# Patient Record
Sex: Male | Born: 1945 | Race: White | Hispanic: No | Smoking: Former smoker
Health system: Southern US, Community
[De-identification: ages and names within clinical notes are randomized; demographics above are authoritative.]

## PROBLEM LIST (undated history)

## (undated) DIAGNOSIS — E785 Hyperlipidemia, unspecified: Secondary | ICD-10-CM

## (undated) DIAGNOSIS — T8859XA Other complications of anesthesia, initial encounter: Secondary | ICD-10-CM

## (undated) DIAGNOSIS — M1990 Unspecified osteoarthritis, unspecified site: Secondary | ICD-10-CM

## (undated) HISTORY — DX: Hyperlipidemia, unspecified: E78.5

## (undated) HISTORY — PX: COLONOSCOPY: SHX174

## (undated) HISTORY — DX: Unspecified osteoarthritis, unspecified site: M19.90

---

## 1989-01-03 HISTORY — PX: NECK SURGERY: SHX720

## 2003-08-14 ENCOUNTER — Ambulatory Visit (HOSPITAL_COMMUNITY): Admission: RE | Admit: 2003-08-14 | Discharge: 2003-08-14 | Payer: Self-pay | Admitting: Gastroenterology

## 2014-02-24 ENCOUNTER — Telehealth: Payer: Self-pay | Admitting: Family Medicine

## 2014-02-24 NOTE — Telephone Encounter (Signed)
Appointment given for tomorrow with Mary Martin, FNP.  

## 2014-02-25 ENCOUNTER — Encounter: Payer: Self-pay | Admitting: Nurse Practitioner

## 2014-02-25 ENCOUNTER — Ambulatory Visit: Payer: PPO

## 2014-02-25 ENCOUNTER — Ambulatory Visit (INDEPENDENT_AMBULATORY_CARE_PROVIDER_SITE_OTHER): Payer: PPO | Admitting: Nurse Practitioner

## 2014-02-25 VITALS — BP 145/85 | HR 92 | Temp 97.6°F | Ht 73.0 in | Wt 247.4 lb

## 2014-02-25 DIAGNOSIS — G629 Polyneuropathy, unspecified: Secondary | ICD-10-CM

## 2014-02-25 DIAGNOSIS — M7661 Achilles tendinitis, right leg: Secondary | ICD-10-CM

## 2014-02-25 MED ORDER — GABAPENTIN 300 MG PO CAPS: 300.0000 mg | ORAL_CAPSULE | Freq: Every day | ORAL | Status: DC

## 2014-02-25 NOTE — Patient Instructions (Signed)
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
## 2014-09-15 ENCOUNTER — Other Ambulatory Visit: Payer: Self-pay | Admitting: Chiropractic Medicine

## 2014-09-15 DIAGNOSIS — M79602 Pain in left arm: Secondary | ICD-10-CM

## 2014-09-15 DIAGNOSIS — M542 Cervicalgia: Secondary | ICD-10-CM

## 2014-09-26 ENCOUNTER — Ambulatory Visit
Admission: RE | Admit: 2014-09-26 | Discharge: 2014-09-26 | Disposition: A | Discharge status: Home / Self Care | Payer: PPO | Source: Ambulatory Visit | Attending: Chiropractic Medicine | Admitting: Chiropractic Medicine

## 2014-09-26 DIAGNOSIS — M79602 Pain in left arm: Secondary | ICD-10-CM

## 2014-09-26 DIAGNOSIS — M542 Cervicalgia: Secondary | ICD-10-CM

## 2014-10-02 NOTE — Progress Notes (Signed)
Subjective:  Patient ID: Fernando Pratt, male    DOB: [birth year]  Age: 69 y.o. MRN: [id number]  CC: Back Pain   HPI Fernando Pratt presents for recent MRI of the cervical spine. This shows a C5-6 and C6-C7 cervical fusion. Apparently performed in 1991. It also shows  mild cervical spinal stenosis and severe bilateral C4 and moderate to severe bilateral C5 neural foraminal stenosis. Also has a right lumbar radiculopathy Pain primarily at left upper shoulder, then shoulder blades. KNees also hurt. Feeling fragile  History Fernando Pratt has no past medical history on file.   He has past surgical history that includes Neck surgery (1991).   His family history is not on file.He reports that he has never smoked. He does not have any smokeless tobacco history on file. He reports that he does not drink alcohol or use illicit drugs.  Outpatient Prescriptions Prior to Visit  Medication Sig Dispense Refill  . gabapentin (NEURONTIN) 300 MG capsule Take 1 capsule (300 mg total) by mouth at bedtime. (Patient not taking: Reported on 10/03/2014) 30 capsule 1   No facility-administered medications prior to visit.    ROS Review of Systems  Constitutional: Negative for fever, chills and diaphoresis.  HENT: Negative for congestion, rhinorrhea and sore throat.   Respiratory: Negative for cough, shortness of breath and wheezing.   Cardiovascular: Negative for chest pain.  Gastrointestinal: Negative for nausea, vomiting, abdominal pain, diarrhea, constipation and abdominal distention.  Genitourinary: Negative for dysuria and frequency.  Musculoskeletal: Negative for joint swelling and arthralgias.  Skin: Negative for rash.  Neurological: Negative for headaches.  All other systems reviewed and are negative.   Objective:  BP 134/87 mmHg  Pulse 88  Temp(Src) 96 F (35.6 C) (Oral)  Ht  (1.854 m)  Wt 238 lb 3.2 oz (108.047 kg)  BMI 31.43 kg/m2  BP Readings from Last 3 Encounters:  10/03/14  134/87  02/25/14 145/85    Wt Readings from Last 3 Encounters:  10/03/14 238 lb 3.2 oz (108.047 kg)  02/25/14 247 lb 6.4 oz (112.22 kg)     Physical Exam  Constitutional: He is oriented to person, place, and time. He appears well-developed and well-nourished. No distress.  HENT:  Head: Normocephalic and atraumatic.  Right Ear: External ear normal.  Left Ear: External ear normal.  Nose: Nose normal.  Mouth/Throat: Oropharynx is clear and moist.  Eyes: Conjunctivae and EOM are normal. Pupils are equal, round, and reactive to light.  Neck: Normal range of motion. Neck supple. No thyromegaly present.  Cardiovascular: Normal rate, regular rhythm and normal heart sounds.   No murmur heard. Pulmonary/Chest: Effort normal and breath sounds normal. No respiratory distress. He has no wheezes. He has no rales.  Abdominal: Soft. Bowel sounds are normal. He exhibits no distension. There is no tenderness.  Lymphadenopathy:    He has no cervical adenopathy.  Neurological: He is alert and oriented to person, place, and time. He has normal reflexes.  Skin: Skin is warm and dry.  Psychiatric: He has a normal mood and affect. His behavior is normal. Judgment and thought content normal.    No results found for: HGBA1C  No results found for: WBC, HGB, HCT, PLT, GLUCOSE, CHOL, TRIG, HDL, LDLDIRECT, LDLCALC, ALT, AST, NA, K, CL, CREATININE, BUN, CO2, TSH, PSA, INR, GLUF, HGBA1C, MICROALBUR  Mr Cervical Spine Wo Contrast  09/26/2014   CLINICAL DATA:  69 year old male with increasing cervical neck pain radiating to the left arm with bilateral upper  extremity weakness. Symptoms for 3 months with no known injury. Remote spine surgery in the 1990s.  EXAM: MRI CERVICAL SPINE WITHOUT CONTRAST  TECHNIQUE: Multiplanar, multisequence MR imaging of the cervical spine was performed. No intravenous contrast was administered.  COMPARISON:  None.  FINDINGS: Previous fusion of the C5 through C7 cervical vertebrae.  Focal lordosis at C4-C5. No marrow edema or evidence of acute osseous abnormality.  Cervicomedullary junction is within normal limits. No cervical spinal cord signal abnormality identified.  Dominant left vertebral artery. No marrow edema or evidence of acute osseous abnormality.  C2-C3: Moderate facet hypertrophy on the right. Mild broad-based central disc protrusion or disc osteophyte complex. Borderline to mild spinal stenosis. No significant foraminal stenosis.  C3-C4: Right eccentric circumferential disc osteophyte complex with moderate facet hypertrophy greater on the right. Spinal stenosis with no cord mass effect. Severe bilateral C4 foraminal stenosis.  C4-C5: Left eccentric circumferential disc osteophyte complex. Moderate facet hypertrophy greater on the right. Spinal stenosis with no cord mass effect. Severe left and moderate right C5 foraminal stenosis.  C5-C6:  Previous fusion.  No significant stenosis.  C6-C7: Previous fusion. Residual uncovertebral hypertrophy and mild to moderate C7 foraminal stenosis.  C7-T1: Anterior eccentric disc osteophyte complex. Mild facet and uncovertebral hypertrophy. Mild right greater than left C8 foraminal stenosis.  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  IMPRESSION: 1. Previous C5-C6 and C6-C7 cervical fusion. 2. Multifactorial mild spinal stenosis with no cord mass effect from C2-C3 to C4-C5. 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   Electronically Signed   By: Odessa Fleming M.D.   On: 09/26/2014 20:18    Assessment & Plan:   [name] was seen today for back pain.  Diagnoses and all orders for this visit:  Pain in joint, shoulder region, left -     Ambulatory referral to Orthopedic Surgery -     Ambulatory referral to Physical Therapy  Knee pain, bilateral -     Ambulatory referral to Orthopedic Surgery -     Ambulatory referral to Physical Therapy  Other  orders -     celecoxib (CELEBREX) 200 MG capsule; Take 1 capsule (200 mg total) by mouth daily. With food -     traZODone (DESYREL) 150 MG tablet; Take 1 tablet (150 mg total) by mouth at bedtime.  I have discontinued Mr. Hilligoss's gabapentin. I am also having him start on celecoxib and traZODone.  Meds ordered this encounter  Medications  . celecoxib (CELEBREX) 200 MG capsule    Sig: Take 1 capsule (200 mg total) by mouth daily. With food    Dispense:  30 capsule    Refill:  5  . traZODone (DESYREL) 150 MG tablet    Sig: Take 1 tablet (150 mg total) by mouth at bedtime.    Dispense:  30 tablet    Refill:  2     Follow-up: Return in about 1 month (around 11/02/2014) for Arthritis, Pain.  Mechele Claude, M.D.

## 2014-10-03 ENCOUNTER — Ambulatory Visit (INDEPENDENT_AMBULATORY_CARE_PROVIDER_SITE_OTHER): Payer: PPO | Admitting: Family Medicine

## 2014-10-03 ENCOUNTER — Encounter: Payer: Self-pay | Admitting: Family Medicine

## 2014-10-03 VITALS — BP 134/87 | HR 88 | Temp 96.0°F | Ht 73.0 in | Wt 238.2 lb

## 2014-10-03 DIAGNOSIS — M25512 Pain in left shoulder: Secondary | ICD-10-CM

## 2014-10-03 DIAGNOSIS — M25562 Pain in left knee: Secondary | ICD-10-CM

## 2014-10-03 DIAGNOSIS — M25561 Pain in right knee: Secondary | ICD-10-CM

## 2014-10-03 MED ORDER — TRAZODONE HCL 150 MG PO TABS: 150.0000 mg | ORAL_TABLET | Freq: Every day | ORAL | Status: DC

## 2014-10-03 MED ORDER — CELECOXIB 200 MG PO CAPS: 200.0000 mg | ORAL_CAPSULE | Freq: Every day | ORAL | Status: DC

## 2014-11-03 ENCOUNTER — Ambulatory Visit (INDEPENDENT_AMBULATORY_CARE_PROVIDER_SITE_OTHER): Payer: PPO | Admitting: Family Medicine

## 2014-11-03 ENCOUNTER — Encounter: Payer: Self-pay | Admitting: Family Medicine

## 2014-11-03 VITALS — BP 137/78 | HR 84 | Temp 96.7°F | Ht 73.0 in | Wt 236.1 lb

## 2014-11-03 DIAGNOSIS — E785 Hyperlipidemia, unspecified: Secondary | ICD-10-CM

## 2014-11-03 DIAGNOSIS — M4712 Other spondylosis with myelopathy, cervical region: Secondary | ICD-10-CM

## 2014-11-03 DIAGNOSIS — M25561 Pain in right knee: Secondary | ICD-10-CM

## 2014-11-03 DIAGNOSIS — M25562 Pain in left knee: Secondary | ICD-10-CM | POA: Diagnosis not present

## 2014-11-03 DIAGNOSIS — Z23 Encounter for immunization: Secondary | ICD-10-CM | POA: Diagnosis not present

## 2014-11-03 DIAGNOSIS — G629 Polyneuropathy, unspecified: Secondary | ICD-10-CM | POA: Diagnosis not present

## 2014-11-03 DIAGNOSIS — M25512 Pain in left shoulder: Secondary | ICD-10-CM | POA: Diagnosis not present

## 2014-11-03 MED ORDER — CELECOXIB 400 MG PO CAPS: 400.0000 mg | ORAL_CAPSULE | Freq: Every day | ORAL | Status: DC

## 2014-11-03 MED ORDER — GABAPENTIN 300 MG PO CAPS: 600.0000 mg | ORAL_CAPSULE | Freq: Two times a day (BID) | ORAL | Status: DC

## 2014-11-03 NOTE — Patient Instructions (Signed)
Start gabapentin with 1 capsule at bedtime. Increase every third or fourth day by one capsule as able monitoring for drowsiness the following morning.   Try to change the dose to 2 capsules twice a day once you are tolerating for at bedtime

## 2014-11-03 NOTE — Progress Notes (Signed)
Subjective:  Patient ID: Fernando Pratt, male    DOB: Nov 14, 1945  Age: 69 y.o. 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 Arms have no strength. Very sore both arms. Shaky with activity. Denies neck pain. Can't sleep due to knee and shoulder pain bilaterally. Can't tighten screws. Saw neurosurg who said he did not need neck surgery. Ortho, Dr. Onnie Graham wants to do NCV. Did shoulder injections that didn't help. Pain at superior to posterior trapezius region. Rated moderately severe. Stable, but not improving.  History [name] has no past medical history on file.   He has past surgical history that includes Neck surgery (1991).   His family history is not on file.He reports that he has never smoked. He does not have any smokeless tobacco history on file. He reports that he does not drink alcohol or use illicit drugs.  Outpatient Prescriptions Prior to Visit  Medication Sig Dispense Refill  . celecoxib (CELEBREX) 200 MG capsule Take 1 capsule (200 mg total) by mouth daily. With food 30 capsule 5  . traZODone (DESYREL) 150 MG tablet Take 1 tablet (150 mg total) by mouth at bedtime. (Patient not taking: Reported on 11/03/2014) 30 tablet 2   No facility-administered medications prior to visit.    ROS Review of Systems  Objective:  BP 137/78 mmHg  Pulse 84  Temp(Src) 96.7 F (35.9 C) (Oral)  Ht 6' 1" (1.854 m)  Wt 236 lb 1.6 oz (107.094 kg)  BMI 31.16 kg/m2  BP Readings from Last 3 Encounters:  11/03/14 137/78  10/03/14 134/87  02/25/14 145/85    Wt Readings from Last 3 Encounters:  11/03/14 236 lb 1.6 oz (107.094 kg)  10/03/14 238 lb 3.2 oz (108.047 kg)  02/25/14 247 lb 6.4 oz (112.22 kg)     Physical Exam  No results found for: HGBA1C  No results found for: WBC, HGB, HCT, PLT, GLUCOSE, CHOL, TRIG, HDL, LDLDIRECT, LDLCALC, ALT,  AST, NA, K, CL, CREATININE, BUN, CO2, TSH, PSA, INR, GLUF, HGBA1C, MICROALBUR  Mr Cervical Spine Wo Contrast  09/26/2014  CLINICAL DATA:  69 year old male with increasing cervical neck pain radiating to the left arm with bilateral upper extremity weakness. Symptoms for 3 months with no known injury. Remote spine surgery in the 1990s. EXAM: MRI CERVICAL SPINE WITHOUT CONTRAST TECHNIQUE: Multiplanar, multisequence MR imaging of the cervical spine was performed. No intravenous contrast was administered. COMPARISON:  None. FINDINGS: Previous fusion of the C5 through C7 cervical vertebrae. Focal lordosis at C4-C5. No marrow edema or evidence of acute osseous abnormality. Cervicomedullary junction is within normal limits. No cervical spinal cord signal abnormality identified. Dominant left vertebral artery. No marrow edema or evidence of acute osseous abnormality. C2-C3: Moderate facet hypertrophy on the right. Mild broad-based central disc protrusion or disc osteophyte complex. Borderline to mild spinal stenosis. No significant foraminal stenosis. C3-C4: Right eccentric circumferential disc osteophyte complex with moderate facet hypertrophy greater on the right. Spinal stenosis with no cord mass effect. Severe bilateral C4 foraminal stenosis. C4-C5: Left eccentric circumferential disc osteophyte complex. Moderate facet hypertrophy greater on the right. Spinal stenosis with no cord mass effect. Severe left and moderate right C5 foraminal stenosis. C5-C6:  Previous fusion.  No significant stenosis. C6-C7: Previous fusion. Residual uncovertebral hypertrophy and mild to moderate C7 foraminal stenosis. C7-T1: Anterior eccentric disc osteophyte complex. Mild facet and uncovertebral hypertrophy. Mild right  greater than left C8 foraminal stenosis. 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 IMPRESSION: 1. Previous C5-C6 and C6-C7 cervical fusion. 2. Multifactorial mild spinal  stenosis with no cord mass effect from C2-C3 to C4-C5. 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 Electronically Signed   By: Genevie Ann M.D.   On: 09/26/2014 20:18    Assessment & Plan:   [name] was seen today for shoulder pain.  Diagnoses and all orders for this visit:  Pain in joint, shoulder region, left -     CBC with Differential/Platelet -     CMP14+EGFR -     Sedimentation rate -     Ambulatory referral to Physical Therapy  Knee pain, bilateral -     CBC with Differential/Platelet -     CMP14+EGFR -     Sedimentation rate -     Ambulatory referral to Physical Therapy  Neuropathy (HCC) -     CBC with Differential/Platelet -     CMP14+EGFR -     Sedimentation rate  Spondylosis, cervical, with myelopathy -     CBC with Differential/Platelet -     CMP14+EGFR -     Sedimentation rate -     Ambulatory referral to Physical Therapy  Hyperlipidemia -     Lipid panel  Other orders -     gabapentin (NEURONTIN) 300 MG capsule; Take 2 capsules (600 mg total) by mouth 2 (two) times daily. -     celecoxib (CELEBREX) 400 MG capsule; Take 1 capsule (400 mg total) by mouth daily. With food   I have changed Mr. Serda's celecoxib. I am also having him start on gabapentin. Additionally, I am having him maintain his traZODone.  Meds ordered this encounter  Medications  . gabapentin (NEURONTIN) 300 MG capsule    Sig: Take 2 capsules (600 mg total) by mouth 2 (two) times daily.    Dispense:  120 capsule    Refill:  1  . celecoxib (CELEBREX) 400 MG capsule    Sig: Take 1 capsule (400 mg total) by mouth daily. With food    Dispense:  30 capsule    Refill:  5     Follow-up: Return in about 6 weeks (around 12/15/2014) for Arthritis, Pain.  Claretta Fraise, M.D.

## 2014-11-04 LAB — CMP14+EGFR
ALBUMIN: 4 g/dL (ref 3.6–4.8)
ALK PHOS: 68 IU/L (ref 39–117)
ALT: 14 IU/L (ref 0–44)
AST: 13 IU/L (ref 0–40)
Albumin/Globulin Ratio: 1.7 (ref 1.1–2.5)
BILIRUBIN TOTAL: 0.6 mg/dL (ref 0.0–1.2)
BUN / CREAT RATIO: 19 (ref 10–22)
BUN: 17 mg/dL (ref 8–27)
CHLORIDE: 101 mmol/L (ref 97–106)
CO2: 24 mmol/L (ref 18–29)
CREATININE: 0.9 mg/dL (ref 0.76–1.27)
Calcium: 8.9 mg/dL (ref 8.6–10.2)
GFR calc Af Amer: 100 mL/min/{1.73_m2} (ref >59)
GFR calc non Af Amer: 87 mL/min/{1.73_m2} (ref >59)
GLUCOSE: 112 mg/dL — AB (ref 65–99)
Globulin, Total: 2.3 g/dL (ref 1.5–4.5)
Potassium: 4.6 mmol/L (ref 3.5–5.2)
Sodium: 140 mmol/L (ref 136–144)
Total Protein: 6.3 g/dL (ref 6.0–8.5)

## 2014-11-04 LAB — CBC WITH DIFFERENTIAL/PLATELET
BASOS ABS: 0 10*3/uL (ref 0.0–0.2)
Basos: 0 %
EOS (ABSOLUTE): 0.1 10*3/uL (ref 0.0–0.4)
Eos: 2 %
Hematocrit: 39.7 % (ref 37.5–51.0)
Hemoglobin: 13.3 g/dL (ref 12.6–17.7)
Immature Grans (Abs): 0 10*3/uL (ref 0.0–0.1)
Immature Granulocytes: 0 %
LYMPHS ABS: 0.9 10*3/uL (ref 0.7–3.1)
Lymphs: 12 %
MCH: 28.4 pg (ref 26.6–33.0)
MCHC: 33.5 g/dL (ref 31.5–35.7)
MCV: 85 fL (ref 79–97)
MONOCYTES: 8 %
MONOS ABS: 0.7 10*3/uL (ref 0.1–0.9)
NEUTROS ABS: 6.1 10*3/uL (ref 1.4–7.0)
Neutrophils: 78 %
PLATELETS: 161 10*3/uL (ref 150–379)
RBC: 4.68 x10E6/uL (ref 4.14–5.80)
RDW: 13.3 % (ref 12.3–15.4)
WBC: 7.9 10*3/uL (ref 3.4–10.8)

## 2014-11-04 LAB — SEDIMENTATION RATE: SED RATE: 11 mm/h (ref 0–30)

## 2014-11-04 LAB — LIPID PANEL
CHOLESTEROL TOTAL: 192 mg/dL (ref 100–199)
Chol/HDL Ratio: 4.2 ratio units (ref 0.0–5.0)
HDL: 46 mg/dL (ref >39)
LDL Calculated: 133 mg/dL — ABNORMAL HIGH (ref 0–99)
Triglycerides: 64 mg/dL (ref 0–149)
VLDL CHOLESTEROL CAL: 13 mg/dL (ref 5–40)

## 2014-11-04 MED ORDER — ATORVASTATIN CALCIUM 40 MG PO TABS: 40.0000 mg | ORAL_TABLET | Freq: Every day | ORAL | Status: DC

## 2014-11-04 NOTE — Addendum Note (Signed)
Addended by: Mechele ClaudeSTACKS, Aaryanna Hyden on: 11/04/2014 06:31 PM   Modules accepted: Kipp BroodSmartSet

## 2014-11-04 NOTE — Addendum Note (Signed)
Addended by: Tamera PuntWRAY, Shaquanna Lycan S on: 11/04/2014 01:22 PM   Modules accepted: Orders

## 2014-11-10 ENCOUNTER — Encounter: Payer: Self-pay | Admitting: Physical Therapy

## 2014-11-10 ENCOUNTER — Ambulatory Visit: Payer: PPO | Attending: Family Medicine | Admitting: Physical Therapy

## 2014-11-10 DIAGNOSIS — M25512 Pain in left shoulder: Secondary | ICD-10-CM | POA: Insufficient documentation

## 2014-11-10 DIAGNOSIS — M25511 Pain in right shoulder: Secondary | ICD-10-CM | POA: Insufficient documentation

## 2014-11-10 DIAGNOSIS — M25611 Stiffness of right shoulder, not elsewhere classified: Secondary | ICD-10-CM | POA: Insufficient documentation

## 2014-11-10 DIAGNOSIS — R29898 Other symptoms and signs involving the musculoskeletal system: Secondary | ICD-10-CM

## 2014-11-10 DIAGNOSIS — R6889 Other general symptoms and signs: Secondary | ICD-10-CM

## 2014-11-10 DIAGNOSIS — M25612 Stiffness of left shoulder, not elsewhere classified: Secondary | ICD-10-CM | POA: Diagnosis present

## 2014-11-10 NOTE — Therapy (Signed)
[facility] [address] [place], [place], [address] Phone: [phone]   Fax:  [phone]  Physical Therapy Evaluation  Patient Details  Name: Fernando Pratt MRN: [id number] Date of Birth: [birth year] Referring Provider: Mechele ClaudeWarren Stacks, MD  Encounter Date: 11/10/2014      PT End of Session - 11/10/14 1349    Visit Number 1   Number of Visits 16   Date for PT Re-Evaluation 01/05/15   PT Start Time 1349   PT Stop Time 1430   PT Time Calculation (min) 41 min   Activity Tolerance Patient tolerated treatment well   Behavior [facility] Therapy [facility] for tasks assessed/performed      History reviewed. No pertinent past medical history.  Past Surgical History  Procedure Laterality Date  . Neck surgery  1991    There were no vitals filed for this visit.  Visit Diagnosis:  Bilateral shoulder pain - Plan: PT plan of care cert/re-cert  Stiffness of shoulder joint, left - Plan: PT plan of care cert/re-cert  Stiffness of shoulder joint, right - Plan: PT plan of care cert/re-cert  Weakness of both arms - Plan: PT plan of care cert/re-cert  Activity intolerance - Plan: PT plan of care cert/re-cert      Subjective Assessment - 11/10/14 1347    Subjective Patient began experiencing Bshoulder pain (L > R) beginning 4 months ago. Also reports feeling jittery in his entire body as well as shaking in BUE. Patient states Dr. Aundra MilletNeudlelman doesn't think the neck is causing his shoulder pain. Saw Dr. Rennis ChrisSupple about a monthe ago and had a shot in B shoulders which eliminated pain and improved movement for 3 days.   Pertinent History C5/6 and C6/7 fusion   Diagnostic tests MRI 09/26/14: C5/6/7 fusion; severe B C4 and mod to severe B C5 neural foraminal stenosis   Currently in Pain? 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   Multiple Pain Sites Yes   Pain Score 5   Pain Location Knee   Pain Orientation Left  L constant; R intermittent   Pain Descriptors / Indicators Aching   Pain Type Chronic pain   Pain Onset More than a month ago   Pain Frequency Intermittent   Aggravating Factors  squatting   Pain Relieving Factors bengay   Effect of Pain on Daily Activities difficulty working            [facility] - 11/10/14 0001    Assessment   Medical Diagnosis L shoulder pain; B knee pain   Referring Provider Mechele ClaudeWarren Stacks, MD   Onset Date/Surgical Date 07/10/14   Next MD Visit 3 weeks   Prior Therapy none   Precautions   Precaution Comments --   Balance Screen   Has the patient fallen in the past 6 months No   Has the patient had a decrease in activity level because of a fear of falling?  Yes   Is the patient reluctant to leave their home because of a fear of falling?  No   Prior Function   Vocation Part time employment  does  what he can; limited by pain   Observation/Other Assessments   Focus on Therapeutic Outcomes (FOTO)  57% limitation   Posture/Postural Control   Posture Comments rounded shoulders, forward head   ROM / Strength   AROM / PROM / Strength AROM;PROM;Strength   AROM   AROM Assessment Site Shoulder   Right/Left Shoulder Right;Left   Right Shoulder Flexion 155 Degrees   Right Shoulder ABduction 160 Degrees   Right Shoulder Internal Rotation --  to top of buttock   Right Shoulder External Rotation 54 Degrees   Left Shoulder Flexion 127 Degrees   Left Shoulder ABduction 90 Degrees   Left Shoulder Internal Rotation --  to top of buttock   Left Shoulder External Rotation 35 Degrees   PROM   PROM Assessment Site Shoulder   Right/Left Shoulder Right;Left   Right Shoulder Flexion 162 Degrees    Right Shoulder ABduction --  full   Right Shoulder External Rotation 70 Degrees   Left Shoulder Flexion 142 Degrees   Left Shoulder ABduction 120 Degrees   Left Shoulder External Rotation 40 Degrees   Strength   Overall Strength Comments L grip 1.75 kg ; R .05 Kg; B knees 5/5; B hip flex 5/5   Strength Assessment Site Shoulder   Right/Left Shoulder Right;Left   Right Shoulder Flexion 5/5   Right Shoulder Extension 5/5   Right Shoulder ABduction 4-/5  pain   Right Shoulder Internal Rotation 5/5   Right Shoulder External Rotation 4+/5   Left Shoulder Flexion 4+/5   Left Shoulder Extension 5/5   Left Shoulder ABduction 4-/5  pain   Left Shoulder Internal Rotation 5/5   Left Shoulder External Rotation 4+/5   Palpation   Palpation comment anterior shoulders B   Special Tests    Special Tests Rotator Cuff Impingement   Rotator Cuff Impingment tests Leanord Asal test;Neer impingement test;Lift- off test   Neer Impingement test    Findings Positive   Side Left   Hawkins-Kennedy test   Findings Positive   Side Left   Lift-Off test   Findings Positive   Side Left                           PT Education - 11/10/14 1543    Education provided Yes   Education Details HEP   Person(s) Educated Patient   Methods Demonstration;Explanation;Handout   Comprehension Verbalized understanding;Returned demonstration          PT Short Term Goals - 11/10/14 1544    PT SHORT TERM GOAL #1   Title I with initial HEP 11/24/14   Time 2   Period Weeks   Status New           PT Long Term Goals - 11/10/14 1545    PT LONG TERM GOAL #1   Title I with advanced HEP   Time 8   Period Weeks   Status New   PT LONG TERM GOAL #2   Title Improved L shoulder ROM to [facility] to peform ADLs   Time 8   Period Weeks   Status New   PT LONG TERM GOAL #3   Title Improved shoulder strength to 5-/5 B   Time 8   Period Weeks   Status New   PT LONG TERM GOAL #4   Title  decreased pain with ADLS to 3/10 or less in B shoulders   PT LONG TERM GOAL #5   Title improved grip  strength by 20# B   Time 8   Period Weeks               Plan - November 19, 2014 1434    Clinical Impression Statement Patient is a 69 year old male with insidious onset 4 months ago of B shoulder pain L > R. His shoulder pain has been ruled out as being neurological by his neurosurgeon. He has limited ROM and strength especially his grip strength which is minimal B. He also reports B knee pain.   Pt will benefit from skilled therapeutic intervention in order to improve on the following deficits Decreased range of motion;Impaired UE functional use;Decreased activity tolerance;Pain;Postural dysfunction;Decreased strength   Rehab Potential Good   Clinical Impairments Affecting Rehab Potential weak grip strength   PT Frequency 2x / week   PT Duration 8 weeks   PT Treatment/Interventions ADLs/Self Care Home Management;Cryotherapy;Electrical Stimulation;Moist Heat;Therapeutic exercise;Ultrasound;Neuromuscular re-education;Patient/family education;Manual techniques;Vasopneumatic Device;Passive range of motion   PT Next Visit Plan Review cane exercises, add shoulder and scapular strengthening, grip strength.   PT Home Exercise Plan supine cane flex, er, chest press   Consulted and Agree with Plan of Care Patient          G-Codes - November 19, 2014 1549    Functional Assessment Tool Used FOTO 57% Limited   Functional Limitation Other PT primary   Other PT Primary Current Status (A5409) At least 40 percent but less than 60 percent impaired, limited or restricted   Other PT Primary Goal Status (W1191) At least 20 percent but less than 40 percent impaired, limited or restricted       Problem List There are no active problems to display for this patient.   Solon Palm PT  2014/11/19, 3:53 PM  [facility] [address] [place], [place], [address] Phone:  [phone]   Fax:  [phone]  Name: Fernando Pratt MRN: [id number] Date of Birth: 05/08/1945

## 2014-11-14 ENCOUNTER — Encounter: Payer: Self-pay | Admitting: *Deleted

## 2014-11-14 ENCOUNTER — Ambulatory Visit: Payer: PPO | Admitting: *Deleted

## 2014-11-14 DIAGNOSIS — R6889 Other general symptoms and signs: Secondary | ICD-10-CM

## 2014-11-14 DIAGNOSIS — M25511 Pain in right shoulder: Secondary | ICD-10-CM | POA: Diagnosis not present

## 2014-11-14 DIAGNOSIS — M25611 Stiffness of right shoulder, not elsewhere classified: Secondary | ICD-10-CM

## 2014-11-14 DIAGNOSIS — R29898 Other symptoms and signs involving the musculoskeletal system: Secondary | ICD-10-CM

## 2014-11-14 DIAGNOSIS — M25512 Pain in left shoulder: Principal | ICD-10-CM

## 2014-11-14 DIAGNOSIS — M25612 Stiffness of left shoulder, not elsewhere classified: Secondary | ICD-10-CM

## 2014-11-14 NOTE — Therapy (Signed)
[facility] [address] [place], [place], [address] Phone: [phone]   Fax:  [phone]  Physical Therapy Treatment  Patient Details  Name: Fernando Pratt MRN: [id number] Date of Birth: 17-Nov-1945 Referring Provider: Mechele ClaudeWarren Stacks, MD  Encounter Date: 11/14/2014      PT End of Session - 11/14/14 0931    Visit Number 2   Number of Visits 16   Date for PT Re-Evaluation 01/05/15   PT Start Time 0815   PT Stop Time 0905   PT Time Calculation (min) 50 min      History reviewed. No pertinent past medical history.  Past Surgical History  Procedure Laterality Date  . Neck surgery  1991    There were no vitals filed for this visit.  Visit Diagnosis:  Bilateral shoulder pain  Stiffness of shoulder joint, left  Stiffness of shoulder joint, right  Weakness of both arms  Activity intolerance      Subjective Assessment - 11/14/14 0820    Subjective Patient began experiencing Bshoulder pain (L > R) beginning 4 months ago. Also reports feeling jittery in his entire body as well as shaking in BUE. Patient states Dr. Aundra MilletNeudlelman doesn't think the neck is causing his shoulder pain. Saw Dr. Rennis ChrisSupple about a monthe ago and had a shot in B shoulders which eliminated pain and improved movement for 3 days.   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 Yes   Pain Score 8    Pain Orientation Right;Left   Pain Onset More than a month ago   Pain Frequency Constant                         [facility] Adult PT Treatment/Exercise - 11/14/14 0001    Exercises   Exercises Shoulder   Shoulder Exercises: Supine   Other Supine Exercises supine cane exs. : press up, flexion, and er stretch   Shoulder Exercises: Standing   External Rotation Strengthening;Both;20 reps   Theraband Level (Shoulder  External Rotation) Level 1 (Yellow)   Modalities   Modalities Electrical Stimulation   Electrical Stimulation   Electrical Stimulation Location BIL. shldrs premod x 15 mins 80-150 hz supine   Electrical Stimulation Goals Pain   Manual Therapy   Manual Therapy Passive ROM   Passive ROM PROM to BIL. shldrs for ER f/b Rhythmic stab in supine                  PT Short Term Goals - 11/10/14 1544    PT SHORT TERM GOAL #1   Title I with initial HEP 11/24/14   Time 2   Period Weeks   Status New           PT Long Term Goals - 11/10/14 1545    PT LONG TERM GOAL #1   Title I with advanced HEP   Time 8   Period Weeks   Status New   PT LONG TERM GOAL #2   Title Improved L shoulder ROM to [facility] to peform ADLs   Time 8   Period Weeks   Status New   PT LONG TERM GOAL #3   Title Improved shoulder strength to 5-/5 B   Time 8   Period Weeks   Status New   PT LONG TERM GOAL #4  Title decreased pain with ADLS to 3/10 or less in B shoulders   PT LONG TERM GOAL #5   Title improved grip strength by 20# B   Time 8   Period Weeks               Plan - 11/14/14 1006    Clinical Impression Statement Pt did fairly well with Exs and manual Therapy. He was able to perform Tband Er with minimal pain increase and hold on to the handle ok (weak grip). Reviewed Cane exs with Pt and had to modify Er stretch by putting two pillows under elbows for a better shldr position. Goals are on-going    Pt will benefit from skilled therapeutic intervention in order to improve on the following deficits Decreased range of motion;Impaired UE functional use;Decreased activity tolerance;Pain;Postural dysfunction;Decreased strength   Clinical Impairments Affecting Rehab Potential weak grip strength   PT Frequency 2x / week   PT Duration 8 weeks   PT Treatment/Interventions ADLs/Self Care Home Management;Cryotherapy;Electrical Stimulation;Moist Heat;Therapeutic exercise;Ultrasound;Neuromuscular  re-education;Patient/family education;Manual techniques;Vasopneumatic Device;Passive range of motion   PT Next Visit Plan Review cane exercises, add shoulder and scapular strengthening, grip strength.   PT Home Exercise Plan supine cane flex, er, chest press   Consulted and Agree with Plan of Care Patient        Problem List There are no active problems to display for this patient.   Fernando Pratt,Fernando Pratt, PTA 11/14/2014, 10:17 AM  [facility] [address] [place], [place], [address] Phone: [phone]   Fax:  [phone]  Name: Fernando Pratt MRN: [id number] Date of Birth: 17-Nov-1945

## 2014-11-17 ENCOUNTER — Encounter: Payer: PPO | Admitting: Physical Therapy

## 2014-11-19 ENCOUNTER — Ambulatory Visit: Payer: PPO | Admitting: Physical Therapy

## 2014-11-19 DIAGNOSIS — M25612 Stiffness of left shoulder, not elsewhere classified: Secondary | ICD-10-CM

## 2014-11-19 DIAGNOSIS — M25611 Stiffness of right shoulder, not elsewhere classified: Secondary | ICD-10-CM

## 2014-11-19 DIAGNOSIS — M25511 Pain in right shoulder: Secondary | ICD-10-CM

## 2014-11-19 DIAGNOSIS — R6889 Other general symptoms and signs: Secondary | ICD-10-CM

## 2014-11-19 DIAGNOSIS — M25512 Pain in left shoulder: Principal | ICD-10-CM

## 2014-11-19 DIAGNOSIS — R29898 Other symptoms and signs involving the musculoskeletal system: Secondary | ICD-10-CM

## 2014-11-19 NOTE — Therapy (Signed)
[facility] [address] [place], [place], [address] Phone: [phone]   Fax:  [phone]  Physical Therapy Treatment  Patient Details  Name: Fernando Pratt MRN: [id number] Date of Birth: Jan 30, 1945 Referring Provider: Mechele ClaudeWarren Stacks, MD  Encounter Date: 11/19/2014      PT End of Session - 11/19/14 1222    Visit Number 3   Number of Visits 16   Date for PT Re-Evaluation 01/05/15   PT Start Time 1034   PT Stop Time 1119   PT Time Calculation (min) 45 min   Activity Tolerance Patient tolerated treatment well   Behavior During Therapy [facility] for tasks assessed/performed      No past medical history on file.  Past Surgical History  Procedure Laterality Date  . Neck surgery  1991    There were no vitals filed for this visit.  Visit Diagnosis:  Bilateral shoulder pain  Stiffness of shoulder joint, left  Stiffness of shoulder joint, right  Weakness of both arms  Activity intolerance      Subjective Assessment - 11/19/14 1118    Subjective (p) I really hurt all over my body and feel weak.   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 (p) Yes   Pain Score (p) 8    Pain Location (p) Shoulder   Pain Orientation (p) Right;Left   Pain Type (p) Acute pain   Pain Onset (p) More than a month ago                                   PT Short Term Goals - 11/10/14 1544    PT SHORT TERM GOAL #1   Title I with initial HEP 11/24/14   Time 2   Period Weeks   Status New           PT Long Term Goals - 11/10/14 1545    PT LONG TERM GOAL #1   Title I with advanced HEP   Time 8   Period Weeks   Status New   PT LONG TERM GOAL #2   Title Improved L shoulder ROM to [facility] to peform ADLs   Time 8   Period Weeks   Status New   PT LONG TERM GOAL #3   Title Improved shoulder strength to 5-/5 B   Time 8   Period  Weeks   Status New   PT LONG TERM GOAL #4   Title decreased pain with ADLS to 3/10 or less in B shoulders   PT LONG TERM GOAL #5   Title improved grip strength by 20# B   Time 8   Period Weeks     Treatment:  UBE x 8 minutes f/b pulleys x 6 minutes f/b bilateral shoulder PROM x 10 minutes f/b constant pre-mod e'stim x 15 minutes at 80-150 HZ.  atient tolerated treatment without complaint.          Problem List There are no active problems to display for this patient.   Amara Manalang, Italy[place]AD  MPT  11/19/2014, 12:29 PM  [facility] [address] [place], [place], [address] Phone: [phone]   Fax:  [phone]  Name: Fernando Pratt MRN: [id number] Date of Birth: Jan 30, 1945

## 2014-11-21 ENCOUNTER — Encounter: Payer: Self-pay | Admitting: Physical Therapy

## 2014-11-21 ENCOUNTER — Ambulatory Visit: Payer: PPO | Admitting: Physical Therapy

## 2014-11-21 DIAGNOSIS — M25511 Pain in right shoulder: Secondary | ICD-10-CM

## 2014-11-21 DIAGNOSIS — R6889 Other general symptoms and signs: Secondary | ICD-10-CM

## 2014-11-21 DIAGNOSIS — M25512 Pain in left shoulder: Principal | ICD-10-CM

## 2014-11-21 DIAGNOSIS — M25611 Stiffness of right shoulder, not elsewhere classified: Secondary | ICD-10-CM

## 2014-11-21 DIAGNOSIS — M25612 Stiffness of left shoulder, not elsewhere classified: Secondary | ICD-10-CM

## 2014-11-21 DIAGNOSIS — R29898 Other symptoms and signs involving the musculoskeletal system: Secondary | ICD-10-CM

## 2014-11-21 NOTE — Therapy (Signed)
[facility] [address] [place], [place], [address] Phone: [phone]   Fax:  [phone]  Physical Therapy Treatment  Patient Details  Name: Fernando Pratt MRN: [id number] Date of Birth: 1945-11-19 Referring Provider: Mechele Claude, MD  Encounter Date: 11/21/2014      PT End of Session - 11/21/14 0734    Visit Number 4   Number of Visits 16   Date for PT Re-Evaluation 01/05/15   PT Start Time 0732   PT Stop Time 0817   PT Time Calculation (min) 45 min   Activity Tolerance Patient tolerated treatment well   Behavior During Therapy [facility] for tasks assessed/performed      No past medical history on file.  Past Surgical History  Procedure Laterality Date  . Neck surgery  1991    There were no vitals filed for this visit.  Visit Diagnosis:  Bilateral shoulder pain  Stiffness of shoulder joint, left  Stiffness of shoulder joint, right  Weakness of both arms  Activity intolerance      Subjective Assessment - 11/21/14 0733    Subjective Patient reports B shoulders feel about the same today. Reports that pain decreases with activity.   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 Yes   Pain Score 3    Pain Location Shoulder   Pain Orientation Right;Left   Pain Type Acute pain   Pain Onset More than a month ago            [facility] PT Assessment - 11/21/14 0001    Assessment   Medical Diagnosis L shoulder pain; B knee pain   Onset Date/Surgical Date 07/10/14   Next MD Visit 3 weeks   Prior Therapy none                     [facility] Adult PT Treatment/Exercise - 11/21/14 0001    Shoulder Exercises: Supine   Protraction AAROM;Both;20 reps   External Rotation AAROM;Both  x30 reps   Flexion AAROM;Both;20 reps   Shoulder Exercises: Standing   External Rotation  Strengthening;Both;20 reps   Theraband Level (Shoulder External Rotation) Level 1 (Yellow)   Row Strengthening;Both;20 reps;Theraband   Theraband Level (Shoulder Row) Level 1 (Yellow)   Retraction Strengthening;Both;20 reps;Theraband   Theraband Level (Shoulder Retraction) Level 1 (Yellow)   Shoulder Exercises: Pulleys   Flexion Other (comment)  x5 min   Shoulder Exercises: ROM/Strengthening   UBE (Upper Arm Bike) 120 RPM x6 min   Modalities   Modalities Electrical Stimulation   Electrical Stimulation   Electrical Stimulation Location B shoulder   Electrical Stimulation Action Pre-Mod   Electrical Stimulation Parameters 80-150 Hz x15 min   Electrical Stimulation Goals Pain                  PT Short Term Goals - 11/21/14 0808    PT SHORT TERM GOAL #1   Title I with initial HEP 11/24/14   Time 2   Period Weeks   Status Achieved           PT Long Term Goals - 11/10/14 1545    PT LONG TERM GOAL #1   Title I with advanced HEP   Time 8   Period Weeks   Status New   PT LONG TERM GOAL #2   Title Improved L  shoulder ROM to [facility] to peform ADLs   Time 8   Period Weeks   Status New   PT LONG TERM GOAL #3   Title Improved shoulder strength to 5-/5 B   Time 8   Period Weeks   Status New   PT LONG TERM GOAL #4   Title decreased pain with ADLS to 3/10 or less in B shoulders   PT LONG TERM GOAL #5   Title improved grip strength by 20# B   Time 8   Period Weeks               Plan - 11/21/14 0802    Clinical Impression Statement Patient tolerated today's treatment fairly well although he continues to have greater difficulty with LUE exercises. Demonstrated decreased grip around the handle and cane during exercises due to B hand swelling and patient contiinues to experience pain in B 3-4th phalanges. Goals remain on-going secondary to decreased B shoulder ROM, strength and grip strength. Normal modaliites response noted following removal of the modalities.  Denied B shoulder pain following today's treatment.   Pt will benefit from skilled therapeutic intervention in order to improve on the following deficits Decreased range of motion;Impaired UE functional use;Decreased activity tolerance;Pain;Postural dysfunction;Decreased strength   Rehab Potential Good   Clinical Impairments Affecting Rehab Potential weak grip strength   PT Frequency 2x / week   PT Duration 8 weeks   PT Treatment/Interventions ADLs/Self Care Home Management;Cryotherapy;Electrical Stimulation;Moist Heat;Therapeutic exercise;Ultrasound;Neuromuscular re-education;Patient/family education;Manual techniques;Vasopneumatic Device;Passive range of motion   PT Next Visit Plan Review cane exercises, add shoulder and scapular strengthening, grip strength.   PT Home Exercise Plan supine cane flex, er, chest press   Consulted and Agree with Plan of Care Patient        Problem List There are no active problems to display for this patient.   Evelene CroonKelsey M Parsons, PTA 11/21/2014, 8:21 AM  [facility] [address] [place], [place], [address] Phone: [phone]   Fax:  [phone]  Name: Fernando KehrRichard B Helminiak MRN: [id number] Date of Birth: Feb 14, 1945

## 2014-11-24 ENCOUNTER — Ambulatory Visit: Payer: PPO | Admitting: Physical Therapy

## 2014-11-24 ENCOUNTER — Encounter: Payer: Self-pay | Admitting: Physical Therapy

## 2014-11-24 DIAGNOSIS — R6889 Other general symptoms and signs: Secondary | ICD-10-CM

## 2014-11-24 DIAGNOSIS — R29898 Other symptoms and signs involving the musculoskeletal system: Secondary | ICD-10-CM

## 2014-11-24 DIAGNOSIS — M25612 Stiffness of left shoulder, not elsewhere classified: Secondary | ICD-10-CM

## 2014-11-24 DIAGNOSIS — M25511 Pain in right shoulder: Secondary | ICD-10-CM | POA: Diagnosis not present

## 2014-11-24 DIAGNOSIS — M25611 Stiffness of right shoulder, not elsewhere classified: Secondary | ICD-10-CM

## 2014-11-24 DIAGNOSIS — M25512 Pain in left shoulder: Principal | ICD-10-CM

## 2014-11-24 NOTE — Therapy (Signed)
[facility] [address] [place], [place], [address] Phone: [phone]   Fax:  [phone]  Physical Therapy Treatment  Patient Details  Name: Fernando Pratt MRN: [id number] Date of Birth: April 03, 1945 Referring Provider: Mechele ClaudeWarren Stacks, MD  Encounter Date: 11/24/2014      PT End of Session - 11/24/14 1601    Visit Number 5   Number of Visits 16   Date for PT Re-Evaluation 01/05/15   PT Start Time 1600   PT Stop Time 1648   PT Time Calculation (min) 48 min   Activity Tolerance Patient tolerated treatment well   Behavior During Therapy [facility] for tasks assessed/performed      No past medical history on file.  Past Surgical History  Procedure Laterality Date  . Neck surgery  1991    There were no vitals filed for this visit.  Visit Diagnosis:  Bilateral shoulder pain  Stiffness of shoulder joint, left  Stiffness of shoulder joint, right  Weakness of both arms  Activity intolerance      Subjective Assessment - 11/24/14 1601    Subjective Reports he had nerve conduction test today and he was told he B carpel tunnel.   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 No/denies            [facility] Assessment - 11/24/14 0001    Assessment   Medical Diagnosis L shoulder pain; B knee pain   Onset Date/Surgical Date 07/10/14   Prior Therapy none                     [facility] Adult PT Treatment/Exercise - 11/24/14 0001    Exercises   Exercises Shoulder;Hand   Shoulder Exercises: Supine   Protraction AAROM;Both;20 reps   External Rotation AAROM;Both;20 reps   Flexion AAROM;Both;20 reps   Shoulder Exercises: Standing   External Rotation Strengthening;Both;20 reps   Theraband Level (Shoulder External Rotation) Level 1 (Yellow)   Extension AAROM;Both;20 reps   Row  Strengthening;Both;20 reps;Theraband   Theraband Level (Shoulder Row) Level 1 (Yellow)   Retraction Strengthening;Both;20 reps;Theraband   Theraband Level (Shoulder Retraction) Level 1 (Yellow)   Shoulder Exercises: Pulleys   Flexion Other (comment)  x5 min   Shoulder Exercises: ROM/Strengthening   UBE (Upper Arm Bike) 120 RPM x6 min   Hand Exercises   Digiticizer Red B x20 reps each   Other Hand Exercises B red web grip x20 reps   Modalities   Modalities Electrical Stimulation   Electrical Stimulation   Electrical Stimulation Location B shoulder   Electrical Stimulation Action Pre-Mod   Electrical Stimulation Parameters 80-150 Hz x15 min   Electrical Stimulation Goals Pain                  PT Short Term Goals - 11/21/14 0808    PT SHORT TERM GOAL #1   Title I with initial HEP 11/24/14   Time 2   Period Weeks   Status Achieved           PT Long Term Goals - 11/10/14 1545    PT LONG TERM GOAL #1   Title I with advanced HEP   Time 8   Period Weeks   Status New   PT LONG TERM GOAL #2   Title Improved L shoulder ROM to [facility] to  peform ADLs   Time 8   Period Weeks   Status New   PT LONG TERM GOAL #3   Title Improved shoulder strength to 5-/5 B   Time 8   Period Weeks   Status New   PT LONG TERM GOAL #4   Title decreased pain with ADLS to 3/10 or less in B shoulders   PT LONG TERM GOAL #5   Title improved grip strength by 20# B   Time 8   Period Weeks               Plan - 11/24/14 1634    Clinical Impression Statement Patient tolerated today's treatment fairly well today although he continues to have greater difficulty with LUE activities than with RUE activites. Continues to present with increased B hand sweliing across B MTP joints. Continues to demonstrate decreased B grip strength around cane for AAROM activities and also with grip activities. Had increased difficulty with L row and extensions today in standng. Multimodal cues were given for  correct trunk position but due to difficulty correct position could no be obtained. L shoulder does not flex as much as RUE during AAROM flexion in supine. Normal modaliites response noted following removal of the modalities.  Experienced B shoulders "feeling good" following today's treatment but gave no numerical rating.   Pt will benefit from skilled therapeutic intervention in order to improve on the following deficits Decreased range of motion;Impaired UE functional use;Decreased activity tolerance;Pain;Postural dysfunction;Decreased strength   Rehab Potential Good   Clinical Impairments Affecting Rehab Potential weak grip strength   PT Frequency 2x / week   PT Duration 8 weeks   PT Treatment/Interventions ADLs/Self Care Home Management;Cryotherapy;Electrical Stimulation;Moist Heat;Therapeutic exercise;Ultrasound;Neuromuscular re-education;Patient/family education;Manual techniques;Vasopneumatic Device;Passive range of motion   PT Next Visit Plan Review cane exercises, add shoulder and scapular strengthening, grip strength.   PT Home Exercise Plan supine cane flex, er, chest press   Consulted and Agree with Plan of Care Patient        Problem List There are no active problems to display for this patient.   [name], PTA 11/24/2014, 4:51 PM  [facility] [address] [place], [place], [address] Phone: [phone]   Fax:  [phone]  Name: Fernando Pratt MRN: [id number] Date of Birth: September 24, 1945

## 2014-11-26 ENCOUNTER — Ambulatory Visit (INDEPENDENT_AMBULATORY_CARE_PROVIDER_SITE_OTHER): Payer: PPO | Admitting: Pediatrics

## 2014-11-26 DIAGNOSIS — M542 Cervicalgia: Secondary | ICD-10-CM
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
## 2014-12-02 ENCOUNTER — Encounter: Payer: Self-pay | Admitting: Physical Therapy

## 2014-12-02 ENCOUNTER — Ambulatory Visit: Payer: PPO | Admitting: Physical Therapy

## 2014-12-02 DIAGNOSIS — M25511 Pain in right shoulder: Secondary | ICD-10-CM | POA: Diagnosis not present

## 2014-12-02 DIAGNOSIS — R6889 Other general symptoms and signs: Secondary | ICD-10-CM

## 2014-12-02 DIAGNOSIS — R29898 Other symptoms and signs involving the musculoskeletal system: Secondary | ICD-10-CM

## 2014-12-02 DIAGNOSIS — M25512 Pain in left shoulder: Principal | ICD-10-CM

## 2014-12-02 DIAGNOSIS — M25611 Stiffness of right shoulder, not elsewhere classified: Secondary | ICD-10-CM

## 2014-12-02 DIAGNOSIS — M25612 Stiffness of left shoulder, not elsewhere classified: Secondary | ICD-10-CM

## 2014-12-02 NOTE — Therapy (Signed)
[facility] [address] [place], [place], [address] Phone: [phone]   Fax:  [phone]  Physical Therapy Treatment  Patient Details  Name: Fernando Pratt MRN: [id number] Date of Birth: 09-28-1945 Referring Provider: Mechele ClaudeWarren Stacks, MD  Encounter Date: 12/02/2014      PT End of Session - 12/02/14 0737    Visit Number 6   Number of Visits 16   Date for PT Re-Evaluation 01/05/15   PT Start Time 0732   PT Stop Time 0817   PT Time Calculation (min) 45 min   Activity Tolerance Patient tolerated treatment well   Behavior [facility] Therapy [facility] for tasks assessed/performed      No past medical history on file.  Past Surgical History  Procedure Laterality Date  . Neck surgery  1991    There were no vitals filed for this visit.  Visit Diagnosis:  Bilateral shoulder pain  Stiffness of shoulder joint, left  Stiffness of shoulder joint, right  Weakness of both arms  Activity intolerance      Subjective Assessment - 12/02/14 0734    Subjective Reports that his hands are still swollen but are better.   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 Yes   Pain Score 3    Pain Location Shoulder   Pain Orientation Right;Left   Pain Descriptors / Indicators Aching   Pain Type Acute pain   Pain Onset More than a month ago            [facility] - 12/02/14 0001    Assessment   Medical Diagnosis L shoulder pain; B knee pain   Onset Date/Surgical Date 07/10/14   Prior Therapy none                     [facility] Adult PT Treatment/Exercise - 12/02/14 0001    Shoulder Exercises: Supine   Flexion Strengthening;Both  3x10 reps   Shoulder Exercises: Standing   External Rotation Strengthening;Both;Theraband  3x10 reps   Theraband Level (Shoulder External Rotation) Level 2  (Red)   Internal Rotation Strengthening;Both;Theraband  3x10 reps   Theraband Level (Shoulder Internal Rotation) Level 2 (Red)   Extension Strengthening;Both;Theraband  3x10 reps   Theraband Level (Shoulder Extension) Level 2 (Red)   Extension Limitations AAROM extension in standing x30 reps   Row Strengthening;Both;Theraband  3x10 reps   Theraband Level (Shoulder Row) Level 2 (Red)   Other Standing Exercises B wall slides x20 reps each   Shoulder Exercises: ROM/Strengthening   UBE (Upper Arm Bike) 120 RPM x6 min   Hand Exercises   Digiticizer Red B x30 reps each   Other Hand Exercises B red web grip x30 reps   Modalities   Modalities Electrical Stimulation   Electrical Stimulation   Electrical Stimulation Location B shoulder   Electrical Stimulation Action Pre-Mod   Electrical Stimulation Parameters 80-150 Hz x15 min   Electrical Stimulation Goals Pain                  PT Short Term Goals - 11/21/14 0808    PT SHORT TERM GOAL #1   Title I with initial HEP 11/24/14   Time 2   Period Weeks   Status Achieved           PT Long Term Goals - 11/10/14 1545  PT LONG TERM GOAL #1   Title I with advanced HEP   Time 8   Period Weeks   Status New   PT LONG TERM GOAL #2   Title Improved L shoulder ROM to [facility] to peform ADLs   Time 8   Period Weeks   Status New   PT LONG TERM GOAL #3   Title Improved shoulder strength to 5-/5 B   Time 8   Period Weeks   Status New   PT LONG TERM GOAL #4   Title decreased pain with ADLS to 3/10 or less in B shoulders   PT LONG TERM GOAL #5   Title improved grip strength by 20# B   Time 8   Period Weeks               Plan - 12/02/14 0801    Clinical Impression Statement Patient tolerated today's treatment well today with only pain complaint at the beginning of LUE row with red theraband but patient noted that pain had diminished by the end of the exercise. Patient conitnues to do very well with all exercises  directed and with minimal  multimodal cueing for proper technique. Continues to present with B hand swelling but per patient report his hands are improving. Normal modalities response noted following removal of the modaliites.    Pt will benefit from skilled therapeutic intervention in order to improve on the following deficits Decreased range of motion;Impaired UE functional use;Decreased activity tolerance;Pain;Postural dysfunction;Decreased strength   Rehab Potential Good   Clinical Impairments Affecting Rehab Potential weak grip strength   PT Frequency 2x / week   PT Duration 8 weeks   PT Treatment/Interventions ADLs/Self Care Home Management;Cryotherapy;Electrical Stimulation;Moist Heat;Therapeutic exercise;Ultrasound;Neuromuscular re-education;Patient/family education;Manual techniques;Vasopneumatic Device;Passive range of motion   PT Next Visit Plan Continue strengthening and ROM exercises as well as grip exercises per MPT POC.   Consulted and Agree with Plan of Care Patient        Problem List There are no active problems to display for this patient.   [name], PTA 12/02/2014, 8:48 AM  [facility] [address] [place], [place], [address] Phone: [phone]   Fax:  [phone]  Name: Fernando Pratt MRN: [id number] Date of Birth: 1945-11-08

## 2014-12-05 ENCOUNTER — Encounter: Payer: PPO | Admitting: Physical Therapy

## 2014-12-15 ENCOUNTER — Ambulatory Visit (INDEPENDENT_AMBULATORY_CARE_PROVIDER_SITE_OTHER): Payer: PPO | Admitting: Family Medicine

## 2014-12-15 ENCOUNTER — Encounter: Payer: Self-pay | Admitting: Family Medicine

## 2014-12-15 VITALS — BP 134/78 | HR 88 | Temp 97.2°F | Ht 73.0 in | Wt 246.0 lb

## 2014-12-15 DIAGNOSIS — M25512 Pain in left shoulder: Secondary | ICD-10-CM | POA: Diagnosis not present

## 2014-12-15 DIAGNOSIS — M4712 Other spondylosis with myelopathy, cervical region: Secondary | ICD-10-CM | POA: Diagnosis not present

## 2014-12-15 MED ORDER — GABAPENTIN 300 MG PO CAPS: 600.0000 mg | ORAL_CAPSULE | Freq: Two times a day (BID) | ORAL | Status: DC

## 2014-12-15 MED ORDER — ATORVASTATIN CALCIUM 40 MG PO TABS: 40.0000 mg | ORAL_TABLET | Freq: Every day | ORAL | Status: DC

## 2014-12-15 MED ORDER — CELECOXIB 400 MG PO CAPS: 400.0000 mg | ORAL_CAPSULE | Freq: Every day | ORAL | Status: DC

## 2014-12-15 NOTE — Progress Notes (Signed)
Subjective:  Patient ID: Fernando Pratt, male    DOB: 1945-12-17  Age: 69 y.o. MRN: [id number]  CC: Shoulder Pain and Back Pain   HPI Fernando Pratt presents for shoulder pain is gone since having joint injection. Neck still a litle sore. 3/10. Missed PT last week. Too busy. Crawling under houses is posssible now. Before PT he couldn't do that. Planning surgery for carpal tunnel syndrome.This was dx by [facility]. (Report not available.)  History Fernando Pratt has no past medical history on file.   He has past surgical history that includes Neck surgery (1991).   His family history is not on file.He reports that he has never smoked. He does not have any smokeless tobacco history on file. He reports that he does not drink alcohol or use illicit drugs.  Outpatient Prescriptions Prior to Visit  Medication Sig Dispense Refill  . atorvastatin (LIPITOR) 40 MG tablet Take 1 tablet (40 mg total) by mouth daily. 90 tablet 1  . celecoxib (CELEBREX) 400 MG capsule Take 1 capsule (400 mg total) by mouth daily. With food 30 capsule 5  . gabapentin (NEURONTIN) 300 MG capsule Take 2 capsules (600 mg total) by mouth 2 (two) times daily. 120 capsule 1   No facility-administered medications prior to visit.    ROS Review of Systems  Constitutional: Negative for fever, chills and diaphoresis.  HENT: Negative for rhinorrhea and sore throat.   Respiratory: Negative for cough and shortness of breath.   Cardiovascular: Negative for chest pain.  Gastrointestinal: Negative for abdominal pain.  Musculoskeletal: Positive for myalgias, arthralgias and neck pain.  Skin: Negative for rash.  Neurological: Negative for weakness and headaches.    Objective:  BP 134/78 mmHg  Pulse 88  Temp(Src) 97.2 F (36.2 C) (Oral)  Ht 6\' 1"  (1.854 m)  Wt 246 lb (111.585 kg)  BMI 32.46 kg/m2  SpO2 97%  BP Readings from Last 3 Encounters:  12/15/14 134/78  11/26/14 143/87  11/03/14 137/78    Wt Readings from Last 3  Encounters:  12/15/14 246 lb (111.585 kg)  11/26/14 236 lb (107.049 kg)  11/03/14 236 lb 1.6 oz (107.094 kg)     Physical Exam  Constitutional: He appears well-developed and well-nourished.  HENT:  Head: Normocephalic and atraumatic.  Right Ear: Tympanic membrane and external ear normal. No decreased hearing is noted.  Left Ear: Tympanic membrane and external ear normal. No decreased hearing is noted.  Mouth/Throat: No oropharyngeal exudate or posterior oropharyngeal erythema.  Eyes: Pupils are equal, round, and reactive to light.  Neck: Normal range of motion. Neck supple.  Cardiovascular: Normal rate and regular rhythm.   No murmur heard. Pulmonary/Chest: Breath sounds normal. No respiratory distress.  Musculoskeletal: Normal range of motion. He exhibits no edema or tenderness.  Vitals reviewed.    Lab Results  Component Value Date   WBC 7.9 11/03/2014   HCT 39.7 11/03/2014   GLUCOSE 112* 11/03/2014   CHOL 192 11/03/2014   TRIG 64 11/03/2014   HDL 46 11/03/2014   LDLCALC 133* 11/03/2014   ALT 14 11/03/2014   AST 13 11/03/2014   NA 140 11/03/2014   K 4.6 11/03/2014   CL 101 11/03/2014   CREATININE 0.90 11/03/2014   BUN 17 11/03/2014   CO2 24 11/03/2014    Mr Cervical Spine Wo Contrast  09/26/2014  CLINICAL DATA:  69 year old male with increasing cervical neck pain radiating to the left arm with bilateral upper extremity weakness. Symptoms for 3 months with no  known injury. Remote spine surgery in the 1990s. EXAM: MRI CERVICAL SPINE WITHOUT CONTRAST TECHNIQUE: Multiplanar, multisequence MR imaging of the cervical spine was performed. No intravenous contrast was administered. COMPARISON:  None. FINDINGS: Previous fusion of the C5 through C7 cervical vertebrae. Focal lordosis at C4-C5. No marrow edema or evidence of acute osseous abnormality. Cervicomedullary junction is within normal limits. No cervical spinal cord signal abnormality identified. Dominant left vertebral  artery. No marrow edema or evidence of acute osseous abnormality. C2-C3: Moderate facet hypertrophy on the right. Mild broad-based central disc protrusion or disc osteophyte complex. Borderline to mild spinal stenosis. No significant foraminal stenosis. C3-C4: Right eccentric circumferential disc osteophyte complex with moderate facet hypertrophy greater on the right. Spinal stenosis with no cord mass effect. Severe bilateral C4 foraminal stenosis. C4-C5: Left eccentric circumferential disc osteophyte complex. Moderate facet hypertrophy greater on the right. Spinal stenosis with no cord mass effect. Severe left and moderate right C5 foraminal stenosis. C5-C6:  Previous fusion.  No significant stenosis. C6-C7: Previous fusion. Residual uncovertebral hypertrophy and mild to moderate C7 foraminal stenosis. C7-T1: Anterior eccentric disc osteophyte complex. Mild facet and uncovertebral hypertrophy. Mild right greater than left C8 foraminal stenosis. 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 IMPRESSION: 1. Previous C5-C6 and C6-C7 cervical fusion. 2. Multifactorial mild spinal stenosis with no cord mass effect from C2-C3 to C4-C5. 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 Electronically Signed   By: Odessa Fleming M.D.   On: 09/26/2014 20:18    Assessment & Plan:   Fernando Pratt was seen today for shoulder pain and back pain.  Diagnoses and all orders for this visit:  Spondylosis, cervical, with myelopathy  Pain in joint, shoulder region, left  I am having Mr. Fodor maintain his gabapentin, celecoxib, and atorvastatin.  No orders of the defined types were placed in this encounter.     Follow-up: Return in about 6 months (around 06/15/2015) for CPE, cholesterol, Pain.  Mechele Claude, M.D.

## 2014-12-22 ENCOUNTER — Ambulatory Visit: Payer: PPO | Attending: Family Medicine | Admitting: Physical Therapy

## 2014-12-22 ENCOUNTER — Encounter: Payer: Self-pay | Admitting: Physical Therapy

## 2014-12-22 DIAGNOSIS — M25512 Pain in left shoulder: Secondary | ICD-10-CM | POA: Insufficient documentation

## 2014-12-22 DIAGNOSIS — M25611 Stiffness of right shoulder, not elsewhere classified: Secondary | ICD-10-CM | POA: Insufficient documentation

## 2014-12-22 DIAGNOSIS — R29898 Other symptoms and signs involving the musculoskeletal system: Secondary | ICD-10-CM | POA: Diagnosis present

## 2014-12-22 DIAGNOSIS — R6889 Other general symptoms and signs: Secondary | ICD-10-CM | POA: Diagnosis present

## 2014-12-22 DIAGNOSIS — M25511 Pain in right shoulder: Secondary | ICD-10-CM | POA: Insufficient documentation

## 2014-12-22 DIAGNOSIS — M25612 Stiffness of left shoulder, not elsewhere classified: Secondary | ICD-10-CM | POA: Insufficient documentation

## 2014-12-22 NOTE — Therapy (Signed)
[facility] [address] [place], [place], [address] Phone: [phone]   Fax:  [phone]  Physical Therapy Treatment  Patient Details  Name: Fernando Pratt MRN: [id number] Date of Birth: August 23, 1945 Referring Provider: Mechele Claude, MD  Encounter Date: 12/22/2014      PT End of Session - 12/22/14 0737    Visit Number 7   Number of Visits 16   Date for PT Re-Evaluation 01/05/15   PT Start Time 0735   PT Stop Time 0814   PT Time Calculation (min) 39 min   Activity Tolerance Patient tolerated treatment well   Behavior During Therapy [facility] for tasks assessed/performed      No past medical history on file.  Past Surgical History  Procedure Laterality Date  . Neck surgery  1991    There were no vitals filed for this visit.  Visit Diagnosis:  Bilateral shoulder pain  Stiffness of shoulder joint, left  Stiffness of shoulder joint, right  Weakness of both arms  Activity intolerance      Subjective Assessment - 12/22/14 0736    Subjective Reports that he has been busy lately. States that he can reach overhead and can close his hands now.   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            [facility] PT Assessment - 12/22/14 0001    Assessment   Medical Diagnosis L shoulder pain; B knee pain   Onset Date/Surgical Date 07/10/14   Prior Therapy none   ROM / Strength   AROM / PROM / Strength AROM;Strength   AROM   AROM Assessment Site Shoulder   Right/Left Shoulder Right;Left   Right Shoulder Flexion 170 Degrees   Left Shoulder Flexion 165 Degrees   Strength   Overall Strength Deficits   Overall Strength Comments R grip 12#, L grip 25#                     [facility] Adult PT Treatment/Exercise - 12/22/14 0001    Shoulder Exercises: Sidelying   External Rotation Strengthening;Both;20  reps;Weights   External Rotation Weight (lbs) 2   Shoulder Exercises: Standing   Protraction Strengthening;Both;20 reps;Theraband   Theraband Level (Shoulder Protraction) Level 2 (Red)   External Rotation Strengthening;Both;Theraband;20 reps   Theraband Level (Shoulder External Rotation) Level 2 (Red)   Internal Rotation Strengthening;Both;Theraband;20 reps   Theraband Level (Shoulder Internal Rotation) Level 2 (Red)   Extension Strengthening;Both;20 reps;Theraband   Theraband Level (Shoulder Extension) Level 2 (Red)   Extension Limitations AAROM extension in standing x30 reps   Row Strengthening;Both;20 reps;Theraband   Theraband Level (Shoulder Row) Level 2 (Red)   Other Standing Exercises B wall slides x20 reps each   Shoulder Exercises: Pulleys   Flexion Other (comment)  x5 min   Shoulder Exercises: ROM/Strengthening   UBE (Upper Arm Bike) 90 RPM x6 min   Hand Exercises   Digiticizer --   Other Hand Exercises B red web grip x30 reps   Other Hand Exercises B wrist ext stretch 3x30 sec                PT Education - 12/22/14 0809    Education provided Yes   Education Details HEP- wrist extension stretch   Person(s) Educated Patient   Methods Explanation;Demonstration;Handout   Comprehension Verbalized understanding;Returned demonstration  PT Short Term Goals - 11/21/14 0808    PT SHORT TERM GOAL #1   Title I with initial HEP 11/24/14   Time 2   Period Weeks   Status Achieved           PT Long Term Goals - 11/10/14 1545    PT LONG TERM GOAL #1   Title I with advanced HEP   Time 8   Period Weeks   Status New   PT LONG TERM GOAL #2   Title Improved L shoulder [facility] to [facility] to [facility]form [facility]   Time 8   Period Weeks   Status New   PT LONG TERM GOAL #3   Title Improved shoulder strength to 5-/5 B   Time 8   Period Weeks   Status New   PT LONG TERM GOAL #4   Title decreased pain with ADLS to 3/10 or less in B shoulders   PT LONG TERM GOAL #5    Title improved grip strength by 20# B   Time 8   Period Weeks               Plan - 12/22/14 0810    Clinical Impression Statement Patient tolerated today's treatment well with no complaints of pain with any exercises. B shoulder flexion ROM has improved well although B grip strength is still decreased. Accepted new HEP for wrist extension stretch for B carpal tunnel without question. Denied B shoulder pain following today's treatment.   Pt will benefit from skilled therapeutic intervention in order to improve on the following deficits Decreased range of motion;Impaired UE functional use;Decreased activity tolerance;Pain;Postural dysfunction;Decreased strength   Rehab Potential Good   Clinical Impairments Affecting Rehab Potential weak grip strength   PT Frequency 2x / week   PT Duration 8 weeks   PT Treatment/Interventions ADLs/Self Care Home Management;Cryotherapy;Electrical Stimulation;Moist Heat;Therapeutic exercise;Ultrasound;Neuromuscular re-education;Patient/family education;Manual techniques;Vasopneumatic Device;Passive range of motion   PT Next Visit Plan Continue strengthening and ROM exercises as well as grip exercises per MPT POC.   PT Home Exercise Plan wrist ext stretch   Consulted and Agree with Plan of Care Patient        Problem List There are no active problems to display for this patient.   Evelene CroonKelsey M Parsons, PTA 12/22/2014, 8:50 AM  [facility] [address] [place], [place], [address] Phone: [phone]   Fax:  [phone]  Name: Fernando Pratt MRN: [id number] Date of Birth: 10-Oct-1945

## 2014-12-22 NOTE — Patient Instructions (Signed)
Wrist Flexor Stretch    Keeping elbow straight, grasp left hand and slowly bend wrist back until stretch is felt with both wrists. Hold __30__ seconds. Relax. Repeat __3__ times per set. Do __1__ sets per session. Do __3__ sessions per day.  Copyright  VHI. All rights reserved.

## 2014-12-25 ENCOUNTER — Ambulatory Visit: Payer: PPO | Admitting: *Deleted

## 2014-12-25 DIAGNOSIS — M25512 Pain in left shoulder: Principal | ICD-10-CM

## 2014-12-25 DIAGNOSIS — M25612 Stiffness of left shoulder, not elsewhere classified: Secondary | ICD-10-CM

## 2014-12-25 DIAGNOSIS — M25511 Pain in right shoulder: Secondary | ICD-10-CM

## 2014-12-25 DIAGNOSIS — R6889 Other general symptoms and signs: Secondary | ICD-10-CM

## 2014-12-25 DIAGNOSIS — M25611 Stiffness of right shoulder, not elsewhere classified: Secondary | ICD-10-CM

## 2014-12-25 DIAGNOSIS — R29898 Other symptoms and signs involving the musculoskeletal system: Secondary | ICD-10-CM

## 2014-12-25 NOTE — Therapy (Signed)
[facility] [address] [place], [place], [address] Phone: [phone]   Fax:  [phone]  Physical Therapy Treatment  Patient Details  Name: TIANDRE TEALL MRN: [id number] Date of Birth: [birth year] Referring Provider: Mechele Claude, MD  Encounter Date: 12/25/2014      PT End of Session - 12/25/14 1605    Visit Number 8   Number of Visits 16   Date for PT Re-Evaluation 01/05/15   PT Start Time 1600   PT Stop Time 1649   PT Time Calculation (min) 49 min      No past medical history on file.  Past Surgical History  Procedure Laterality Date  . Neck surgery  1991    There were no vitals filed for this visit.  Visit Diagnosis:  Bilateral shoulder pain  Stiffness of shoulder joint, left  Stiffness of shoulder joint, right  Weakness of both arms  Activity intolerance      Subjective Assessment - 12/25/14 1602    Subjective Reports that he has been busy lately. States that he can reach overhead and can close his hands now.   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 Yes   Pain Score 2    Pain Orientation Right   Pain Descriptors / Indicators Aching   Pain Type Acute pain   Pain Onset More than a month ago   Aggravating Factors  reaching out   Pain Relieving Factors meds                         [facility] Adult PT Treatment/Exercise - 12/25/14 0001    Exercises   Exercises Shoulder;Hand   Shoulder Exercises: Standing   External Rotation Strengthening;Both;Theraband;20 reps   Theraband Level (Shoulder External Rotation) Level 2 (Red)   Internal Rotation Strengthening;Both;Theraband;20 reps   Theraband Level (Shoulder Internal Rotation) Level 2 (Red)   Extension Strengthening;Both;20 reps;Theraband   Theraband Level (Shoulder Extension) Level 2 (Red)   Extension Limitations AAROM extension in standing x30 reps   Row Strengthening;Both;20 reps;Theraband   Theraband Level (Shoulder Row) Level 2 (Red)   Other Standing Exercises B wall slides x20 reps each   Shoulder Exercises: Pulleys   Flexion Other (comment)  x5 min   Shoulder Exercises: ROM/Strengthening   UBE (Upper Arm Bike) 90 RPM x6 min   Hand Exercises   Other Hand Exercises B red web grip x30 reps   Other Hand Exercises B wrist ext stretch 3x30 sec                  PT Short Term Goals - 11/21/14 0808    PT SHORT TERM GOAL #1   Title I with initial HEP 11/24/14   Time 2   Period Weeks   Status Achieved           PT Long Term Goals - 11/10/14 1545    PT LONG TERM GOAL #1   Title I with advanced HEP   Time 8   Period Weeks   Status New   PT LONG TERM GOAL #2   Title Improved L shoulder ROM to [facility] to peform ADLs   Time 8   Period Weeks   Status New   PT LONG TERM GOAL #3   Title Improved shoulder strength to 5-/5 B  Time 8   Period Weeks   Status New   PT LONG TERM GOAL #4   Title decreased pain with ADLS to 3/10 or less in B shoulders   PT LONG TERM GOAL #5   Title improved grip strength by 20# B   Time 8   Period Weeks               Plan - 12/25/14 1605    Pt will benefit from skilled therapeutic intervention in order to improve on the following deficits Decreased range of motion;Impaired UE functional use;Decreased activity tolerance;Pain;Postural dysfunction;Decreased strength   Rehab Potential Good   Clinical Impairments Affecting Rehab Potential weak grip strength   PT Frequency 2x / week   PT Duration 8 weeks   PT Treatment/Interventions ADLs/Self Care Home Management;Cryotherapy;Electrical Stimulation;Moist Heat;Therapeutic exercise;Ultrasound;Neuromuscular re-education;Patient/family education;Manual techniques;Vasopneumatic Device;Passive range of motion   PT Next Visit Plan Continue strengthening and ROM exercises as well as  grip exercises per MPT POC.   PT Home Exercise Plan wrist ext stretch        Problem List There are no active problems to display for this patient.   Corayma Cashatt,[name], PTA 12/25/2014, 6:11 PM  [facility] [address] [place], [place], [address] Phone: [phone]   Fax:  [phone]  Name: Zannie KehrRichard B Schuknecht MRN: [id number] Date of Birth: November 10, 1945

## 2014-12-30 ENCOUNTER — Encounter: Payer: Self-pay | Admitting: Physical Therapy

## 2014-12-30 ENCOUNTER — Ambulatory Visit: Payer: PPO | Admitting: Physical Therapy

## 2014-12-30 DIAGNOSIS — M25611 Stiffness of right shoulder, not elsewhere classified: Secondary | ICD-10-CM

## 2014-12-30 DIAGNOSIS — M25612 Stiffness of left shoulder, not elsewhere classified: Secondary | ICD-10-CM

## 2014-12-30 DIAGNOSIS — M25511 Pain in right shoulder: Secondary | ICD-10-CM | POA: Diagnosis not present

## 2014-12-30 DIAGNOSIS — M25512 Pain in left shoulder: Principal | ICD-10-CM

## 2014-12-30 DIAGNOSIS — R29898 Other symptoms and signs involving the musculoskeletal system: Secondary | ICD-10-CM

## 2014-12-30 DIAGNOSIS — R6889 Other general symptoms and signs: Secondary | ICD-10-CM

## 2014-12-30 NOTE — Therapy (Signed)
[facility] [address] [place], [place], [address] Phone: [phone]   Fax:  [phone]  Physical Therapy Treatment  Patient Details  Name: Fernando Pratt MRN: [id number] Date of Birth: 04-16-1945 Referring Provider: Mechele Claude, MD  Encounter Date: 12/30/2014      PT End of Session - 12/30/14 1607    Visit Number 9   Number of Visits 16   Date for PT Re-Evaluation 01/05/15   PT Start Time 1602   PT Stop Time 1642   PT Time Calculation (min) 40 min   Activity Tolerance Patient tolerated treatment well   Behavior During Therapy [facility] for tasks assessed/performed      No past medical history on file.  Past Surgical History  Procedure Laterality Date  . Neck surgery  1991    There were no vitals filed for this visit.  Visit Diagnosis:  Bilateral shoulder pain  Stiffness of shoulder joint, left  Stiffness of shoulder joint, right  Weakness of both arms  Activity intolerance      Subjective Assessment - 12/30/14 1604    Subjective Reports that shoulder are good today and that wrist stretches given in previous treatment help. States that he wears wrist straps when working.   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 No/denies            [facility] PT Assessment - 12/30/14 0001    Assessment   Medical Diagnosis L shoulder pain; B knee pain   Onset Date/Surgical Date 07/10/14                     [facility] Adult PT Treatment/Exercise - 12/30/14 0001    Exercises   Exercises Shoulder;Wrist   Shoulder Exercises: Seated   Flexion Strengthening;Both;Weights  3x10 reps   Flexion Weight (lbs) 1   Shoulder Exercises: Standing   Protraction Strengthening;Both;Theraband  3x10 reps   Theraband Level (Shoulder Protraction) Level 2 (Red)   External Rotation  Strengthening;Both;Theraband;Other (comment)  3x10 reps   Theraband Level (Shoulder External Rotation) Level 2 (Red)   Internal Rotation Strengthening;Both;Theraband;Other (comment)  3x10 reps   Theraband Level (Shoulder Internal Rotation) Level 2 (Red)   Extension Strengthening;Both;Theraband;Other (comment)  3x10 reps   Theraband Level (Shoulder Extension) Level 2 (Red)   Row Strengthening;Both;Theraband;Other (comment)  3x10 reps   Theraband Level (Shoulder Row) Level 2 (Red)   Other Standing Exercises B wall slides x20 reps each   Shoulder Exercises: Pulleys   Flexion 3 minutes   Shoulder Exercises: ROM/Strengthening   UBE (Upper Arm Bike) 90 RPM x8 min   Hand Exercises   Other Hand Exercises B red web grip x30 reps   Wrist Exercises   Wrist Flexion Strengthening;Both;Other reps (comment);Seated;Bar weights/barbell  3x10 reps   Bar Weights/Barbell (Wrist Flexion) 1 lb   Wrist Extension Strengthening;Both;Other reps (comment);Seated;Bar weights/barbell  3x10 reps   Bar Weights/Barbell (Wrist Extension) 1 lb   Wrist Radial Deviation Strengthening;Both;Other reps (comment);Seated;Bar weights/barbell  x30 reps   Bar Weights/Barbell (Radial Deviation) 1 lb                  PT Short Term Goals - 11/21/14 0808    PT SHORT TERM GOAL #1   Title I with initial HEP 11/24/14   Time 2   Period Weeks   Status Achieved  PT Long Term Goals - 12/30/14 1613    PT LONG TERM GOAL #1   Title I with advanced HEP   Time 8   Period Weeks   Status Achieved   PT LONG TERM GOAL #2   Title [facility]   Time 8   Period Weeks   Status On-going   PT LONG TERM GOAL #3   Title Improved shoulder strength to 5-/5 B   Time 8   Period Weeks   Status On-going   PT LONG TERM GOAL #4   Title decreased pain with ADLS to 3/10 or less in B shoulders   Status Achieved   PT LONG TERM GOAL #5   Title improved grip strength by 20# B   Time 8    Period Weeks   Status On-going               Plan - 12/30/14 1647    Clinical Impression Statement Patient tolerated today's treatment well with only reports of pain during first set of B shoulder flexion with 1# that dissipated. Completed all strengthening exercises with good technique and no other reports of pain. Tolerated wrist and grip exercises well with only complaints of R wrist limited in extension and shakiness in L wrist following strengthening exercises. Denied shoulder and wrist pain following today's treatment.   Pt will benefit from skilled therapeutic intervention in order to improve on the following deficits Decreased range of motion;Impaired UE functional use;Decreased activity tolerance;Pain;Postural dysfunction;Decreased strength   Rehab Potential Good   Clinical Impairments Affecting Rehab Potential weak grip strength   PT Frequency 2x / week   PT Duration 8 weeks   PT Treatment/Interventions ADLs/Self Care Home Management;Cryotherapy;Electrical Stimulation;Moist Heat;Therapeutic exercise;Ultrasound;Neuromuscular re-education;Patient/family education;Manual techniques;Vasopneumatic Device;Passive range of motion   PT Next Visit Plan Continue strengthening and ROM exercises as well as grip exercises per MPT POC.   PT Home Exercise Plan wrist ext stretch   Consulted and Agree with Plan of Care Patient        Problem List There are no active problems to display for this patient.   Evelene CroonKelsey M Parsons, PTA 12/30/2014, 4:56 PM  [facility] [address] [place], [place], [address] Phone: [phone]   Fax:  [phone]  Name: Fernando Pratt MRN: [id number] Date of Birth: February 03, 1945

## 2015-01-06 DIAGNOSIS — M7542 Impingement syndrome of left shoulder: Secondary | ICD-10-CM | POA: Diagnosis not present

## 2015-01-06 DIAGNOSIS — M7541 Impingement syndrome of right shoulder: Secondary | ICD-10-CM | POA: Diagnosis not present

## 2015-01-08 ENCOUNTER — Ambulatory Visit: Payer: PPO | Attending: Family Medicine | Admitting: *Deleted

## 2015-01-08 ENCOUNTER — Encounter: Payer: Self-pay | Admitting: *Deleted

## 2015-01-08 DIAGNOSIS — R6889 Other general symptoms and signs: Secondary | ICD-10-CM | POA: Insufficient documentation

## 2015-01-08 DIAGNOSIS — M25612 Stiffness of left shoulder, not elsewhere classified: Secondary | ICD-10-CM | POA: Diagnosis not present

## 2015-01-08 DIAGNOSIS — M25611 Stiffness of right shoulder, not elsewhere classified: Secondary | ICD-10-CM | POA: Insufficient documentation

## 2015-01-08 DIAGNOSIS — M25512 Pain in left shoulder: Secondary | ICD-10-CM | POA: Diagnosis not present

## 2015-01-08 DIAGNOSIS — R29898 Other symptoms and signs involving the musculoskeletal system: Secondary | ICD-10-CM

## 2015-01-08 DIAGNOSIS — M25511 Pain in right shoulder: Secondary | ICD-10-CM | POA: Insufficient documentation

## 2015-01-08 NOTE — Therapy (Signed)
[facility] [address] [place], [place], [address] Phone: [phone]   Fax:  [phone]  Physical Therapy Treatment  Patient Details  Name: Fernando KehrRichard B Rhatigan MRN: [id number] Date of Birth: 07-24-1945 Referring Provider: Mechele ClaudeWarren Stacks, MD  Encounter Date: 01/08/2015      PT End of Session - 01/08/15 1725    Visit Number 10   Number of Visits 16   Date for PT Re-Evaluation 02/05/15   PT Start Time 1651   PT Stop Time 1745   PT Time Calculation (min) 54 min      History reviewed. No pertinent past medical history.  Past Surgical History  Procedure Laterality Date  . Neck surgery  1991    There were no vitals filed for this visit.  Visit Diagnosis:  Bilateral shoulder pain  Stiffness of shoulder joint, left  Stiffness of shoulder joint, right  Weakness of both arms  Activity intolerance      Subjective Assessment - 01/08/15 1652    Subjective Reports that shoulder are good today and that wrist stretches given in previous treatment help. States that he wears wrist straps when working.   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 No/denies   Pain Location Shoulder   Pain Orientation Right   Pain Descriptors / Indicators Aching   Pain Onset More than a month ago            [facility] - 01/08/15 0001    Strength   Overall Strength Deficits   Overall Strength Comments R grip 32#, L grip 45#                     [facility] Adult PT Treatment/Exercise - 01/08/15 0001    Exercises   Exercises Shoulder;Wrist   Shoulder Exercises: Seated   Flexion --   Flexion Weight (lbs) --   Shoulder Exercises: Standing   Protraction Strengthening;Both;Theraband;20 reps   Theraband Level (Shoulder Protraction) Level 2 (Red)   External Rotation Strengthening;Both;Theraband;Other (comment);20 reps   Theraband Level (Shoulder  External Rotation) Level 2 (Red)   Internal Rotation Strengthening;Both;Theraband;Other (comment);20 reps   Theraband Level (Shoulder Internal Rotation) Level 2 (Red)   Flexion Weights;Strengthening;Right;Left;10 reps;20 reps  2# 3x10   Extension Strengthening;Both;Theraband;Other (comment);20 reps   Theraband Level (Shoulder Extension) Level 2 (Red)   Row Strengthening;Both;Theraband;Other (comment);20 reps   Theraband Level (Shoulder Row) Level 2 (Red)   Shoulder Exercises: Pulleys   Flexion 3 minutes   Shoulder Exercises: ROM/Strengthening   UBE (Upper Arm Bike) 90 RPM x6 min   Hand Exercises   Other Hand Exercises B red web grip x30 reps   Wrist Exercises   Wrist Flexion Strengthening;Both;Other reps (comment);Seated;Bar weights/barbell  3x10 reps   Bar Weights/Barbell (Wrist Flexion) 2 lbs   Wrist Extension Strengthening;Both;Other reps (comment);Seated;Bar weights/barbell  3x10 reps   Bar Weights/Barbell (Wrist Extension) 2 lbs   Wrist Radial Deviation --  x30 reps   Bar Weights/Barbell (Radial Deviation) --                  PT Short Term Goals - 01/08/15 1727    PT SHORT TERM GOAL #1   Title I with initial HEP 11/24/14   Time 2   Period Weeks   Status Achieved           PT Long Term Goals - 01/08/15 1727  PT LONG TERM GOAL #1   Title I with advanced HEP   Time 8   Period Weeks   Status Achieved   PT LONG TERM GOAL #2   Title Improved L shoulder ROM to [facility] to peform ADLs   Time 8   Period Weeks   Status On-going   PT LONG TERM GOAL #3   Title Improved shoulder strength to 5-/5 B   Time 8   Period Weeks   Status On-going   PT LONG TERM GOAL #4   Title decreased pain with ADLS to 3/10 or less in B shoulders   Status Achieved   PT LONG TERM GOAL #5   Title improved grip strength by 20# B   Time 8   Period Weeks   Status Achieved               Plan - 01/08/15 1734    Clinical Impression Statement Pt continues to progress and  meet goals. He was able to meet LTG for Grip strength today improving LT from 12#s to 33#s and RT 25#s to 45#s. LT shldr ROM is WFLs with all motions except HHB and this goal is ongoing as well as strength goal. He is 4+/5 in ER  strength BIL and LTG is ongoing   Pt will benefit from skilled therapeutic intervention in order to improve on the following deficits Decreased range of motion;Impaired UE functional use;Decreased activity tolerance;Pain;Postural dysfunction;Decreased strength   Rehab Potential Good   Clinical Impairments Affecting Rehab Potential weak grip strength   PT Frequency 2x / week   PT Duration 4 weeks (6 visits)   PT Treatment/Interventions ADLs/Self Care Home Management;Cryotherapy;Electrical Stimulation;Moist Heat;Therapeutic exercise;Ultrasound;Neuromuscular re-education;Patient/family education;Manual techniques;Vasopneumatic Device;Passive range of motion   PT Next Visit Plan Continue strengthening and ROM exercises as well as grip exercises per MPT POC.  MC Recert to cont. with PT to reach goals   PT Home Exercise Plan wrist ext stretch   Consulted and Agree with Plan of Care Patient      Gcode completed 10th visit: PT primary other: current status 33% limited (CJ); goal CJ  Problem List There are no active problems to display for this patient.   Garek Schuneman,[name] , PTA  01/08/2015, 5:56 PM  [facility] [address] [place], [place], [address] Phone: [phone]   Fax:  [phone]  Name: Fernando Pratt MRN: [id number] Date of Birth: June 09, 1945   Solon Palm, PT 01/09/2015 12:18 PM

## 2015-01-13 ENCOUNTER — Ambulatory Visit: Payer: PPO | Admitting: Physical Therapy

## 2015-01-13 DIAGNOSIS — M25611 Stiffness of right shoulder, not elsewhere classified: Secondary | ICD-10-CM

## 2015-01-13 DIAGNOSIS — M25512 Pain in left shoulder: Principal | ICD-10-CM

## 2015-01-13 DIAGNOSIS — R6889 Other general symptoms and signs: Secondary | ICD-10-CM

## 2015-01-13 DIAGNOSIS — M25511 Pain in right shoulder: Secondary | ICD-10-CM | POA: Diagnosis not present

## 2015-01-13 DIAGNOSIS — M25612 Stiffness of left shoulder, not elsewhere classified: Secondary | ICD-10-CM

## 2015-01-13 DIAGNOSIS — R29898 Other symptoms and signs involving the musculoskeletal system: Secondary | ICD-10-CM

## 2015-01-13 NOTE — Therapy (Signed)
[facility] [address] [place], [place], [address] Phone: [phone]   Fax:  [phone]  Physical Therapy Treatment  Patient Details  Name: Fernando Pratt MRN: [id number] Date of Birth: [birth year] Referring Provider: Mechele ClaudeWarren Stacks, MD  Encounter Date: 01/13/2015      PT End of Session - 01/13/15 1732    Visit Number 11   Number of Visits 16   Date for PT Re-Evaluation 02/05/15   PT Start Time 0440   PT Stop Time 0530   PT Time Calculation (min) 50 min   Activity Tolerance Patient tolerated treatment well   Behavior [facility] Therapy [facility] for tasks assessed/performed      No past medical history on file.  Past Surgical History  Procedure Laterality Date  . Neck surgery  1991    There were no vitals filed for this visit.  Visit Diagnosis:  Bilateral shoulder pain  Stiffness of shoulder joint, left  Stiffness of shoulder joint, right  Weakness of both arms  Activity intolerance      Subjective Assessment - 01/13/15 1657    Subjective Been doing good with no shoulder pain today.   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 No/denies                         [facility] Treatment/Exercise - 01/13/15 0001    Exercises   Exercises Shoulder;Wrist   Shoulder Exercises: Seated   Other Seated Exercises UBE x 10 minutes at 90 RPM's.   Other Seated Exercises Pulleys x 5 minutes f/b UE ranger x 5 minutes.   Shoulder Exercises: Standing   Protraction Limitations Orange XTS to faigue x 3   Extension Limitations Orange XTS to faigue x 3.   Other Standing Exercises Bilateral RW 4 with red theraband to fatigue f/b 3# full can to fatigue.(multiple sets).   Other Standing Exercises Orange XTS bilateral forward flexion with punches to fatigue x 3.   Wrist Exercises   Wrist Flexion Limitations 3# to fatigue (multiple sets).   Wrist Extension Limitations 3# to faigue (multiple sets).                  PT Short Term Goals - 01/08/15 1727    PT SHORT TERM GOAL #1   Title I with initial HEP 11/24/14   Time 2   Period Weeks   Status Achieved           PT Long Term Goals - 01/08/15 1727    PT LONG TERM GOAL #1   Title I with advanced HEP   Time 8   Period Weeks   Status Achieved   PT LONG TERM GOAL #2   Title Improved [facility] to [facility]   Time 8   Period Weeks   Status On-going   PT LONG TERM GOAL #3   Title Improved shoulder strength to 5-/5 B   Time 8   Period Weeks   Status On-going   PT LONG TERM GOAL #4   Title decreased pain with ADLS to 3/10 or less in B shoulders   Status Achieved   PT LONG TERM GOAL #5   Title improved grip strength by 20# B   Time 8   Period Weeks   Status Achieved  Problem List There are no active problems to display for this patient.   Trev Boley, [place] MPT 01/13/2015, 5:36 PM  [facility] [address] [place], [place], [address] Phone: [phone]   Fax:  [phone]  Name: Fernando Pratt MRN: [id number] Date of Birth: 05/29/1945

## 2015-01-27 DIAGNOSIS — M9901 Segmental and somatic dysfunction of cervical region: Secondary | ICD-10-CM | POA: Diagnosis not present

## 2015-01-27 DIAGNOSIS — M9905 Segmental and somatic dysfunction of pelvic region: Secondary | ICD-10-CM | POA: Diagnosis not present

## 2015-01-27 DIAGNOSIS — M62838 Other muscle spasm: Secondary | ICD-10-CM | POA: Diagnosis not present

## 2015-01-27 DIAGNOSIS — M9902 Segmental and somatic dysfunction of thoracic region: Secondary | ICD-10-CM | POA: Diagnosis not present

## 2015-01-27 DIAGNOSIS — M9904 Segmental and somatic dysfunction of sacral region: Secondary | ICD-10-CM | POA: Diagnosis not present

## 2015-01-27 DIAGNOSIS — M9903 Segmental and somatic dysfunction of lumbar region: Secondary | ICD-10-CM | POA: Diagnosis not present

## 2015-02-03 DIAGNOSIS — G5603 Carpal tunnel syndrome, bilateral upper limbs: Secondary | ICD-10-CM | POA: Diagnosis not present

## 2015-03-03 DIAGNOSIS — G5601 Carpal tunnel syndrome, right upper limb: Secondary | ICD-10-CM | POA: Diagnosis not present

## 2015-03-03 DIAGNOSIS — G5602 Carpal tunnel syndrome, left upper limb: Secondary | ICD-10-CM | POA: Diagnosis not present

## 2015-04-22 ENCOUNTER — Telehealth: Payer: Self-pay | Admitting: Family Medicine

## 2015-04-22 MED ORDER — CELECOXIB 400 MG PO CAPS: 400.0000 mg | ORAL_CAPSULE | Freq: Every day | ORAL | Status: DC

## 2015-04-22 NOTE — Telephone Encounter (Signed)
Needs refill of celebrex sent to [facility] in [place].  If approved please send

## 2015-04-22 NOTE — Telephone Encounter (Signed)
The requested med has been sent to the pharmacy.  Please let the patient know. Thanks, WS 

## 2015-04-22 NOTE — Addendum Note (Signed)
Addended by: Mechele ClaudeSTACKS, Halsey Persaud on: 04/22/2015 01:12 PM   Modules accepted: Orders

## 2015-06-06 DIAGNOSIS — H40033 Anatomical narrow angle, bilateral: Secondary | ICD-10-CM | POA: Diagnosis not present

## 2015-06-06 DIAGNOSIS — H2513 Age-related nuclear cataract, bilateral: Secondary | ICD-10-CM | POA: Diagnosis not present

## 2015-09-22 ENCOUNTER — Ambulatory Visit (INDEPENDENT_AMBULATORY_CARE_PROVIDER_SITE_OTHER): Payer: PPO

## 2015-09-22 ENCOUNTER — Ambulatory Visit (INDEPENDENT_AMBULATORY_CARE_PROVIDER_SITE_OTHER): Payer: PPO | Admitting: Family Medicine

## 2015-09-22 ENCOUNTER — Encounter: Payer: Self-pay | Admitting: Family Medicine

## 2015-09-22 VITALS — BP 123/70 | HR 94 | Temp 97.4°F | Ht 73.0 in | Wt 254.4 lb

## 2015-09-22 DIAGNOSIS — G629 Polyneuropathy, unspecified: Secondary | ICD-10-CM | POA: Diagnosis not present

## 2015-09-22 DIAGNOSIS — M4712 Other spondylosis with myelopathy, cervical region: Secondary | ICD-10-CM

## 2015-09-22 DIAGNOSIS — E785 Hyperlipidemia, unspecified: Secondary | ICD-10-CM

## 2015-09-22 DIAGNOSIS — Z23 Encounter for immunization: Secondary | ICD-10-CM

## 2015-09-22 DIAGNOSIS — E559 Vitamin D deficiency, unspecified: Secondary | ICD-10-CM

## 2015-09-22 DIAGNOSIS — M25512 Pain in left shoulder: Secondary | ICD-10-CM | POA: Diagnosis not present

## 2015-09-22 DIAGNOSIS — M7731 Calcaneal spur, right foot: Secondary | ICD-10-CM

## 2015-09-22 DIAGNOSIS — N5203 Combined arterial insufficiency and corporo-venous occlusive erectile dysfunction: Secondary | ICD-10-CM

## 2015-09-22 DIAGNOSIS — M7661 Achilles tendinitis, right leg: Secondary | ICD-10-CM

## 2015-09-22 DIAGNOSIS — M79671 Pain in right foot: Secondary | ICD-10-CM

## 2015-09-22 MED ORDER — SILDENAFIL CITRATE 20 MG PO TABS: 20.0000 mg | ORAL_TABLET | Freq: Every day | ORAL | 5 refills | Status: DC | PRN

## 2015-09-22 NOTE — Progress Notes (Signed)
Subjective:  Patient ID: Fernando Pratt, male    DOB: Jul 09, 1945  Age: 70 y.o. MRN: [id number]  CC: Medication Refill (pt here today for routine follow up cholesterol and also c/o right heel pain)   HPI Fernando Pratt presents forPatient in for follow-up of elevated cholesterol. Doing well without complaints on current medication. Denies side effects of statin including myalgia and arthralgia and nausea. Also in today for liver function testing. Currently no chest pain, shortness of breath or other cardiovascular related symptoms noted.  Right heel pain is noted at the plantar surface and at the Achilles insertion. Interferes with his mobility. He has to hobble. This has been present in the past and improved with injections. He would like referral to orthopedics.  Patient is sexually active. He is having more erectile issues recently. His erections are not firm or don't come at all when he is stimulated.   History Uthman has no past medical history on file.   He has a past surgical history that includes Neck surgery (1991).   His family history is not on file.He reports that he has never smoked. He has never used smokeless tobacco. He reports that he does not drink alcohol or use drugs.    ROS Review of Systems  Constitutional: Negative for chills, diaphoresis and fever.  HENT: Negative for rhinorrhea and sore throat.   Respiratory: Negative for cough and shortness of breath.   Cardiovascular: Negative for chest pain.  Gastrointestinal: Negative for abdominal pain.  Musculoskeletal: Positive for arthralgias. Negative for myalgias.  Skin: Negative for rash.  Neurological: Negative for weakness and headaches.    Objective:  BP 123/70   Pulse 94   Temp 97.4 F (36.3 C) (Oral)   Ht _0  (1.854 m)   Wt 254 lb 6 oz (115.4 kg)   BMI 33.56 kg/m   BP Readings from Last 3 Encounters:  09/22/15 123/70  12/15/14 134/78  11/26/14 (!) 143/87    Wt Readings from Last 3  Encounters:  09/22/15 254 lb 6 oz (115.4 kg)  12/15/14 246 lb (111.6 kg)  11/26/14 236 lb (107 kg)     Physical Exam  Constitutional: He is oriented to person, place, and time. He appears well-developed and well-nourished.  HENT:  Head: Normocephalic and atraumatic.  Right Ear: Tympanic membrane and external ear normal. No decreased hearing is noted.  Left Ear: Tympanic membrane and external ear normal. No decreased hearing is noted.  Mouth/Throat: No oropharyngeal exudate or posterior oropharyngeal erythema.  Eyes: Pupils are equal, round, and reactive to light.  Neck: Normal range of motion. Neck supple.  Cardiovascular: Normal rate and regular rhythm.   No murmur heard. Pulmonary/Chest: Breath sounds normal. No respiratory distress.  Abdominal: Soft. Bowel sounds are normal. He exhibits no mass. There is no tenderness.  Musculoskeletal: He exhibits tenderness ( base of the Achilles on the right with some hypertrophic change).  Neurological: He is alert and oriented to person, place, and time.  Skin: Skin is warm and dry.  Psychiatric: He has a normal mood and affect.  Vitals reviewed.    Lab Results  Component Value Date   WBC 7.5 09/22/2015   HCT 40.8 09/22/2015   PLT 137 (L) 09/22/2015   GLUCOSE 89 09/22/2015   CHOL 185 09/22/2015   TRIG 195 (H) 09/22/2015   HDL 35 (L) 09/22/2015   LDLCALC 111 (H) 09/22/2015   ALT 24 09/22/2015   AST 23 09/22/2015   NA 142 09/22/2015  K 4.4 09/22/2015   CL 103 09/22/2015   CREATININE 1.08 09/22/2015   BUN 22 09/22/2015   CO2 28 09/22/2015    Assessment & Plan:   Laymond was seen today for medication refill.  Diagnoses and all orders for this visit:  Spondylosis, cervical, with myelopathy -     CBC with Differential/Platelet -     CMP14+EGFR  Pain in joint, shoulder region, left -     CBC with Differential/Platelet -     CMP14+EGFR  Hyperlipidemia -     CBC with Differential/Platelet -     CMP14+EGFR -     Lipid  panel  Neuropathy (HCC) -     CBC with Differential/Platelet -     CMP14+EGFR  Tendonitis, Achilles, right -     DG Foot Complete Right; Future -     CBC with Differential/Platelet -     CMP14+EGFR  Heel spur, right -     Ambulatory referral to Orthopedics -     CBC with Differential/Platelet -     CMP14+EGFR  Combined arterial insufficiency and corporo-venous occlusive erectile dysfunction -     CBC with Differential/Platelet -     CMP14+EGFR  Vitamin D deficiency -     CBC with Differential/Platelet -     CMP14+EGFR -     VITAMIN D 25 Hydroxy (Vit-D Deficiency, Fractures)  Encounter for immunization -     Flu Vaccine QUAD 36+ mos IM  Other orders -     Discontinue: sildenafil (REVATIO) 20 MG tablet; Take 1 tablet (20 mg total) by mouth daily as needed (2-5 as needed).     I have discontinued Mr. Fiorito's gabapentin and Fish Oil. I am also having him maintain his atorvastatin, celecoxib, pyridOXINE, Vitamin D3, and Nutritional Supplements (ARTHRO-COMPLEX PO).  Meds ordered this encounter  Medications  . DISCONTD: Omega-3 Fatty Acids (FISH OIL) 1000 MG CAPS    Sig: Take by mouth.  . pyridOXINE (VITAMIN B-6) 100 MG tablet    Sig: Take 100 mg by mouth daily.  . Cholecalciferol (VITAMIN D3) 2000 units TABS    Sig: Take by mouth.  . Nutritional Supplements (ARTHRO-COMPLEX PO)    Sig: Take by mouth.  . DISCONTD: sildenafil (REVATIO) 20 MG tablet    Sig: Take 1 tablet (20 mg total) by mouth daily as needed (2-5 as needed).    Dispense:  10 tablet    Refill:  5     Follow-up: Return in about 3 months (around 12/22/2015).  Claretta Fraise, M.D.

## 2015-09-22 NOTE — Patient Instructions (Signed)
Cut gabapentin to one pill daily at bedtime for one week. Then discontinue.

## 2015-09-23 LAB — CMP14+EGFR
A/G RATIO: 1.9 (ref 1.2–2.2)
ALBUMIN: 4.1 g/dL (ref 3.5–4.8)
ALT: 24 IU/L (ref 0–44)
AST: 23 IU/L (ref 0–40)
Alkaline Phosphatase: 69 IU/L (ref 39–117)
BILIRUBIN TOTAL: 0.9 mg/dL (ref 0.0–1.2)
BUN / CREAT RATIO: 20 (ref 10–24)
BUN: 22 mg/dL (ref 8–27)
CALCIUM: 8.8 mg/dL (ref 8.6–10.2)
CO2: 28 mmol/L (ref 18–29)
Chloride: 103 mmol/L (ref 96–106)
Creatinine, Ser: 1.08 mg/dL (ref 0.76–1.27)
GFR, EST AFRICAN AMERICAN: 80 mL/min/{1.73_m2} (ref >59)
GFR, EST NON AFRICAN AMERICAN: 69 mL/min/{1.73_m2} (ref >59)
GLOBULIN, TOTAL: 2.2 g/dL (ref 1.5–4.5)
Glucose: 89 mg/dL (ref 65–99)
POTASSIUM: 4.4 mmol/L (ref 3.5–5.2)
SODIUM: 142 mmol/L (ref 134–144)
TOTAL PROTEIN: 6.3 g/dL (ref 6.0–8.5)

## 2015-09-23 LAB — LIPID PANEL
CHOL/HDL RATIO: 5.3 ratio — AB (ref 0.0–5.0)
Cholesterol, Total: 185 mg/dL (ref 100–199)
HDL: 35 mg/dL — AB (ref >39)
LDL Calculated: 111 mg/dL — ABNORMAL HIGH (ref 0–99)
Triglycerides: 195 mg/dL — ABNORMAL HIGH (ref 0–149)
VLDL Cholesterol Cal: 39 mg/dL (ref 5–40)

## 2015-09-23 LAB — CBC WITH DIFFERENTIAL/PLATELET
BASOS: 0 %
Basophils Absolute: 0 10*3/uL (ref 0.0–0.2)
EOS (ABSOLUTE): 0.1 10*3/uL (ref 0.0–0.4)
EOS: 2 %
HEMATOCRIT: 40.8 % (ref 37.5–51.0)
HEMOGLOBIN: 13.8 g/dL (ref 12.6–17.7)
IMMATURE GRANS (ABS): 0 10*3/uL (ref 0.0–0.1)
IMMATURE GRANULOCYTES: 0 %
LYMPHS: 20 %
Lymphocytes Absolute: 1.5 10*3/uL (ref 0.7–3.1)
MCH: 29.6 pg (ref 26.6–33.0)
MCHC: 33.8 g/dL (ref 31.5–35.7)
MCV: 87 fL (ref 79–97)
MONOCYTES: 6 %
Monocytes Absolute: 0.5 10*3/uL (ref 0.1–0.9)
NEUTROS PCT: 72 %
Neutrophils Absolute: 5.4 10*3/uL (ref 1.4–7.0)
Platelets: 137 10*3/uL — ABNORMAL LOW (ref 150–379)
RBC: 4.67 x10E6/uL (ref 4.14–5.80)
RDW: 13.5 % (ref 12.3–15.4)
WBC: 7.5 10*3/uL (ref 3.4–10.8)

## 2015-09-23 LAB — VITAMIN D 25 HYDROXY (VIT D DEFICIENCY, FRACTURES): VIT D 25 HYDROXY: 34.7 ng/mL (ref 30.0–100.0)

## 2015-09-23 NOTE — Progress Notes (Signed)
Patient aware.

## 2015-09-24 ENCOUNTER — Other Ambulatory Visit: Payer: Self-pay | Admitting: *Deleted

## 2015-09-24 MED ORDER — SILDENAFIL CITRATE 20 MG PO TABS: 20.0000 mg | ORAL_TABLET | Freq: Every day | ORAL | 5 refills | Status: DC | PRN

## 2015-09-26 ENCOUNTER — Encounter: Payer: Self-pay | Admitting: Family Medicine

## 2015-09-28 DIAGNOSIS — M7661 Achilles tendinitis, right leg: Secondary | ICD-10-CM | POA: Diagnosis not present

## 2015-12-07 ENCOUNTER — Ambulatory Visit: Payer: PPO | Attending: Orthopedic Surgery | Admitting: Physical Therapy

## 2015-12-07 DIAGNOSIS — M25671 Stiffness of right ankle, not elsewhere classified: Secondary | ICD-10-CM | POA: Diagnosis not present

## 2015-12-07 DIAGNOSIS — M25571 Pain in right ankle and joints of right foot: Secondary | ICD-10-CM | POA: Insufficient documentation

## 2015-12-07 NOTE — Therapy (Signed)
[facility] [address] [place], [place], [address] Phone: [phone]   Fax:  [phone]  Physical Therapy Evaluation  Patient Details  Name: Fernando Pratt MRN: [id number] Date of Birth: August 18, 1945 No Data Recorded  Encounter Date: 12/07/2015      PT End of Session - 12/07/15 1223    Visit Number 1   Number of Visits 12   Date for PT Re-Evaluation 01/04/16   PT Start Time 1118   PT Stop Time 1159   PT Time Calculation (min) 41 min   Activity Tolerance Patient tolerated treatment well   Behavior During Therapy [facility] for tasks assessed/performed      No past medical history on file.  Past Surgical History:  Procedure Laterality Date  . NECK SURGERY  1991    There were no vitals filed for this visit.       Subjective Assessment - 12/07/15 1156    Pertinent History "Big bone spur" on right heel.            [facility] Assessment - 12/07/15 0001      Assessment   Medical Diagnosis Fernando HeckJason Rogers MD   Onset Date/Surgical Date --  10 years.     Precautions   Precautions None     Restrictions   Weight Bearing Restrictions No     Balance Screen   Has the patient fallen in the past 6 months No   Has the patient had a decrease in activity level because of a fear of falling?  No   Is the patient reluctant to leave their home because of a fear of falling?  No     Home [employer] information [employer] managernvironment   Living Environment Private residence     Prior Function   Level of Independence Independent     ROM / Strength   AROM / PROM / Strength AROM;Strength     AROM   Overall AROM Comments Active right ankle dorsiflexion with knee in full extension= 4 degrees and 10 degrees with knee flexed.     Strength   Overall Strength Comments Normal right ankle strength.     Palpation   Palpation comment Palable areas of right calf pain but especially tender globally over his right Achille's tendon to calcaneal attachment.     Ambulation/Gait    Gait Comments Antalgic gait pattern with decreased right stance time due to pain with an increase in toe out.                   [facility] Adult PT Treatment/Exercise - 12/07/15 0001      Modalities   Modalities Electrical Stimulation;Moist Heat     Moist Heat Therapy   Number Minutes Moist Heat 15 Minutes   Moist Heat Location --  RT HEEL.     Programme researcher, broadcasting/film/videolectrical Stimulation   Electrical Stimulation Location RT HEEL/ACHILLES   Electrical Stimulation Action Constant Pre-mod   Electrical Stimulation Parameters 80-150 Hz x 15 minutes.   Electrical Stimulation Goals Pain                  PT Short Term Goals - 01/08/15 1727      PT SHORT TERM GOAL #1   Title I with initial HEP 11/24/14   Time 2   Period Weeks   Status Achieved           PT Long Term Goals - 12/07/15 1224      PT LONG TERM GOAL #1   Title Independent  with a HEP.   Time 4   Period Weeks   Status New     PT LONG TERM GOAL #2   Title Increase right ankle dorsiflexion to  8 degrees to normalize the patient's gait pattern   Time 4   Period Weeks   Status New     PT LONG TERM GOAL #3   Title Walk a community distance wiht pain not > 3/10.   Time 4   Period Weeks   Status New               Plan - 12/07/15 1155    Clinical Impression Statement The patient is diffusely tender over hos right Achilles tendon to its calcaneal attachment.  He has loss of a loss of dorsiflexion.  pain increases with walking.   Rehab Potential Excellent   PT Frequency 3x / week   PT Duration 4 weeks   PT Treatment/Interventions ADLs/Self Care Home Management;Cryotherapy;Electrical Stimulation;Iontophoresis 4mg /ml Dexamethasone;Moist Heat;Ultrasound;Therapeutic activities;Therapeutic exercise;Patient/family education;Passive range of motion;Manual techniques;Dry needling;Vasopneumatic Device   PT Next Visit Plan In prone:  IASTM to right calf and Achille's tendon; Dry Needling; Sending order for Ionto;  standing gastroc-soleus stretching; Rockerboard.      Patient will benefit from skilled therapeutic intervention in order to improve the following deficits and impairments:  Abnormal gait, Decreased range of motion, Pain, Decreased activity tolerance  Visit Diagnosis: Pain in right ankle and joints of right foot - Plan: PT plan of care cert/re-cert  Stiffness of right ankle, not elsewhere classified - Plan: PT plan of care cert/re-cert     Problem List Patient Active Problem List   Diagnosis Date Noted  . Spondylosis, cervical, with myelopathy 09/22/2015  . Pain in joint, shoulder region, left 09/22/2015  . Hyperlipidemia 09/22/2015  . Neuropathy (HCC) 09/22/2015  . Vitamin D deficiency 09/22/2015    Fernando Pratt, Italy[place]AD MPT 12/07/2015, 12:28 PM  [facility] [address] [place], [place], [address] Phone: [phone]   Fax:  [phone]  Name: Fernando Pratt MRN: [id number] Date of Birth: 17-Jul-1945

## 2015-12-09 ENCOUNTER — Encounter: Payer: Self-pay | Admitting: Physical Therapy

## 2015-12-09 ENCOUNTER — Ambulatory Visit: Payer: PPO | Admitting: Physical Therapy

## 2015-12-09 DIAGNOSIS — M25571 Pain in right ankle and joints of right foot: Secondary | ICD-10-CM | POA: Diagnosis not present

## 2015-12-09 DIAGNOSIS — M25671 Stiffness of right ankle, not elsewhere classified: Secondary | ICD-10-CM

## 2015-12-09 NOTE — Therapy (Signed)
[facility] [address] [place], [place], [address] Phone: [phone]   Fax:  [phone]  Physical Therapy Treatment  Patient Details  Name: Fernando Pratt MRN: [id number] Date of Birth: [birth year] No Data Recorded  Encounter Date: 12/09/2015      PT End of Session - 12/09/15 0848    Visit Number 2   Number of Visits 12   Date for PT Re-Evaluation 01/04/16   PT Start Time 0736   PT Stop Time 0831   PT Time Calculation (min) 55 min   Activity Tolerance Patient tolerated treatment well   Behavior During Therapy [facility] for tasks assessed/performed      History reviewed. No pertinent past medical history.  Past Surgical History:  Procedure Laterality Date  . NECK SURGERY  1991    There were no vitals filed for this visit.      Subjective Assessment - 12/09/15 0738    Subjective Patient reports minimal  ongoing pain today and has a burning sensation at times with numbess   Pertinent History "Big bone spur" on right heel.   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 Yes   Pain Score 2    Pain Location Heel   Pain Orientation Right   Pain Descriptors / Indicators Aching   Pain Type Chronic pain   Pain Onset More than a month ago   Pain Frequency Constant   Aggravating Factors  prolong walking   Pain Relieving Factors at rest                         [facility] Treatment/Exercise - 12/09/15 0001      Exercises   Exercises Ankle     Modalities   Modalities Electrical Stimulation;Iontophoresis;Moist Heat;Ultrasound     Moist Heat Therapy   Number Minutes Moist Heat 10 Minutes   Moist Heat Location Ankle     Electrical Stimulation   Electrical Stimulation Location RT HEEL/ACHILLES   Electrical Stimulation Action premod   Electrical Stimulation Parameters 80-150hz  x5910min   Electrical Stimulation Goals Pain     Ultrasound   Ultrasound Location right achilles   Ultrasound Parameters 1.5w/cm2/50%/3.653mhz x88min   Ultrasound Goals Pain     Iontophoresis   Type of Iontophoresis Dexamethasone   Location right achilles   Dose 1ml 1 of 6   Time 8     Manual Therapy   Manual Therapy Soft tissue mobilization   Soft tissue mobilization IASTM to right achilles      Ankle Exercises: Stretches   Soleus Stretch 3 reps;30 seconds   Gastroc Stretch 30 seconds;3 reps     Ankle Exercises: Standing   Rocker Board 4 minutes   Heel Raises 20 reps                  PT Short Term Goals - 01/08/15 1727      PT SHORT TERM GOAL #1   Title I with initial HEP 11/24/14   Time 2   Period Weeks   Status Achieved           PT Long Term Goals - 12/09/15 [id number]      PT LONG TERM GOAL #1   Title Independent with a HEP.   Time 4   Period Weeks   Status On-going     PT LONG TERM GOAL #2  Title Increase right ankle dorsiflexion to  8 degrees to normalize the patient's gait pattern   Time 4   Period Weeks   Status On-going     PT LONG TERM GOAL #3   Title Walk a community distance with pain not > 3/10.   Time 4   Period Weeks   Status On-going               Plan - 12/09/15 0854    Clinical Impression Statement Patient tolerated treatment well today. Patient has reported ongoing pain with prolong walking which causes him to ambulate with an antalgic gait. Patient reported he felt relief after treatment. Patient current goals ongoing due to pain and ROM deficts.   Rehab Potential Excellent   Clinical Impairments Affecting Rehab Potential weak grip strength   PT Frequency 3x / week   PT Duration 4 weeks   PT Treatment/Interventions ADLs/Self Care Home Management;Cryotherapy;Electrical Stimulation;Iontophoresis 4mg /ml Dexamethasone;Moist Heat;Ultrasound;Therapeutic activities;Therapeutic exercise;Patient/family education;Passive range of motion;Manual techniques;Dry  needling;Vasopneumatic Device   PT Next Visit Plan cont with POC per MPT   Consulted and Agree with Plan of Care Patient      Patient will benefit from skilled therapeutic intervention in order to improve the following deficits and impairments:  Abnormal gait, Decreased range of motion, Pain, Decreased activity tolerance  Visit Diagnosis: Pain in right ankle and joints of right foot  Stiffness of right ankle, not elsewhere classified     Problem List Patient Active Problem List   Diagnosis Date Noted  . Spondylosis, cervical, with myelopathy 09/22/2015  . Pain in joint, shoulder region, left 09/22/2015  . Hyperlipidemia 09/22/2015  . Neuropathy (HCC) 09/22/2015  . Vitamin D deficiency 09/22/2015    Hermelinda DellenDUNFORD, Naelani Lafrance P, PTA 12/09/2015, 9:19 AM  [facility] [address] [place], [place], [address] Phone: [phone]   Fax:  [phone]  Name: Fernando Pratt MRN: [id number] Date of Birth: May 13, 1945

## 2015-12-14 ENCOUNTER — Encounter: Payer: Self-pay | Admitting: Physical Therapy

## 2015-12-14 ENCOUNTER — Ambulatory Visit: Payer: PPO | Admitting: Physical Therapy

## 2015-12-14 DIAGNOSIS — M25571 Pain in right ankle and joints of right foot: Secondary | ICD-10-CM

## 2015-12-14 DIAGNOSIS — M25671 Stiffness of right ankle, not elsewhere classified: Secondary | ICD-10-CM

## 2015-12-14 NOTE — Therapy (Signed)
[facility] [address] [place], [place], [address] Phone: [phone]   Fax:  [phone]  Physical Therapy Treatment  Patient Details  Name: Fernando Pratt MRN: [id number] Date of Birth: May 02, 1945 No Data Recorded  Encounter Date: 12/14/2015      PT End of Session - 12/14/15 0816    Visit Number 3   Number of Visits 12   Date for PT Re-Evaluation 01/04/16   PT Start Time 0815   PT Stop Time 0904   PT Time Calculation (min) 49 min   Activity Tolerance Patient tolerated treatment well   Behavior During Therapy [facility] for tasks assessed/performed      No past medical history on file.  Past Surgical History:  Procedure Laterality Date  . NECK SURGERY  1991    There were no vitals filed for this visit.      Subjective Assessment - 12/14/15 0816    Subjective Reports that R foot is good and denies any pain. Reports that he still has numbness along the bottom of his foot.   Pertinent History "Big bone spur" on right heel.   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 No/denies            [facility] - 12/14/15 0001      Assessment   Medical Diagnosis Duwayne HeckJason Rogers MD   Next MD Visit Anytime, "do therapy first"     Precautions   Precautions None     Restrictions   Weight Bearing Restrictions No                     [facility] Adult PT Treatment/Exercise - 12/14/15 0001      Modalities   Modalities Cryotherapy;Electrical Stimulation;Ultrasound;Iontophoresis     Cryotherapy   Number Minutes Cryotherapy 15 Minutes   Cryotherapy Location Ankle   Type of Cryotherapy Ice pack     Electrical Stimulation   Electrical Stimulation Location R Achilles Region   Statisticianlectrical Stimulation Action Pre-Mod   Electrical Stimulation Parameters 80-150 hz x15 min   Electrical Stimulation Goals Pain;Edema     Ultrasound   Ultrasound Location R Achilles Tendon   Ultrasound Parameters 1.5 w/cm2, 3.3 mhz, 100% x10 min   Ultrasound Goals Pain     Iontophoresis   Type of Iontophoresis Dexamethasone   Location right achilles   Dose 1ml 2 of 6   Time 8     Ankle Exercises: Standing   Rocker Board 5 minutes   Heel Raises 20 reps   Toe Raise 20 reps     Ankle Exercises: Stretches   Soleus Stretch 3 reps;30 seconds   Gastroc Stretch 3 reps;30 seconds                     PT Long Term Goals - 12/09/15 [id number]      PT LONG TERM GOAL #1   Title Independent with a HEP.   Time 4   Period Weeks   Status On-going     PT LONG TERM GOAL #2   Title Increase right ankle dorsiflexion to  8 degrees to normalize the patient's gait pattern   Time 4   Period Weeks   Status On-going     PT LONG TERM GOAL #3   Title Walk a community distance with pain not > 3/10.   Time 4   Period Weeks  Status On-going               Plan - 12/14/15 0847    Clinical Impression Statement Patient tolerated today's treatment well with no reports of increased pain or discomfort with any modality or exercise completed today. Patient arrived denying any R ankle pain and stated that he felt as he didn't really need the iontophoresis patch as he thought it was for pain. Patient educated that dexmathasone utilized in patch was an anti-inflammatory and patient agreed to another patch secondary to continued R ankle inflammation. Patient did report that if he felt any pain it was along R lateral malleolus which was observed with inflammation surrounding it. Cryotherapy used with electrical stimulation to decrease R anke inflammation. Patient educated to use ice at home at least once a day for 10-15 minutes at a time to decrease swelling.   Rehab Potential Excellent   PT Frequency 3x / week   PT Duration 4 weeks   PT Treatment/Interventions ADLs/Self Care Home Management;Cryotherapy;Electrical Stimulation;Iontophoresis 4mg /ml  Dexamethasone;Moist Heat;Ultrasound;Therapeutic activities;Therapeutic exercise;Patient/family education;Passive range of motion;Manual techniques;Dry needling;Vasopneumatic Device   PT Next Visit Plan cont with POC per MPT   Consulted and Agree with Plan of Care Patient      Patient will benefit from skilled therapeutic intervention in order to improve the following deficits and impairments:  Abnormal gait, Decreased range of motion, Pain, Decreased activity tolerance  Visit Diagnosis: Pain in right ankle and joints of right foot  Stiffness of right ankle, not elsewhere classified     Problem List Patient Active Problem List   Diagnosis Date Noted  . Spondylosis, cervical, with myelopathy 09/22/2015  . Pain in joint, shoulder region, left 09/22/2015  . Hyperlipidemia 09/22/2015  . Neuropathy (HCC) 09/22/2015  . Vitamin D deficiency 09/22/2015    Evelene CroonKelsey M Parsons, PTA 12/14/2015, 9:10 AM  [facility] [address] [place], [place], [address] Phone: [phone]   Fax:  [phone]  Name: Fernando Pratt MRN: [id number] Date of Birth: [birth year]

## 2015-12-17 ENCOUNTER — Ambulatory Visit: Payer: PPO | Admitting: *Deleted

## 2015-12-17 DIAGNOSIS — M25571 Pain in right ankle and joints of right foot: Secondary | ICD-10-CM

## 2015-12-17 DIAGNOSIS — M25671 Stiffness of right ankle, not elsewhere classified: Secondary | ICD-10-CM

## 2015-12-17 NOTE — Therapy (Signed)
[facility] [address] [place], [place], [address] Phone: [phone]   Fax:  [phone]  Physical Therapy Treatment  Patient Details  Name: Fernando Pratt MRN: [id number] Date of Birth: [birth year] No Data Recorded  Encounter Date: 12/17/2015      PT End of Session - 12/17/15 [id number]    Visit Number 4   Number of Visits 12   Date for PT Re-Evaluation 01/04/16   PT Start Time 0815   PT Stop Time 0905   PT Time Calculation (min) 50 min      No past medical history on file.  Past Surgical History:  Procedure Laterality Date  . NECK SURGERY  1991    There were no vitals filed for this visit.      Subjective Assessment - 12/17/15 0821    Subjective Reports that R foot is good and denies any pain mainly soreness now. Reports that he still has numbness along the bottom of his foot.   Pertinent History "Big bone spur" on right heel.   Currently in Pain? No/denies   Pain Location Heel   Pain Orientation Right   Pain Descriptors / Indicators Aching                         [facility] Adult PT Treatment/Exercise - 12/17/15 0001      Exercises   Exercises Ankle     Modalities   Modalities Cryotherapy;Electrical Stimulation;Ultrasound;Iontophoresis;Vasopneumatic     Programme researcher, broadcasting/film/videolectrical Stimulation   Electrical Stimulation Location RT achilles 1-10 hz x 15 mins   Electrical Stimulation Goals Pain;Edema     Ultrasound   Ultrasound Location RT achilles   Ultrasound Parameters 1.5 w/cm2 x 8 mins   Ultrasound Goals Pain     Iontophoresis   Type of Iontophoresis Dexamethasone   Location right achilles   Dose 1ml 3 of 6   Time 8     Vasopneumatic   Number Minutes Vasopneumatic  15 minutes   Vasopnuematic Location  Ankle   Vasopneumatic Pressure Low   Vasopneumatic Temperature  36     Manual Therapy   Manual Therapy Soft tissue mobilization   Soft tissue mobilization STW to achilles     Ankle Exercises:  Stretches   Soleus Stretch 3 reps;30 seconds   Gastroc Stretch 3 reps;30 seconds     Ankle Exercises: Standing   Rocker Board 5 minutes   Heel Raises 20 reps   Toe Raise 20 reps                     PT Long Term Goals - 12/09/15 [id number]      PT LONG TERM GOAL #1   Title Independent with a HEP.   Time 4   Period Weeks   Status On-going     PT LONG TERM GOAL #2   Title Increase right ankle dorsiflexion to  8 degrees to normalize the patient's gait pattern   Time 4   Period Weeks   Status On-going     PT LONG TERM GOAL #3   Title Walk a community distance with pain not > 3/10.   Time 4   Period Weeks   Status On-going               Plan - 12/17/15 0824    Clinical Impression Statement Pt arrived to clinic with minimal pain in RT achilles, but complained about how weak it is. He cannot PF his  wt in SLS, but he can with both. He was given yellow Tband for PF strengthening for HEP . He did well with STW  and other modalities.   PT Frequency 3x / week   PT Duration 4 weeks   PT Treatment/Interventions ADLs/Self Care Home Management;Cryotherapy;Electrical Stimulation;Iontophoresis 4mg /ml Dexamethasone;Moist Heat;Ultrasound;Therapeutic activities;Therapeutic exercise;Patient/family education;Passive range of motion;Manual techniques;Dry needling;Vasopneumatic Device   PT Next Visit Plan cont with POC per MPT   Consulted and Agree with Plan of Care Patient      Patient will benefit from skilled therapeutic intervention in order to improve the following deficits and impairments:  Abnormal gait, Decreased range of motion, Pain, Decreased activity tolerance  Visit Diagnosis: Pain in right ankle and joints of right foot  Stiffness of right ankle, not elsewhere classified     Problem List Patient Active Problem List   Diagnosis Date Noted  . Spondylosis, cervical, with myelopathy 09/22/2015  . Pain in joint, shoulder region, left 09/22/2015  . Hyperlipidemia  09/22/2015  . Neuropathy (HCC) 09/22/2015  . Vitamin D deficiency 09/22/2015    Charnika Herbst,[name], PTA 12/17/2015, 9:06 AM  [facility] [address] [place], [place], [address] Phone: [phone]   Fax:  [phone]  Name: Fernando Pratt MRN: [id number] Date of Birth: 12/14/1945

## 2015-12-21 ENCOUNTER — Ambulatory Visit: Payer: PPO | Admitting: Physical Therapy

## 2015-12-21 ENCOUNTER — Encounter: Payer: Self-pay | Admitting: Physical Therapy

## 2015-12-21 DIAGNOSIS — M25571 Pain in right ankle and joints of right foot: Secondary | ICD-10-CM | POA: Diagnosis not present

## 2015-12-21 DIAGNOSIS — M25671 Stiffness of right ankle, not elsewhere classified: Secondary | ICD-10-CM

## 2015-12-21 NOTE — Therapy (Signed)
Fernando Pratt, [place], [address] Phone: [phone]   Fax:  [phone]  Physical Therapy Treatment  Patient Details  Name: Fernando Pratt MRN: [id number] Date of Birth: July 03, 1945 No Data Recorded  Encounter Date: 12/21/2015      PT End of Session - 12/21/15 0813    Visit Number 5   Number of Visits 12   Date for PT Re-Evaluation 01/04/16   PT Start Time 0733   PT Stop Time 0830   PT Time Calculation (min) 57 min   Activity Tolerance Patient tolerated treatment well   Behavior During Therapy [facility] for tasks assessed/performed      History reviewed. No pertinent past medical history.  Past Surgical History:  Procedure Laterality Date  . NECK SURGERY  1991    There were no vitals filed for this visit.      Subjective Assessment - 12/21/15 0740    Subjective Patient reported continued improvement overall   Pertinent History "Big bone spur" on right heel.   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 No/denies   Pain Score --   Pain Location Heel   Pain Orientation Right   Pain Descriptors / Indicators Numbness   Pain Type Chronic pain   Pain Onset More than a month ago   Pain Frequency Constant   Aggravating Factors  prolong walking   Pain Relieving Factors at rest            [facility] PT Assessment - 12/21/15 0001      ROM / Strength   AROM / PROM / Strength AROM     AROM   AROM Assessment Site Ankle   Right/Left Ankle Right   Right Ankle Dorsiflexion 10                     [facility] Adult PT Treatment/Exercise - 12/21/15 0001      Electrical Stimulation   Electrical Stimulation Location RT achilles 1-10 hz x 15 mins   Electrical Stimulation Goals Pain;Edema     Ultrasound   Ultrasound Location right achilles   Ultrasound Parameters 1.5w/cm2/50%/3.68mz x110m     Iontophoresis   Type of  Iontophoresis Dexamethasone   Location right achilles   Dose 52m33m of 6   Time 8     Vasopneumatic   Number Minutes Vasopneumatic  15 minutes   Vasopnuematic Location  Ankle   Vasopneumatic Pressure Low     Ankle Exercises: Standing   Rocker Board 5 minutes   Heel Raises 20 reps   Toe Raise 20 reps   Other Standing Ankle Exercises 6"step eccentric lowering heel lifts     Ankle Exercises: Stretches   Soleus Stretch 3 reps;30 seconds   Gastroc Stretch 3 reps;30 seconds     Ankle Exercises: Supine   T-Band red t-band for DF 2x10                PT Education - 12/21/15 0818    Education provided Yes   Education Details HEP   Person(s) Educated Patient   Methods Explanation;Demonstration;Handout   Comprehension Verbalized understanding;Returned demonstration             PT Long Term Goals - 12/21/15 0819      PT LONG TERM GOAL #1   Title Independent with a HEP.   Time 4   Period Weeks  Status Achieved     PT LONG TERM GOAL #2   Title Increase right ankle dorsiflexion to  8 degrees to normalize the patient's gait pattern   Time 4   Period Weeks   Status Achieved  AROM 10 degrees DF 12/21/15     PT LONG TERM GOAL #3   Title Walk a community distance with pain not > 3/10.   Time 4   Period Weeks   Status On-going               Plan - 12/21/15 0829    Clinical Impression Statement Patient progressing overall and has reported he feels progress up to 50% and has responded well after each treatment. Patient has reported more numbness in achilles and medial heel at times and increases with activity. Patient has improved AROM in DF and less pain and symptoms reported. Patient has complaints of difficulty with heel raises on right LE and difficulty on ladder at work. HEP given today. Patient met LTG #1 and #2 today, remaining goal ongoing due to ongoing symptoms in heel.   Rehab Potential Excellent   PT Frequency 3x / week   PT Duration 4 weeks   PT  Treatment/Interventions ADLs/Self Care Home Management;Cryotherapy;Electrical Stimulation;Iontophoresis 47m/ml Dexamethasone;Moist Heat;Ultrasound;Therapeutic activities;Therapeutic exercise;Patient/family education;Passive range of motion;Manual techniques;Dry needling;Vasopneumatic Device   PT Next Visit Plan cont with POC per MPT   Consulted and Agree with Plan of Care Patient      Patient will benefit from skilled therapeutic intervention in order to improve the following deficits and impairments:  Abnormal gait, Decreased range of motion, Pain, Decreased activity tolerance  Visit Diagnosis: Pain in right ankle and joints of right foot  Stiffness of right ankle, not elsewhere classified     Problem List Patient Active Problem List   Diagnosis Date Noted  . Spondylosis, cervical, with myelopathy 09/22/2015  . Pain in joint, shoulder region, left 09/22/2015  . Hyperlipidemia 09/22/2015  . [place]urop[place] 09/22/[place]  . Vitamin D deficiency 09/22/2015    DPhillips Climes PTA 12/21/2015, 8:35 AM  [facility] [place] [address]Phone: [phone]  Fax:  [phone] Name: Fernando CHICHESTERMRN: [id number] of Birth: [birth year]

## 2015-12-21 NOTE — Patient Instructions (Signed)
   Heel Raise: Bilateral (Standing)   Rise on balls of feet. Repeat __10__ times per set. Do __2__ sets per session. Do __2__ sessions per day.    Heel Raise: Standing   Toes on board, heels on floor, knees slightly bent, rise up on toes as high as possible. Do _2___ sets. Complete _10___ repetitions.    Heel Raise: Unilateral (Standing)   Balance on left foot, then rise on ball of foot. Repeat __5-10__ times per set. Do _2___ sets per session. Do __2__ sessions per day.

## 2015-12-24 ENCOUNTER — Encounter: Payer: Self-pay | Admitting: Physical Therapy

## 2015-12-24 ENCOUNTER — Ambulatory Visit: Payer: PPO | Admitting: Physical Therapy

## 2015-12-24 DIAGNOSIS — M25671 Stiffness of right ankle, not elsewhere classified: Secondary | ICD-10-CM

## 2015-12-24 DIAGNOSIS — M25571 Pain in right ankle and joints of right foot: Secondary | ICD-10-CM | POA: Diagnosis not present

## 2015-12-24 NOTE — Therapy (Signed)
[facility] [address] [place], [place], [address] Phone: [phone]   Fax:  [phone]  Physical Therapy Treatment  Patient Details  Name: Fernando Pratt MRN: [id number] Date of Birth: [birth year] No Data Recorded  Encounter Date: 12/24/2015      PT End of Session - 12/24/15 0745    Visit Number 6   Number of Visits 12   Date for PT Re-Evaluation 01/04/16   PT Start Time 0732   PT Stop Time 0828   PT Time Calculation (min) 56 min   Activity Tolerance Patient tolerated treatment well   Behavior During Therapy [facility] for tasks assessed/performed      History reviewed. No pertinent past medical history.  Past Surgical History:  Procedure Laterality Date  . NECK SURGERY  1991    There were no vitals filed for this visit.      Subjective Assessment - 12/24/15 0733    Subjective Patient reported continued improvement overall   Pertinent History "Big bone spur" on right heel.   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 No/denies                         [facility] - 12/24/15 0001      Electrical Stimulation   Electrical Stimulation Location RT achilles 1-10 hz x 15 mins   Electrical Stimulation Goals Pain;Edema     Ultrasound   Ultrasound Location right achilles   Ultrasound Parameters 1.5w/cm2/50%/3.573mhzx10min   Ultrasound Goals Pain     Iontophoresis   Type of Iontophoresis Dexamethasone   Location right achilles   Dose 1ml 5 of 6   Time 8     Vasopneumatic   Number Minutes Vasopneumatic  15 minutes   Vasopnuematic Location  Ankle   Vasopneumatic Pressure Low     Ankle Exercises: Standing   Rocker Board 5 minutes   Heel Raises 20 reps   Toe Raise 20 reps   Other Standing Ankle Exercises 6"step eccentric lowering heel lifts     Ankle Exercises: Stretches   Soleus Stretch 3 reps;30  seconds   Gastroc Stretch 3 reps;30 seconds     Ankle Exercises: Supine   T-Band red t-band for DF 2x10                     PT Long Term Goals - 12/21/15 [id number]      PT LONG TERM GOAL #1   Title Independent with a HEP.   Time 4   Period Weeks   Status Achieved     PT LONG TERM GOAL #2   Title Increase right ankle dorsiflexion to  8 degrees to normalize the patient's gait pattern   Time 4   Period Weeks   Status Achieved  AROM 10 degrees DF 12/21/15     PT LONG TERM GOAL #3   Title Walk a community distance with pain not > 3/10.   Time 4   Period Weeks   Status On-going               Plan - 12/24/15 0746    Clinical Impression Statement Patient progressing overall and continues to report improvement after each visit. Patient has less pain symptoms unless he is standing or walking for prolong time or with certain movements. Patient has ongoing numbness symptoms and swelling reported in  achilles and medial heel. Current goals ongoing due to remaining symptoms in heel.   Rehab Potential Excellent   PT Frequency 3x / week   PT Duration 4 weeks   PT Treatment/Interventions ADLs/Self Care Home Management;Cryotherapy;Electrical Stimulation;Iontophoresis 4mg /ml Dexamethasone;Moist Heat;Ultrasound;Therapeutic activities;Therapeutic exercise;Patient/family education;Passive range of motion;Manual techniques;Dry needling;Vasopneumatic Device   Consulted and Agree with Plan of Care Patient      Patient will benefit from skilled therapeutic intervention in order to improve the following deficits and impairments:  Abnormal gait, Decreased range of motion, Pain, Decreased activity tolerance  Visit Diagnosis: Pain in right ankle and joints of right foot  Stiffness of right ankle, not elsewhere classified     Problem List Patient Active Problem List   Diagnosis Date Noted  . Spondylosis, cervical, with myelopathy 09/22/2015  . Pain in joint, shoulder region,  left 09/22/2015  . Hyperlipidemia 09/22/2015  . Neuropathy (HCC) 09/22/2015  . Vitamin D deficiency 09/22/2015    Hermelinda DellenDUNFORD, Menashe Kafer P, PTA 12/24/2015, 8:49 AM  [facility] [address] [place], [place], [address] Phone: [phone]   Fax:  [phone]  Name: Fernando Pratt MRN: [id number] Date of Birth: 03/07/1945

## 2015-12-30 ENCOUNTER — Ambulatory Visit: Payer: PPO | Admitting: *Deleted

## 2015-12-30 DIAGNOSIS — M25571 Pain in right ankle and joints of right foot: Secondary | ICD-10-CM | POA: Diagnosis not present

## 2015-12-30 DIAGNOSIS — M25671 Stiffness of right ankle, not elsewhere classified: Secondary | ICD-10-CM

## 2015-12-30 NOTE — Therapy (Signed)
[facility] [address] [place], [place], [address] Phone: [phone]   Fax:  [phone]  Physical Therapy Treatment  Patient Details  Name: Fernando KehrRichard B Pratt MRN: [id number] Date of Birth: 1945/03/19 No Data Recorded  Encounter Date: 12/30/2015      PT End of Session - 12/30/15 0818    Visit Number 7   Number of Visits 12   Date for PT Re-Evaluation 01/04/16   PT Start Time 0817   PT Stop Time 0900   PT Time Calculation (min) 43 min      No past medical history on file.  Past Surgical History:  Procedure Laterality Date  . NECK SURGERY  1991    There were no vitals filed for this visit.      Subjective Assessment - 12/30/15 0820    Subjective Patient reported continued improvement overall. RT calf soreness   Pertinent History "Big bone spur" on right heel.   Pain Score 1    Pain Location Heel   Pain Orientation Right   Pain Descriptors / Indicators Numbness   Pain Type Chronic pain   Pain Onset More than a month ago                         [facility] Treatment/Exercise - 12/30/15 0001      Exercises   Exercises Ankle     Electrical Stimulation   Electrical Stimulation Location RT achilles 1-10 hz x 15 mins   Electrical Stimulation Goals Pain;Edema     Ultrasound   Ultrasound Location Rt achilles   Ultrasound Goals Pain     Iontophoresis   Type of Iontophoresis Dexamethasone   Location right achilles   Dose 6/6   Time 8     Vasopneumatic   Number Minutes Vasopneumatic  --   Vasopnuematic Location  --   Vasopneumatic Pressure --   Vasopneumatic Temperature  --     Manual Therapy   Manual Therapy Soft tissue mobilization   Soft tissue mobilization STW to achilles     Ankle Exercises: Standing   Rocker Board 5 minutes   Other Standing Ankle Exercises 6"step eccentric lowering heel lifts                     PT Long Term Goals - 12/21/15 [id number]      PT LONG  TERM GOAL #1   Title Independent with a HEP.   Time 4   Period Weeks   Status Achieved     PT LONG TERM GOAL #2   Title Increase right ankle dorsiflexion to  8 degrees to normalize the patient's gait pattern   Time 4   Period Weeks   Status Achieved  AROM 10 degrees DF 12/21/15     PT LONG TERM GOAL #3   Title Walk a community distance with pain not > 3/10.   Time 4   Period Weeks   Status On-going               Plan - 12/30/15 0818    Clinical Impression Statement Pt continues to do well with PT Rxs. His active PF motion is improving and he is able to perform Bil. Heel ups now with minimal pain. His LTGs are ongoiung due to remaining Symptoms in heel.   Rehab Potential Excellent   Clinical Impairments Affecting Rehab Potential weak grip strength   PT Frequency 3x / week   PT  Duration 4 weeks   PT Treatment/Interventions ADLs/Self Care Home Management;Cryotherapy;Electrical Stimulation;Iontophoresis 4mg /ml Dexamethasone;Moist Heat;Ultrasound;Therapeutic activities;Therapeutic exercise;Patient/family education;Passive range of motion;Manual techniques;Dry needling;Vasopneumatic Device   Consulted and Agree with Plan of Care Patient      Patient will benefit from skilled therapeutic intervention in order to improve the following deficits and impairments:  Abnormal gait, Decreased range of motion, Pain, Decreased activity tolerance  Visit Diagnosis: Pain in right ankle and joints of right foot  Stiffness of right ankle, not elsewhere classified     Problem List Patient Active Problem List   Diagnosis Date Noted  . Spondylosis, cervical, with myelopathy 09/22/2015  . Pain in joint, shoulder region, left 09/22/2015  . Hyperlipidemia 09/22/2015  . Neuropathy (HCC) 09/22/2015  . Vitamin D deficiency 09/22/2015    [name],[name], PTA 12/30/2015, 5:27 PM  [facility] [address] [place], [place], [address] Phone:  [phone]   Fax:  [phone]  Name: Fernando KehrRichard B Pratt MRN: [id number] Date of Birth: [birth year]

## 2016-01-01 ENCOUNTER — Ambulatory Visit: Payer: PPO | Admitting: *Deleted

## 2016-01-01 DIAGNOSIS — M25571 Pain in right ankle and joints of right foot: Secondary | ICD-10-CM

## 2016-01-01 DIAGNOSIS — M25671 Stiffness of right ankle, not elsewhere classified: Secondary | ICD-10-CM

## 2016-01-01 NOTE — Therapy (Signed)
[facility] [place], [place], [address] Phone: [phone]   Fax:  [phone]  Physical Therapy Treatment  Patient Details  Name: Fernando Pratt MRN: [id number] Date of Birth: Feb 13, 1945 No Data Recorded  Encounter Date: 01/01/2016      PT End of Session - 01/01/16 0919    Visit Number 8   Date for PT Re-Evaluation 01/04/16   PT Start Time 0815   PT Stop Time 0901   PT Time Calculation (min) 46 min      No past medical history on file.  Past Surgical History:  Procedure Laterality Date  . NECK SURGERY  1991    There were no vitals filed for this visit.      Subjective Assessment - 01/01/16 0827    Subjective Patient reported continued improvement overall. RT calf has lesws  soreness.    Pertinent History "Big bone spur" on right heel.   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 Yes   Pain Score 1    Pain Orientation Right   Pain Descriptors / Indicators Numbness;Sore   Pain Onset More than a month ago                         [facility] Adult PT Treatment/Exercise - 01/01/16 0001      Exercises   Exercises Ankle     Electrical Stimulation   Electrical Stimulation Location RT achilles 1-10 hz x 15 mins   Electrical Stimulation Goals Pain;Edema     Ultrasound   Ultrasound Location RT achilles   Ultrasound Parameters 1.5 w/cm2 x 10 mins 50%   Ultrasound Goals Pain     Manual Therapy   Manual Therapy Soft tissue mobilization   Soft tissue mobilization STW to achilles     Ankle Exercises: Standing   Rocker Board 5 minutes   Heel Raises 20 reps   Toe Raise 20 reps                     PT Long Term Goals - 12/21/15 2992      PT LONG TERM GOAL #1   Title Independent with a HEP.   Time 4   Period Weeks   Status Achieved     PT LONG TERM GOAL #2   Title Increase right ankle  dorsiflexion to  8 degrees to normalize the patient's gait pattern   Time 4   Period Weeks   Status Achieved  AROM 10 degrees DF 12/21/15     PT LONG TERM GOAL #3   Title Walk a community distance with pain not > 3/10.   Time 4   Period Weeks   Status On-going               Plan - 01/01/16 0915    Clinical Impression Statement Pt did great again today and was able to obtain 12 degrees of active DF. He has met all LTGs except community distance walking due to soreness >3/10 at times and is ongoing   PT Frequency 3x / week   PT Duration 4 weeks   PT Treatment/Interventions ADLs/Self Care Home Management;Cryotherapy;Electrical Stimulation;Iontophoresis 69m/ml Dexamethasone;Moist Heat;Ultrasound;Therapeutic activities;Therapeutic exercise;Patient/family education;Passive range of motion;Manual techniques;Dry needling;Vasopneumatic Device   PT Next Visit Plan cont with POC per MPT DC IONTO 6/6   Consulted and Agree with Plan of Care Patient  Patient will benefit from skilled therapeutic intervention in order to improve the following deficits and impairments:  Abnormal gait, Decreased range of motion, Pain, Decreased activity tolerance  Visit Diagnosis: Pain in right ankle and joints of right foot  Stiffness of right ankle, not elsewhere classified     Problem List Patient Active Problem List   Diagnosis Date Noted  . Spondylosis, cervical, with myelopathy 09/22/2015  . Pain in joint, shoulder region, left 09/22/2015  . Hyperlipidemia 09/22/2015  . Neuropathy ([place]) 09/22/2015  . Vitamin D deficiency 09/22/2015    Fernando Pratt,Fernando Pratt, PTA 01/01/2016, 9:19 AM  [facility] [address] [place], [place], [address] Phone: [phone]   Fax:  [phone]  Name: Fernando Pratt MRN: [id number] Date of Birth: 21-Aug-1945

## 2016-01-06 ENCOUNTER — Ambulatory Visit: Payer: PPO | Attending: Orthopedic Surgery | Admitting: Physical Therapy

## 2016-01-06 ENCOUNTER — Encounter: Payer: Self-pay | Admitting: Physical Therapy

## 2016-01-06 DIAGNOSIS — M25571 Pain in right ankle and joints of right foot: Secondary | ICD-10-CM | POA: Diagnosis not present

## 2016-01-06 DIAGNOSIS — M25671 Stiffness of right ankle, not elsewhere classified: Secondary | ICD-10-CM | POA: Insufficient documentation

## 2016-01-06 NOTE — Therapy (Signed)
[facility] [address] [place], [place], [address] Phone: [phone]   Fax:  [phone]  Physical Therapy Treatment  Patient Details  Name: Fernando Pratt MRN: [id number] Date of Birth: June 16, 1945 No Data Recorded  Encounter Date: 01/06/2016      PT End of Session - 01/06/16 0849    Visit Number 9   Number of Visits 12   Date for PT Re-Evaluation 01/04/16   PT Start Time 0820   PT Stop Time 0911   PT Time Calculation (min) 51 min   Activity Tolerance Patient tolerated treatment well   Behavior During Therapy [facility] for tasks assessed/performed      History reviewed. No pertinent past medical history.  Past Surgical History:  Procedure Laterality Date  . NECK SURGERY  1991    There were no vitals filed for this visit.      Subjective Assessment - 01/06/16 [id number]    Subjective Patient continues to report improvement   Pertinent History "Big bone spur" on right heel.   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 Yes   Pain Score 1    Pain Location Heel   Pain Orientation Right   Pain Descriptors / Indicators Sore   Pain Type Chronic pain   Pain Onset More than a month ago   Aggravating Factors  prolong walking   Pain Relieving Factors at rest                         [facility]/Exercise - 01/06/16 0001      Electrical Stimulation   Electrical Stimulation Location RT achilles 1-10 hz x 15 mins   Electrical Stimulation Goals Pain;Edema     Ultrasound   Ultrasound Location rt achilles   Ultrasound Parameters 1.5w/cm2/50%/3.543mhz x 10min   Ultrasound Goals Pain     Manual Therapy   Manual Therapy Soft tissue mobilization   Soft tissue mobilization STW to achilles     Ankle Exercises: Standing   Rocker Board 5 minutes   Heel Raises 20 reps   Toe Raise 20 reps   Other Standing Ankle Exercises 6"step  eccentric lowering heel lifts                     PT Long Term Goals - 12/21/15 [id number]      PT LONG TERM GOAL #1   Title Independent with a HEP.   Time 4   Period Weeks   Status Achieved     PT LONG TERM GOAL #2   Title Increase right ankle dorsiflexion to  8 degrees to normalize the patient's gait pattern   Time 4   Period Weeks   Status Achieved  AROM 10 degrees DF 12/21/15     PT LONG TERM GOAL #3   Title Walk a community distance with pain not > 3/10.   Time 4   Period Weeks   Status On-going               Plan - 01/06/16 0850    Clinical Impression Statement Patient tolerated treatment well and has reported minimal soreness overall. Patient has no reported numbness in ankle. Patient is able to perform his work duties with greater ease. Patient close to meeting all current goals. No antalgic gait today.   Rehab Potential Excellent   PT Frequency 3x /  week   PT Duration 4 weeks   PT Next Visit Plan DC next treatment due to order date    Consulted and Agree with Plan of Care Patient      Patient will benefit from skilled therapeutic intervention in order to improve the following deficits and impairments:  Abnormal gait, Decreased range of motion, Pain, Decreased activity tolerance  Visit Diagnosis: Pain in right ankle and joints of right foot  Stiffness of right ankle, not elsewhere classified     Problem List Patient Active Problem List   Diagnosis Date Noted  . Spondylosis, cervical, with myelopathy 09/22/2015  . Pain in joint, shoulder region, left 09/22/2015  . Hyperlipidemia 09/22/2015  . Neuropathy (HCC) 09/22/2015  . Vitamin D deficiency 09/22/2015    Hermelinda Dellen, PTA 01/06/2016, 9:11 AM  [facility] [address] [place], [place], [address] Phone: [phone]   Fax:  [phone]  Name: Fernando Pratt MRN: [id number] Date of Birth: September 25, 1945

## 2016-01-08 ENCOUNTER — Encounter: Payer: Self-pay | Admitting: Physical Therapy

## 2016-01-08 ENCOUNTER — Ambulatory Visit: Payer: PPO | Admitting: Physical Therapy

## 2016-01-08 DIAGNOSIS — M25671 Stiffness of right ankle, not elsewhere classified: Secondary | ICD-10-CM

## 2016-01-08 DIAGNOSIS — M25571 Pain in right ankle and joints of right foot: Secondary | ICD-10-CM

## 2016-01-08 NOTE — Therapy (Signed)
[facility] [address] [place], [place], [address] Phone: [phone]   Fax:  [phone]  Physical Therapy Treatment  Patient Details  Name: Fernando Pratt MRN: [id number] Date of Birth: 04/20/1945 No Data Recorded  Encounter Date: 01/08/2016    No past medical history on file.  Past Surgical History:  Procedure Laterality Date  . NECK SURGERY  1991    There were no vitals filed for this visit.                                    PT Long Term Goals - 01/08/16 0821      PT LONG TERM GOAL #1   Title Independent with a HEP.   Time 4   Period Weeks   Status Achieved     PT LONG TERM GOAL #2   Title Increase right ankle dorsiflexion to  8 degrees to normalize the patient's gait pattern   Time 4   Period Weeks   Status Achieved  AROM 10 degrees DF 12/21/15     PT LONG TERM GOAL #3   Title Walk a community distance with pain not > 3/10.   Time 4   Period Weeks   Status Achieved             Patient will benefit from skilled therapeutic intervention in order to improve the following deficits and impairments:  Abnormal gait, Decreased range of motion, Pain, Decreased activity tolerance  Visit Diagnosis: Pain in right ankle and joints of right foot  Stiffness of right ankle, not elsewhere classified       G-Codes - 01/11/16 1451    Functional Assessment Tool Used Clinical judgement....10th visit and discharge.   Functional Limitation Mobility: Walking and moving around   Mobility: Walking and Moving Around Current Status (G8978) At least 1 percent but less than 20 percent impaired, limited or restricted   Mobility: Walking and Moving Around Goal Status (G8979) At least 1 percent but less than 20 percent impaired, limited or restricted   Mobility: Walking and Moving Around Discharge Status (G8980) At least 1 percent but less than 20 percent impaired, limited or restricted       Problem List Patient Active Problem List   Diagnosis Date Noted  . Spondylosis, cervical, with myelopathy 09/22/2015  . Pain in joint, shoulder region, left 09/22/2015  . Hyperlipidemia 09/22/2015  . Neuropathy (HCC) 09/22/2015  . Vitamin D deficiency 09/22/2015    Kelsey Parsons, PTA 01/11/16 3:13 PM  [facility] [address] [place], [place], [address] Phone: [phone]   Fax:  [phone]  Name: Kerney B Emrich MRN: [id number] Date of Birth: 08/09/1945  PHYSICAL THERAPY DISCHARGE SUMMARY  Visits from Start of Care: 10.  Current functional level related to goals / functional outcomes: See above.   Remaining deficits: All goals met.   Education / Equipment: HEP. Plan: Patient agrees to discharge.  Patient goals were met. Patient is being discharged due to meeting the stated rehab goals.  ?????   Chad Applegate MPT         

## 2016-01-11 DIAGNOSIS — M25571 Pain in right ankle and joints of right foot: Secondary | ICD-10-CM | POA: Diagnosis not present

## 2016-01-25 ENCOUNTER — Other Ambulatory Visit: Payer: Self-pay | Admitting: Family Medicine

## 2016-02-24 DIAGNOSIS — M7661 Achilles tendinitis, right leg: Secondary | ICD-10-CM | POA: Diagnosis not present

## 2016-03-02 ENCOUNTER — Other Ambulatory Visit: Payer: Self-pay | Admitting: Family Medicine

## 2016-03-02 NOTE — Telephone Encounter (Signed)
Last seen SEPT - 2017 Stacks

## 2016-04-30 ENCOUNTER — Other Ambulatory Visit: Payer: Self-pay | Admitting: Family Medicine

## 2016-06-17 ENCOUNTER — Ambulatory Visit (INDEPENDENT_AMBULATORY_CARE_PROVIDER_SITE_OTHER): Payer: PPO | Admitting: *Deleted

## 2016-06-17 VITALS — BP 114/73 | HR 77 | Ht 71.5 in | Wt 238.0 lb

## 2016-06-17 DIAGNOSIS — E785 Hyperlipidemia, unspecified: Secondary | ICD-10-CM

## 2016-06-17 DIAGNOSIS — E559 Vitamin D deficiency, unspecified: Secondary | ICD-10-CM | POA: Diagnosis not present

## 2016-06-17 DIAGNOSIS — Z0000 Encounter for general adult medical examination without abnormal findings: Secondary | ICD-10-CM

## 2016-06-17 DIAGNOSIS — D696 Thrombocytopenia, unspecified: Secondary | ICD-10-CM | POA: Diagnosis not present

## 2016-06-17 DIAGNOSIS — Z7689 Persons encountering health services in other specified circumstances: Secondary | ICD-10-CM | POA: Diagnosis not present

## 2016-06-17 DIAGNOSIS — Z1211 Encounter for screening for malignant neoplasm of colon: Secondary | ICD-10-CM

## 2016-06-17 DIAGNOSIS — Z1159 Encounter for screening for other viral diseases: Secondary | ICD-10-CM | POA: Diagnosis not present

## 2016-06-17 MED ORDER — ATORVASTATIN CALCIUM 40 MG PO TABS
40.0000 mg | ORAL_TABLET | Freq: Every day | ORAL | 0 refills | Status: DC
Start: 2016-06-17 — End: 2016-06-22

## 2016-06-17 NOTE — Patient Instructions (Signed)
  Mr. Delton Seeelson , Thank you for taking time to come for your Medicare Wellness Visit. I appreciate your ongoing commitment to your health goals. Please review the following plan we discussed and let me know if I can assist you in the future.   These are the goals we discussed: Goals    . Exercise 150 minutes per week (moderate activity)       This is a list of the screening recommended for you and due dates:  Health Maintenance  Topic Date Due  .  Hepatitis C: One time screening is recommended by Center for Disease Control  (CDC) for  adults born from [birth year] through 1965.   29-Mar-1945  . Tetanus Vaccine  02/28/1964  . Colon Cancer Screening  02/28/1995  . Pneumonia vaccines (1 of 2 - PCV13) 02/27/2010  . Flu Shot  08/03/2016   Keep follow up appointment with Dr Darlyn ReadStacks on 06/22/16.

## 2016-06-18 LAB — CMP14+EGFR
ALBUMIN: 4.4 g/dL (ref 3.5–4.8)
ALK PHOS: 67 IU/L (ref 39–117)
ALT: 25 IU/L (ref 0–44)
AST: 23 IU/L (ref 0–40)
Albumin/Globulin Ratio: 2.3 — ABNORMAL HIGH (ref 1.2–2.2)
BILIRUBIN TOTAL: 1.1 mg/dL (ref 0.0–1.2)
BUN / CREAT RATIO: 18 (ref 10–24)
BUN: 21 mg/dL (ref 8–27)
CHLORIDE: 103 mmol/L (ref 96–106)
CO2: 25 mmol/L (ref 20–29)
CREATININE: 1.17 mg/dL (ref 0.76–1.27)
Calcium: 9.2 mg/dL (ref 8.6–10.2)
GFR calc Af Amer: 72 mL/min/{1.73_m2} (ref >59)
GFR calc non Af Amer: 62 mL/min/{1.73_m2} (ref >59)
GLOBULIN, TOTAL: 1.9 g/dL (ref 1.5–4.5)
Glucose: 104 mg/dL — ABNORMAL HIGH (ref 65–99)
Potassium: 4.4 mmol/L (ref 3.5–5.2)
SODIUM: 142 mmol/L (ref 134–144)
Total Protein: 6.3 g/dL (ref 6.0–8.5)

## 2016-06-18 LAB — CBC WITH DIFFERENTIAL/PLATELET
BASOS ABS: 0 10*3/uL (ref 0.0–0.2)
Basos: 0 %
EOS (ABSOLUTE): 0.1 10*3/uL (ref 0.0–0.4)
Eos: 2 %
HEMOGLOBIN: 14.2 g/dL (ref 13.0–17.7)
Hematocrit: 41.5 % (ref 37.5–51.0)
Immature Grans (Abs): 0 10*3/uL (ref 0.0–0.1)
Immature Granulocytes: 1 %
LYMPHS ABS: 1.2 10*3/uL (ref 0.7–3.1)
Lymphs: 19 %
MCH: 30.5 pg (ref 26.6–33.0)
MCHC: 34.2 g/dL (ref 31.5–35.7)
MCV: 89 fL (ref 79–97)
MONOCYTES: 8 %
Monocytes Absolute: 0.5 10*3/uL (ref 0.1–0.9)
Neutrophils Absolute: 4.4 10*3/uL (ref 1.4–7.0)
Neutrophils: 70 %
PLATELETS: 129 10*3/uL — AB (ref 150–379)
RBC: 4.65 x10E6/uL (ref 4.14–5.80)
RDW: 13.4 % (ref 12.3–15.4)
WBC: 6.2 10*3/uL (ref 3.4–10.8)

## 2016-06-18 LAB — LIPID PANEL
CHOLESTEROL TOTAL: 208 mg/dL — AB (ref 100–199)
Chol/HDL Ratio: 5.2 ratio — ABNORMAL HIGH (ref 0.0–5.0)
HDL: 40 mg/dL (ref >39)
LDL Calculated: 136 mg/dL — ABNORMAL HIGH (ref 0–99)
Triglycerides: 160 mg/dL — ABNORMAL HIGH (ref 0–149)
VLDL Cholesterol Cal: 32 mg/dL (ref 5–40)

## 2016-06-18 LAB — HEPATITIS C ANTIBODY

## 2016-06-18 LAB — VITAMIN D 25 HYDROXY (VIT D DEFICIENCY, FRACTURES): Vit D, 25-Hydroxy: 37.5 ng/mL (ref 30.0–100.0)

## 2016-06-20 ENCOUNTER — Other Ambulatory Visit: Payer: Self-pay | Admitting: Family Medicine

## 2016-06-20 NOTE — Progress Notes (Signed)
Subjective:   Fernando Pratt is a 71 y.o. male who presents for an Initial Medicare Annual Wellness Visit. 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 He lives in a house and has a roommate and a dog. He has three adult sons.   Review of Systems  Reports that his health is better than last year.  Cardiac Risk Factors include: advanced age (>7655men, 74>65 women);dyslipidemia;male gender;obesity (BMI >30kg/m2)  Musculoskeletal: bone spur Right heel causes some pain. He reports a crushed heel injury 4 years ago.   Other systems negative.  Objective:    Today's Vitals   06/17/16 1138  BP: 114/73  Pulse: 77  Weight: 238 lb (108 kg)  Height: 5' 11.5" (1.816 m)   Body mass index is 32.73 kg/m.  Current Medications (verified) Outpatient Encounter Prescriptions as of 06/17/2016  Medication Sig  . Cholecalciferol (VITAMIN D3) 2000 units TABS Take by mouth.  . Nutritional Supplements (ARTHRO-COMPLEX PO) Take by mouth.  . pyridOXINE (VITAMIN B-6) 100 MG tablet Take 100 mg by mouth daily.  Marland Kitchen. atorvastatin (LIPITOR) 40 MG tablet Take 1 tablet (40 mg total) by mouth daily.  . celecoxib (CELEBREX) 400 MG capsule TAKE ONE CAPSULE BY MOUTH ONCE DAILY WITH FOOD (Patient not taking: Reported on 06/17/2016)  . gabapentin (NEURONTIN) 300 MG capsule TAKE TWO CAPSULES BY MOUTH TWICE DAILY (Patient not taking: Reported on 06/17/2016)  . sildenafil (REVATIO) 20 MG tablet Take 1 tablet (20 mg total) by mouth daily as needed (2-5 as needed). (Patient not taking: Reported on 06/17/2016)  . [DISCONTINUED] atorvastatin (LIPITOR) 40 MG tablet Take 1 tablet (40 mg total) by mouth daily. (Patient not taking: Reported on 06/17/2016)   No facility-administered encounter medications on file as of 06/17/2016.     Allergies (verified) Patient has no known allergies.   History: Past Medical History:  Diagnosis Date  . Arthritis   . Hyperlipidemia    Past Surgical History:  Procedure  Laterality Date  . NECK SURGERY  1991   Family History  Problem Relation Age of Onset  . Cancer Mother        colon and breast  . Healthy Son   . Healthy Son   . Healthy Son    Social History   Occupational History  . Not on file.   Social History Main Topics  . Smoking status: Never Smoker  . Smokeless tobacco: Never Used  . Alcohol use No  . Drug use: No  . Sexual activity: Not Currently   Tobacco Counseling No tobacco use  Activities of Daily Living In your present state of health, do you have any difficulty performing the following activities: 06/17/2016  Hearing? N  Vision? N  Difficulty concentrating or making decisions? N  Walking or climbing stairs? N  Dressing or bathing? N  Doing errands, shopping? N  Preparing Food and eating ? N  Using the Toilet? N  In the past six months, have you accidently leaked urine? N  Do you have problems with loss of bowel control? N  Managing your Medications? N  Managing your Finances? N  Housekeeping or managing your Housekeeping? N  Some recent data might be hidden    Immunizations and Health Maintenance Immunization History  Administered Date(s) Administered  . Influenza,inj,Quad PF,36+ Mos 11/03/2014, 09/22/2015   Health Maintenance Due  Topic Date Due  . TETANUS/TDAP  02/28/1964  . COLONOSCOPY  02/28/1995  . PNA vac Low Risk Adult (1 of 2 -  PCV13) 02/27/2010    Patient Care Team: Mechele Claude, MD as PCP - General (Family Medicine) Michaelle Copas, MD as Referring Physician (Optometry)  No hospitalizations, ER visits, or surgeries this past year.  Assessment:   This is a routine wellness examination for Fernando Pratt.   Hearing/Vision screen No hearing or vision deficits noted during visit. Last eye exam was in 2017.  Dietary issues and exercise activities discussed: Current Exercise Habits: The patient has a physically strenous job, but has no regular exercise apart from work., Exercise limited by: None  identified   Diet: Eats 3 meals a day. Eats a mixture of homecooked meals and eating out.   Goals    . Exercise 150 minutes per week (moderate activity)      Depression Screen PHQ 2/9 Scores 06/17/2016 09/22/2015 12/15/2014 11/03/2014  PHQ - 2 Score 0 0 2 0  PHQ- 9 Score - - 2 -    Fall Risk Fall Risk  06/17/2016 09/22/2015 12/15/2014 11/03/2014 10/03/2014  Falls in the past year? No No No No No    Cognitive Function: MMSE - Mini Mental State Exam 06/17/2016  Orientation to time 5  Orientation to Place 5  Registration 3  Attention/ Calculation 5  Recall 2  Language- name 2 objects 2  Language- repeat 1  Language- follow 3 step command 3  Language- read & follow direction 1  Write a sentence 1  Copy design 1  Total score 29  Normal Exam      Screening Tests Health Maintenance  Topic Date Due  . TETANUS/TDAP  02/28/1964  . COLONOSCOPY  02/28/1995  . PNA vac Low Risk Adult (1 of 2 - PCV13) 02/27/2010  . INFLUENZA VACCINE  08/03/2016  . Hepatitis C Screening  Completed  Vaccines declined today  Advanced Directives: Explained and offered, but patient refused.     Plan:  Keep appt with Dr Darlyn Read on 6/20 Due for a colonoscopy. Prefers fall of 2018. Order placed. Stay active. Perform at least 150 min of moderate activity weekly. Hep C Screening done per CDC recommendations Labs drawn for upcoming appt with Dr Darlyn Read  I have personally reviewed and noted the following in the patient's chart:   . Medical and social history . Use of alcohol, tobacco or illicit drugs  . Current medications and supplements . Functional ability and status . Nutritional status . Physical activity . Advanced directives . List of other physicians . Hospitalizations, surgeries, and ER visits in previous 12 months . Vitals . Screenings to include cognitive, depression, and falls . Referrals and appointments  In addition, I have reviewed and discussed with patient certain preventive  protocols, quality metrics, and best practice recommendations. A written personalized care plan for preventive services as well as general preventive health recommendations were provided to patient.    Demetrios Loll, RN  06/20/2016

## 2016-06-22 ENCOUNTER — Encounter: Payer: Self-pay | Admitting: Family Medicine

## 2016-06-22 ENCOUNTER — Ambulatory Visit (INDEPENDENT_AMBULATORY_CARE_PROVIDER_SITE_OTHER): Payer: PPO | Admitting: Family Medicine

## 2016-06-22 VITALS — BP 127/69 | HR 71 | Temp 97.0°F | Ht 71.5 in | Wt 242.0 lb

## 2016-06-22 DIAGNOSIS — M4712 Other spondylosis with myelopathy, cervical region: Secondary | ICD-10-CM

## 2016-06-22 DIAGNOSIS — E785 Hyperlipidemia, unspecified: Secondary | ICD-10-CM

## 2016-06-22 DIAGNOSIS — E559 Vitamin D deficiency, unspecified: Secondary | ICD-10-CM | POA: Diagnosis not present

## 2016-06-22 MED ORDER — ATORVASTATIN CALCIUM 80 MG PO TABS: 80.0000 mg | ORAL_TABLET | Freq: Every day | ORAL | 3 refills | Status: DC

## 2016-06-22 MED ORDER — CELECOXIB 400 MG PO CAPS: ORAL_CAPSULE | ORAL | 1 refills | Status: DC

## 2016-06-22 MED ORDER — SILDENAFIL CITRATE 20 MG PO TABS: 20.0000 mg | ORAL_TABLET | Freq: Every day | ORAL | 5 refills | Status: DC | PRN

## 2016-06-22 NOTE — Progress Notes (Signed)
Subjective:  Patient ID: Fernando Pratt, male    DOB: [birth year]  Age: 71 y.o. MRN: [id number]  CC: Follow-up (pt here today for routine follow up of his chronic medical conditions and also c/o right foot swelling which he thinks is due to a bone spur.)   HPI Fernando Pratt presents for Patient was told that he have to live with it because he is not a candidate for repair. The Achilles tendon would have to be separated from the calcaneus for repair and that would not heal properly. Patient does have moderate pain in spite of current medication. However, his neck is doing much better. He states it's not bothering at this time.  He continues to take vitamin D over-the-counter is due for a recheck of that condition. He wants to be sure that his bones stay healthy.  Patient in for follow-up of elevated cholesterol. Doing well without complaints on current medication. Denies side effects of statin including myalgia and arthralgia and nausea. Also in today for liver function testing. Currently no chest pain, shortness of breath or other cardiovascular related symptoms noted.  He never tried the sildenafil previously but would like to have a new prescription. He is still interested in trying a medication. He still has problems with erections. He is not sexually active often but would like to have the opportunity when available. History Fernando Pratt has a past medical history of Arthritis and Hyperlipidemia.   He has a past surgical history that includes Neck surgery (1991).   His family history includes Cancer in his mother; Healthy in his son, son, and son.He reports that he has never smoked. He has never used smokeless tobacco. He reports that he does not drink alcohol or use drugs.    ROS Review of Systems  Constitutional: Negative for chills, diaphoresis, fever and unexpected weight change.  HENT: Negative for congestion, hearing loss, rhinorrhea and sore throat.   Eyes: Negative for visual  disturbance.  Respiratory: Negative for cough and shortness of breath.   Cardiovascular: Negative for chest pain.  Gastrointestinal: Negative for abdominal pain, constipation and diarrhea.  Genitourinary: Negative for dysuria and flank pain.  Musculoskeletal: Negative for arthralgias and joint swelling.  Skin: Negative for rash.  Neurological: Negative for dizziness and headaches.  Psychiatric/Behavioral: Negative for dysphoric mood and sleep disturbance.    Objective:  BP 127/69   Pulse 71   Temp 97 F (36.1 C) (Oral)   Ht 5' 11.5" (1.816 m)   Wt 242 lb (109.8 kg)   BMI 33.28 kg/m   BP Readings from Last 3 Encounters:  06/22/16 127/69  06/17/16 114/73  09/22/15 123/70    Wt Readings from Last 3 Encounters:  06/22/16 242 lb (109.8 kg)  06/17/16 238 lb (108 kg)  09/22/15 254 lb 6 oz (115.4 kg)     Physical Exam  Constitutional: He is oriented to person, place, and time. He appears well-developed and well-nourished. No distress.  HENT:  Head: Normocephalic and atraumatic.  Right Ear: External ear normal.  Left Ear: External ear normal.  Nose: Nose normal.  Mouth/Throat: Oropharynx is clear and moist.  Eyes: Conjunctivae and EOM are normal. Pupils are equal, round, and reactive to light.  Neck: Normal range of motion. Neck supple. No thyromegaly present.  Cardiovascular: Normal rate, regular rhythm and normal heart sounds.   No murmur heard. Pulmonary/Chest: Effort normal and breath sounds normal. No respiratory distress. He has no wheezes. He has no rales.  Abdominal: Soft. Bowel sounds  are normal. He exhibits no distension. There is no tenderness.  Lymphadenopathy:    He has no cervical adenopathy.  Neurological: He is alert and oriented to person, place, and time. He has normal reflexes.  Skin: Skin is warm and dry.  Psychiatric: He has a normal mood and affect. His behavior is normal. Judgment and thought content normal.      Assessment & Plan:   Gerlene BurdockRichard  was seen today for follow-up.  Diagnoses and all orders for this visit:  Spondylosis, cervical, with myelopathy  Vitamin D deficiency  Hyperlipidemia, unspecified hyperlipidemia type  Other orders -     atorvastatin (LIPITOR) 80 MG tablet; Take 1 tablet (80 mg total) by mouth daily. -     celecoxib (CELEBREX) 400 MG capsule; TAKE 1 CAPSULE BY MOUTH ONCE DAILY WITH FOOD -     sildenafil (REVATIO) 20 MG tablet; Take 1 tablet (20 mg total) by mouth daily as needed (2-5 as needed).       I have discontinued Mr. Sanor's gabapentin and atorvastatin. I am also having him start on atorvastatin. Additionally, I am having him maintain his pyridOXINE, Vitamin D3, Nutritional Supplements (ARTHRO-COMPLEX PO), celecoxib, and sildenafil.  Allergies as of 06/22/2016   No Known Allergies     Medication List       Accurate as of 06/22/16  5:27 PM. Always use your most recent med list.          ARTHRO-COMPLEX PO Take by mouth.   atorvastatin 80 MG tablet Commonly known as:  LIPITOR Take 1 tablet (80 mg total) by mouth daily.   celecoxib 400 MG capsule Commonly known as:  CELEBREX TAKE 1 CAPSULE BY MOUTH ONCE DAILY WITH FOOD   pyridOXINE 100 MG tablet Commonly known as:  VITAMIN B-6 Take 100 mg by mouth daily.   sildenafil 20 MG tablet Commonly known as:  REVATIO Take 1 tablet (20 mg total) by mouth daily as needed (2-5 as needed).   Vitamin D3 2000 units Tabs Take by mouth.        Follow-up: No Follow-up on file.  Mechele ClaudeWarren Brennon Otterness, M.D.

## 2016-06-24 LAB — PSA, TOTAL AND FREE
PSA, Free Pct: 20 %
PSA, Free: 0.08 ng/mL
Prostate Specific Ag, Serum: 0.4 ng/mL (ref 0.0–4.0)

## 2016-06-24 LAB — SPECIMEN STATUS REPORT

## 2016-09-15 ENCOUNTER — Encounter: Payer: Self-pay | Admitting: Family Medicine

## 2017-03-27 ENCOUNTER — Ambulatory Visit (INDEPENDENT_AMBULATORY_CARE_PROVIDER_SITE_OTHER): Payer: PPO | Admitting: Family Medicine

## 2017-03-27 ENCOUNTER — Encounter: Payer: Self-pay | Admitting: Family Medicine

## 2017-03-27 VITALS — BP 128/76 | HR 91 | Temp 96.9°F | Ht 71.5 in | Wt 238.0 lb

## 2017-03-27 DIAGNOSIS — M62561 Muscle wasting and atrophy, not elsewhere classified, right lower leg: Secondary | ICD-10-CM

## 2017-03-27 DIAGNOSIS — E785 Hyperlipidemia, unspecified: Secondary | ICD-10-CM

## 2017-03-27 DIAGNOSIS — M4712 Other spondylosis with myelopathy, cervical region: Secondary | ICD-10-CM

## 2017-03-27 MED ORDER — ATORVASTATIN CALCIUM 80 MG PO TABS: 80.0000 mg | ORAL_TABLET | Freq: Every day | ORAL | 1 refills | Status: DC

## 2017-03-27 MED ORDER — SILDENAFIL CITRATE 20 MG PO TABS: 20.0000 mg | ORAL_TABLET | Freq: Every day | ORAL | 5 refills | Status: DC | PRN

## 2017-03-27 MED ORDER — CELECOXIB 400 MG PO CAPS: ORAL_CAPSULE | ORAL | 1 refills | Status: DC

## 2017-03-27 NOTE — Progress Notes (Signed)
Subjective:  Patient ID: Fernando Pratt, male    DOB: 02/18/1945  Age: 72 y.o. MRN: [id number]  CC: No chief complaint on file.   HPI Fernando Pratt presents for recheck of his arthritis and cholesterol.  He discontinued all of his medicines around Christmas time because he got busy and ran out of medicine and did not have time to come in to be seen.  Of note is that he has had no change in his arthritis since stopping the cholesterol medicine.  He continues to have pain in the right leg based on spur that he has had in the Achilles.  Additionally he suffers from pain in the base of the neck from a history of arthritis of the neck as well.  Pain is moderately severe but currently not debilitating.  He quit taking the Celebrex for that as well.  Patient also suffers from erectile dysfunction he never filled the prescription/0/sildenafil that was prescribed last summer.  Depression screen [facility] 2/9 03/27/2017 06/22/2016 06/17/2016  Decreased Interest 0 0 0  Down, Depressed, Hopeless 0 0 0  PHQ - 2 Score 0 0 0  Altered sleeping - - -  Tired, decreased energy - - -  Change in appetite - - -  Feeling bad or failure about yourself  - - -  Trouble concentrating - - -  Moving slowly or fidgety/restless - - -  Suicidal thoughts - - -  PHQ-9 Score - - -    History Kincade has a past medical history of Arthritis and Hyperlipidemia.   He has a past surgical history that includes Neck surgery (1991).   His family history includes Cancer in his mother; Healthy in his son, son, and son.He reports that he has never smoked. He has never used smokeless tobacco. He reports that he does not drink alcohol or use drugs.    ROS Review of Systems  Constitutional: Negative for chills, diaphoresis, fever and unexpected weight change.  HENT: Negative for congestion, hearing loss, rhinorrhea and sore throat.   Eyes: Negative for visual disturbance.  Respiratory: Negative for cough and shortness of breath.    Cardiovascular: Negative for chest pain.  Gastrointestinal: Negative for abdominal pain, constipation and diarrhea.  Genitourinary: Negative for dysuria and flank pain.  Musculoskeletal: Positive for arthralgias and neck pain. Negative for joint swelling and myalgias.  Skin: Negative for rash.  Neurological: Negative for dizziness, weakness and headaches.  Psychiatric/Behavioral: Negative for dysphoric mood and sleep disturbance.    Objective:  BP 128/76   Pulse 91   Temp (!) 96.9 F (36.1 C) (Oral)   Ht 5' 11.5" (1.816 m)   Wt 238 lb (108 kg)   BMI 32.73 kg/m   BP Readings from Last 3 Encounters:  03/27/17 128/76  06/22/16 127/69  06/17/16 114/73    Wt Readings from Last 3 Encounters:  03/27/17 238 lb (108 kg)  06/22/16 242 lb (109.8 kg)  06/17/16 238 lb (108 kg)     Physical Exam  Constitutional: He is oriented to person, place, and time. He appears well-developed and well-nourished. No distress.  HENT:  Head: Normocephalic and atraumatic.  Right Ear: External ear normal.  Left Ear: External ear normal.  Nose: Nose normal.  Mouth/Throat: Oropharynx is clear and moist.  Eyes: Pupils are equal, round, and reactive to light. Conjunctivae and EOM are normal.  Neck: Normal range of motion. Neck supple. No thyromegaly present.  Cardiovascular: Normal rate, regular rhythm and normal heart sounds.  No  murmur heard. Pulmonary/Chest: Effort normal and breath sounds normal. No respiratory distress. He has no wheezes. He has no rales.  Abdominal: Soft. Bowel sounds are normal. He exhibits no distension. There is no tenderness.  Lymphadenopathy:    He has no cervical adenopathy.  Neurological: He is alert and oriented to person, place, and time. He has normal reflexes.  Skin: Skin is warm and dry.  Psychiatric: He has a normal mood and affect. His behavior is normal. Judgment and thought content normal.      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 -     celecoxib (CELEBREX) 400 MG capsule; TAKE 1 CAPSULE BY MOUTH ONCE DAILY WITH FOOD -     Discontinue: sildenafil (REVATIO) 20 MG tablet; Take 1 tablet (20 mg total) by mouth daily as needed (2-5 as needed). -     sildenafil (REVATIO) 20 MG tablet; Take 1 tablet (20 mg total) by mouth daily as needed (2-5 as needed).       I am having Fernando Pratt maintain his pyridOXINE, Vitamin D3, Nutritional Supplements (ARTHRO-COMPLEX PO), atorvastatin, celecoxib, and sildenafil.  Allergies as of 03/27/2017   No Known Allergies     Medication List        Accurate as of 03/27/17 11:59 PM. Always use your most recent med list.          ARTHRO-COMPLEX PO Take by mouth.   atorvastatin 80 MG tablet Commonly known as:  LIPITOR Take 1 tablet (80 mg total) by mouth daily.   celecoxib 400 MG capsule Commonly known as:  CELEBREX TAKE 1 CAPSULE BY MOUTH ONCE DAILY WITH FOOD   pyridOXINE 100 MG tablet Commonly known as:  VITAMIN B-6 Take 100 mg by mouth daily.   sildenafil 20 MG tablet Commonly known as:  REVATIO Take 1 tablet (20 mg total) by mouth daily as needed (2-5 as needed).   Vitamin D3 2000 units Tabs Take by mouth.        Follow-up: Return in about 6 months (around 09/27/2017).  Claretta Fraise, M.D.

## 2017-03-28 ENCOUNTER — Encounter: Payer: Self-pay | Admitting: Family Medicine

## 2017-03-28 LAB — CMP14+EGFR
ALBUMIN: 4.2 g/dL (ref 3.5–4.8)
ALT: 23 IU/L (ref 0–44)
AST: 22 IU/L (ref 0–40)
Albumin/Globulin Ratio: 1.9 (ref 1.2–2.2)
Alkaline Phosphatase: 68 IU/L (ref 39–117)
BUN / CREAT RATIO: 17 (ref 10–24)
BUN: 22 mg/dL (ref 8–27)
Bilirubin Total: 1 mg/dL (ref 0.0–1.2)
CALCIUM: 8.9 mg/dL (ref 8.6–10.2)
CO2: 25 mmol/L (ref 20–29)
Chloride: 104 mmol/L (ref 96–106)
Creatinine, Ser: 1.26 mg/dL (ref 0.76–1.27)
GFR, EST AFRICAN AMERICAN: 65 mL/min/{1.73_m2} (ref >59)
GFR, EST NON AFRICAN AMERICAN: 57 mL/min/{1.73_m2} — AB (ref >59)
GLOBULIN, TOTAL: 2.2 g/dL (ref 1.5–4.5)
Glucose: 92 mg/dL (ref 65–99)
Potassium: 4.5 mmol/L (ref 3.5–5.2)
SODIUM: 144 mmol/L (ref 134–144)
TOTAL PROTEIN: 6.4 g/dL (ref 6.0–8.5)

## 2017-03-28 LAB — CBC WITH DIFFERENTIAL/PLATELET
BASOS: 1 %
Basophils Absolute: 0 10*3/uL (ref 0.0–0.2)
EOS (ABSOLUTE): 0.2 10*3/uL (ref 0.0–0.4)
EOS: 2 %
HEMOGLOBIN: 14.3 g/dL (ref 13.0–17.7)
Hematocrit: 43.6 % (ref 37.5–51.0)
Immature Grans (Abs): 0 10*3/uL (ref 0.0–0.1)
Immature Granulocytes: 0 %
LYMPHS ABS: 1.6 10*3/uL (ref 0.7–3.1)
Lymphs: 23 %
MCH: 29.7 pg (ref 26.6–33.0)
MCHC: 32.8 g/dL (ref 31.5–35.7)
MCV: 91 fL (ref 79–97)
MONOCYTES: 9 %
MONOS ABS: 0.6 10*3/uL (ref 0.1–0.9)
Neutrophils Absolute: 4.5 10*3/uL (ref 1.4–7.0)
Neutrophils: 65 %
Platelets: 140 10*3/uL — ABNORMAL LOW (ref 150–379)
RBC: 4.82 x10E6/uL (ref 4.14–5.80)
RDW: 12.8 % (ref 12.3–15.4)
WBC: 7 10*3/uL (ref 3.4–10.8)

## 2017-08-10 ENCOUNTER — Ambulatory Visit (INDEPENDENT_AMBULATORY_CARE_PROVIDER_SITE_OTHER): Payer: PPO

## 2017-08-10 VITALS — BP 137/79 | HR 67 | Temp 97.5°F | Ht 71.0 in | Wt 240.0 lb

## 2017-08-10 DIAGNOSIS — Z0000 Encounter for general adult medical examination without abnormal findings: Secondary | ICD-10-CM

## 2017-08-10 NOTE — Patient Instructions (Signed)
  Mr. Fernando Pratt , Thank you for taking time to come for your Medicare Wellness Visit. I appreciate your ongoing commitment to your health goals. Please review the following plan we discussed and let me know if I can assist you in the future.   These are the goals we discussed: Goals    . DIET - EAT MORE FRUITS AND VEGETABLES    . Exercise 150 minutes per week (moderate activity)       This is a list of the screening recommended for you and due dates:  Health Maintenance  Topic Date Due  . Tetanus Vaccine  02/28/1964  . Colon Cancer Screening  02/28/1995  . Pneumonia vaccines (1 of 2 - PCV13) 02/27/2010  . Flu Shot  11/27/2017*  .  Hepatitis C: One time screening is recommended by Center for Disease Control  (CDC) for  adults born from [birth year] through 1965.   Completed  *Topic was postponed. The date shown is not the original due date.

## 2017-08-10 NOTE — Progress Notes (Addendum)
Subjective:   Fernando Pratt is a 72 y.o. male who presents for an Initial Medicare Annual Wellness Visit. He currently resides in [place] and lives alone. He has been divorced 3 times and he has 3 sons. His youngest son is a Librarian, academicdoctor, another son lives near [place] and is a Animatorbuilding contractor, and his other son [place] in [place] and is a Biochemist, clinicalMicrosoft troubleshooter. 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 He states that he thinks about retiring but doesn't see it happening. He has 6 dogs at home and enjoys watching football and [employer]. His favorite football team is the [employer]. He currently does not attend church and is not involved in any community organizations or clubs. He does have stairs in his home. He has a basement and a second story. He states that he has no trouble walking up and down the stairs. He appears very healthy and active. He doesn't have a regular exercise routine but stays busy with work and that keeps him moving. He currently has a living will and I requested a copy for the chart.   Review of Systems   Cardiac Risk Factors include: advanced age (>7855men, 47>65 women);dyslipidemia;male gender;obesity (BMI >30kg/m2);smoking/ tobacco exposure    Objective:    Today's Vitals   08/10/17 0823 08/10/17 0824  BP: (!) 152/80 137/79  Pulse: 67   Temp: (!) 97.5 F (36.4 C)   TempSrc: Oral   Weight: 240 lb (108.9 kg)   Height: 5\' 11"  (1.803 m)    Body mass index is 33.47 kg/m.  Advanced Directives 08/10/2017 06/17/2016 12/07/2015 01/13/2015 11/10/2014  Does Patient Have a Medical Advance Directive? Yes No No No No  Type of Advance Directive Living will - - - -  Does patient want to make changes to medical advance directive? No - Patient declined - - - -  Would patient like information on creating a medical advance directive? - No - Patient declined - - No - patient declined information    Current Medications (verified) Outpatient Encounter Medications  as of 08/10/2017  Medication Sig  . atorvastatin (LIPITOR) 80 MG tablet Take 1 tablet (80 mg total) by mouth daily.  . celecoxib (CELEBREX) 400 MG capsule TAKE 1 CAPSULE BY MOUTH ONCE DAILY WITH FOOD  . Cholecalciferol (VITAMIN D3) 2000 units TABS Take by mouth.  . Nutritional Supplements (ARTHRO-COMPLEX PO) Take by mouth.  . pyridOXINE (VITAMIN B-6) 100 MG tablet Take 100 mg by mouth daily.  . sildenafil (REVATIO) 20 MG tablet Take 1 tablet (20 mg total) by mouth daily as needed (2-5 as needed).   No facility-administered encounter medications on file as of 08/10/2017.     Allergies (verified) Patient has no known allergies.   History: Past Medical History:  Diagnosis Date  . Arthritis   . Hyperlipidemia    Past Surgical History:  Procedure Laterality Date  . NECK SURGERY  1991   Family History  Problem Relation Age of Onset  . Cancer Mother        colon and breast  . Arthritis Mother   . Healthy Son   . Healthy Son   . Healthy Son    Social History   Socioeconomic History  . Marital status: Divorced    Spouse name: Not on file  . Number of children: Not on file  . Years of education: Not on file  . Highest education level: 12th grade  Occupational History  . Occupation: Personnel officerlectrician  Comment: 52 years  Social Needs  . Financial resource strain: Not hard at all  . Food insecurity:    Worry: Never true    Inability: Never true  . Transportation needs:    Medical: No    Non-medical: No  Tobacco Use  . Smoking status: Light Tobacco Smoker    Types: Cigars  . Smokeless tobacco: Never Used  Substance and Sexual Activity  . Alcohol use: No  . Drug use: No  . Sexual activity: Not Currently  Lifestyle  . Physical activity:    Days per week: 0 days    Minutes per session: 0 min  . Stress: Not at all  Relationships  . Social connections:    Talks on phone: More than three times a week    Gets together: More than three times a week    Attends religious  service: Never    Active member of club or organization: No    Attends meetings of clubs or organizations: Never    Relationship status: Divorced  Other Topics Concern  . Not on file  Social History Narrative  . Not on file   Tobacco Counseling Patient states that he smokes cigars occassionally  Clinical Intake:  Pre-visit preparation completed: No  Pain : No/denies pain     BMI - recorded: 32.74 Nutritional Status: BMI > 30  Obese Nutritional Risks: None Diabetes: No  How often do you need to have someone help you when you read instructions, pamphlets, or other written materials from your doctor or pharmacy?: 1 - Never What is the last grade level you completed in school?: 12th  Interpreter Needed?: No  Information entered by :: Mckinley Jewel LPN  Activities of Daily Living In your present state of health, do you have any difficulty performing the following activities: 08/10/2017  Hearing? Y  Comment Some difficulty but not bad. Does not have hearing aids  Vision? Y  Comment Wears glasses all the time  Difficulty concentrating or making decisions? N  Walking or climbing stairs? N  Dressing or bathing? N  Doing errands, shopping? N  Preparing Food and eating ? N  Using the Toilet? N  In the past six months, have you accidently leaked urine? N  Do you have problems with loss of bowel control? N  Managing your Medications? N  Managing your Finances? N  Housekeeping or managing your Housekeeping? N  Some recent data might be hidden     Immunizations and Health Maintenance Immunization History  Administered Date(s) Administered  . Influenza,inj,Quad PF,6+ Mos 11/03/2014, 09/22/2015   Health Maintenance Due  Topic Date Due  . TETANUS/TDAP  02/28/1964  . COLONOSCOPY  02/28/1995  . PNA vac Low Risk Adult (1 of 2 - PCV13) 02/27/2010   Advised patient that he needs a tetanus. He declined today. He states that he had a colonoscopy 10 years ago and it is time for him  to follow up but he hasn't done so. He also states that he has had both pneumonia shots at a [facility] but it is unclear which one. I do not see the information in the [facility].  Patient Care Team: Mechele Claude, MD as PCP - General (Family Medicine) Michaelle Copas, MD as Referring Physician (Optometry)  Indicate any recent Medical Services you may have received from other than [facility] providers in the past year (date may be approximate).    Assessment:   This is a routine wellness examination for Catarino.  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  Goals    . DIET - EAT MORE FRUITS AND VEGETABLES    . Exercise 150 minutes per week (moderate activity)      Depression Screen PHQ 2/9 Scores 08/10/2017 03/27/2017 06/22/2016 06/17/2016  PHQ - 2 Score 0 0 0 0  PHQ- 9 Score - - - -    Fall Risk Fall Risk  08/10/2017 03/27/2017 06/22/2016 06/17/2016 09/22/2015  Falls in the past year? No No No No No    Is the patient's home free of loose throw rugs in walkways, pet beds, electrical cords, etc?   yes      Grab bars in the bathroom? no      Handrails on the stairs?   yes      Adequate lighting?   yes    Cognitive Function: MMSE - Mini Mental State Exam 08/10/2017 06/17/2016  Orientation to time 5 5  Orientation to Place 5 5  Registration 3 3  Attention/ Calculation 5 5  Recall 3 2  Language- name 2 objects 2 2  Language- repeat 1 1  Language- follow 3 step command 3 3  Language- read & follow direction 1 1  Write a sentence 1 1  Copy design 1 1  Total score 30 29    Patient had no problem completing the MMSE    Screening Tests Health Maintenance  Topic Date Due  . TETANUS/TDAP  02/28/1964  . COLONOSCOPY   02/28/1995  . PNA vac Low Risk Adult (1 of 2 - PCV13) 02/27/2010  . INFLUENZA VACCINE  11/27/2017 (Originally 08/03/2017)  . Hepatitis C Screening  Completed   Patient notified that he is due for tetanus but declines today. Also states he is due for a repeat colonoscopy but has not followed up. He states that he has had both pneumonia shots [facility] but it is unclear which pharmacy this was done at the no record in the [facility].  Qualifies for Shingles Vaccine? Patient qualifies. Will discuss at next office visit  Cancer Screenings: Lung: Low Dose CT Chest recommended if Age 55-80 years, 30 pack-year currently smoking OR have quit w/in 15years. Patient does not qualify. Colorectal: Patient currently due for a follow up colonoscopy  Additional Screenings:  Hepatitis C Screening:  Completed 06/17/2016      Plan:     Will attempt to track down shot records and colonoscopy records.  I have personally reviewed and noted the following in the patient's chart:   . Medical and social history . Use of alcohol, tobacco or illicit drugs  . Current medications and supplements . Functional ability and status . Nutritional status . Physical activity . Advanced directives . List of other physicians . Hospitalizations, surgeries, and ER visits in previous 12 months . Vitals . Screenings to include cognitive, depression, and falls . Referrals and appointments  In addition, I have reviewed and discussed with patient certain preventive protocols, quality metrics, and best practice recommendations. A written personalized care plan for preventive services as well as general preventive health recommendations were provided to patient.     Cleda Daub, LPN   01/08/1094    I have reviewed and agree with the above AWV documentation.  Mechele Claude, M.D.

## 2017-09-27 ENCOUNTER — Ambulatory Visit (INDEPENDENT_AMBULATORY_CARE_PROVIDER_SITE_OTHER): Payer: PPO | Admitting: Family Medicine

## 2017-09-27 ENCOUNTER — Encounter: Payer: Self-pay | Admitting: Family Medicine

## 2017-09-27 VITALS — BP 135/74 | HR 69 | Temp 97.2°F | Ht 71.0 in | Wt 244.0 lb

## 2017-09-27 DIAGNOSIS — Z23 Encounter for immunization: Secondary | ICD-10-CM

## 2017-09-27 DIAGNOSIS — E785 Hyperlipidemia, unspecified: Secondary | ICD-10-CM | POA: Diagnosis not present

## 2017-09-27 DIAGNOSIS — Z125 Encounter for screening for malignant neoplasm of prostate: Secondary | ICD-10-CM | POA: Diagnosis not present

## 2017-09-27 DIAGNOSIS — E559 Vitamin D deficiency, unspecified: Secondary | ICD-10-CM

## 2017-09-27 DIAGNOSIS — M4712 Other spondylosis with myelopathy, cervical region: Secondary | ICD-10-CM | POA: Diagnosis not present

## 2017-09-27 DIAGNOSIS — Z1211 Encounter for screening for malignant neoplasm of colon: Secondary | ICD-10-CM | POA: Diagnosis not present

## 2017-09-27 MED ORDER — CELECOXIB 400 MG PO CAPS
ORAL_CAPSULE | ORAL | 1 refills | Status: DC
Start: 2017-09-27 — End: 2018-03-28

## 2017-09-27 MED ORDER — ATORVASTATIN CALCIUM 80 MG PO TABS: 80.0000 mg | ORAL_TABLET | Freq: Every day | ORAL | 1 refills | Status: DC

## 2017-09-27 MED ORDER — SILDENAFIL CITRATE 20 MG PO TABS: 20.0000 mg | ORAL_TABLET | Freq: Every day | ORAL | 5 refills | Status: DC | PRN

## 2017-09-27 NOTE — Patient Instructions (Signed)

## 2017-09-27 NOTE — Progress Notes (Signed)
Subjective:  Patient ID: Fernando Pratt, male    DOB: 1945/05/09  Age: 72 y.o. MRN: [id number]  CC: Medical Management of Chronic Issues   HPI TREMAINE EARWOOD presents for follow-up of elevated cholesterol. Doing well without complaints on current medication. Denies side effects of statin including myalgia and arthralgia and nausea. Also in today for liver function testing. Currently no chest pain, shortness of breath or other cardiovascular related symptoms noted.  Patient says cervical spondylosis pain is under good control with the Celebrex.  He does not have any side effects from that the noticeable specifically he denies heartburn indigestion dyspepsia.  No bowel changes. History Luian has a past medical history of Arthritis and Hyperlipidemia.   He has a past surgical history that includes Neck surgery (1991).   His family history includes Arthritis in his mother; Cancer in his mother; Healthy in his son, son, and son.He reports that he has been smoking cigars. He has never used smokeless tobacco. He reports that he does not drink alcohol or use drugs.  Current Outpatient Medications on File Prior to Visit  Medication Sig Dispense Refill  . Cholecalciferol (VITAMIN D3) 2000 units TABS Take by mouth.    . Nutritional Supplements (ARTHRO-COMPLEX PO) Take by mouth.    . pyridOXINE (VITAMIN B-6) 100 MG tablet Take 100 mg by mouth daily.     No current facility-administered medications on file prior to visit.     ROS Review of Systems  Constitutional: Negative.   HENT: Negative.   Eyes: Negative for visual disturbance.  Respiratory: Negative for cough and shortness of breath.   Cardiovascular: Negative for chest pain and leg swelling.  Gastrointestinal: Negative for abdominal pain, diarrhea, nausea and vomiting.  Genitourinary: Negative for difficulty urinating.  Musculoskeletal: Negative for arthralgias and myalgias.  Skin: Negative for rash.  Neurological: Negative for  headaches.  Psychiatric/Behavioral: Negative for sleep disturbance.    Objective:  BP 135/74   Pulse 69   Temp (!) 97.2 F (36.2 C) (Oral)   Ht 5' 11" (1.803 m)   Wt 244 lb (110.7 kg)   BMI 34.03 kg/m   BP Readings from Last 3 Encounters:  09/27/17 135/74  08/10/17 137/79  03/27/17 128/76    Wt Readings from Last 3 Encounters:  09/27/17 244 lb (110.7 kg)  08/10/17 240 lb (108.9 kg)  03/27/17 238 lb (108 kg)     Physical Exam  Constitutional: He is oriented to person, place, and time. He appears well-developed and well-nourished. No distress.  HENT:  Head: Normocephalic and atraumatic.  Right Ear: External ear normal.  Left Ear: External ear normal.  Nose: Nose normal.  Mouth/Throat: Oropharynx is clear and moist.  Eyes: Pupils are equal, round, and reactive to light. Conjunctivae and EOM are normal.  Neck: Normal range of motion. Neck supple.  Cardiovascular: Normal rate, regular rhythm and normal heart sounds.  No murmur heard. Pulmonary/Chest: Effort normal and breath sounds normal. No respiratory distress. He has no wheezes. He has no rales.  Abdominal: Soft. There is no tenderness.  Musculoskeletal: Normal range of motion.  Neurological: He is alert and oriented to person, place, and time. He has normal reflexes.  Skin: Skin is warm and dry.  Psychiatric: He has a normal mood and affect. His behavior is normal. Judgment and thought content normal.    No results found for: HGBA1C  Lab Results  Component Value Date   WBC 7.0 03/27/2017   HGB 14.3 03/27/2017   HCT  43.6 03/27/2017   PLT 140 (L) 03/27/2017   GLUCOSE 92 03/27/2017   CHOL 208 (H) 06/17/2016   TRIG 160 (H) 06/17/2016   HDL 40 06/17/2016   LDLCALC 136 (H) 06/17/2016   ALT 23 03/27/2017   AST 22 03/27/2017   NA 144 03/27/2017   K 4.5 03/27/2017   CL 104 03/27/2017   CREATININE 1.26 03/27/2017   BUN 22 03/27/2017   CO2 25 03/27/2017    Mr Cervical Spine Wo Contrast  Result Date:  09/26/2014 CLINICAL DATA:  72 year old male with increasing cervical neck pain radiating to the left arm with bilateral upper extremity weakness. Symptoms for 3 months with no known injury. Remote spine surgery in the 1990s. EXAM: MRI CERVICAL SPINE WITHOUT CONTRAST TECHNIQUE: Multiplanar, multisequence MR imaging of the cervical spine was performed. No intravenous contrast was administered. COMPARISON:  None. FINDINGS: Previous fusion of the C5 through C7 cervical vertebrae. Focal lordosis at C4-C5. No marrow edema or evidence of acute osseous abnormality. Cervicomedullary junction is within normal limits. No cervical spinal cord signal abnormality identified. Dominant left vertebral artery. No marrow edema or evidence of acute osseous abnormality. C2-C3: Moderate facet hypertrophy on the right. Mild broad-based central disc protrusion or disc osteophyte complex. Borderline to mild spinal stenosis. No significant foraminal stenosis. C3-C4: Right eccentric circumferential disc osteophyte complex with moderate facet hypertrophy greater on the right. Spinal stenosis with no cord mass effect. Severe bilateral C4 foraminal stenosis. C4-C5: Left eccentric circumferential disc osteophyte complex. Moderate facet hypertrophy greater on the right. Spinal stenosis with no cord mass effect. Severe left and moderate right C5 foraminal stenosis. C5-C6:  Previous fusion.  No significant stenosis. C6-C7: Previous fusion. Residual uncovertebral hypertrophy and mild to moderate C7 foraminal stenosis. C7-T1: Anterior eccentric disc osteophyte complex. Mild facet and uncovertebral hypertrophy. Mild right greater than left C8 foraminal stenosis. 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 IMPRESSION: 1. Previous C5-C6 and C6-C7 cervical fusion. 2. Multifactorial mild spinal stenosis with no cord mass effect from C2-C3 to C4-C5. Severe bilateral C4 and moderate to severe bilateral C5  neural foraminal stenosis. Electronically Signed   By: Genevie Ann M.D.   On: 09/26/2014 20:18    Assessment & Plan:   Carlito was seen today for medical management of chronic issues.  Diagnoses and all orders for this visit:  Hyperlipidemia, unspecified hyperlipidemia type -     CBC with Differential/Platelet -     CMP14+EGFR -     Lipid panel  Encounter for immunization -     Flu vaccine HIGH DOSE PF  Vitamin D deficiency -     VITAMIN D 25 Hydroxy (Vit-D Deficiency, Fractures)  Screening for colon cancer -     Ambulatory referral to Gastroenterology  Special screening for malignant neoplasm of prostate -     PSA, total and free  Other orders -     atorvastatin (LIPITOR) 80 MG tablet; Take 1 tablet (80 mg total) by mouth daily. -     celecoxib (CELEBREX) 400 MG capsule; TAKE 1 CAPSULE BY MOUTH ONCE DAILY WITH FOOD -     sildenafil (REVATIO) 20 MG tablet; Take 1 tablet (20 mg total) by mouth daily as needed (2-5 as needed).   I am having Fernando Pratt maintain his pyridOXINE, Vitamin D3, Nutritional Supplements (ARTHRO-COMPLEX PO), atorvastatin, celecoxib, and sildenafil.  Meds ordered this encounter  Medications  . atorvastatin (LIPITOR) 80 MG tablet    Sig: Take 1  tablet (80 mg total) by mouth daily.    Dispense:  90 tablet    Refill:  1  . celecoxib (CELEBREX) 400 MG capsule    Sig: TAKE 1 CAPSULE BY MOUTH ONCE DAILY WITH FOOD    Dispense:  90 capsule    Refill:  1  . sildenafil (REVATIO) 20 MG tablet    Sig: Take 1 tablet (20 mg total) by mouth daily as needed (2-5 as needed).    Dispense:  10 tablet    Refill:  5     Follow-up: Return in about 6 months (around 03/28/2018).  Claretta Fraise, M.D.

## 2017-09-28 LAB — CBC WITH DIFFERENTIAL/PLATELET
Basophils Absolute: 0 10*3/uL (ref 0.0–0.2)
Basos: 1 %
EOS (ABSOLUTE): 0.2 10*3/uL (ref 0.0–0.4)
EOS: 3 %
HEMATOCRIT: 43.8 % (ref 37.5–51.0)
Hemoglobin: 14.3 g/dL (ref 13.0–17.7)
IMMATURE GRANS (ABS): 0.1 10*3/uL (ref 0.0–0.1)
Immature Granulocytes: 1 %
LYMPHS: 22 %
Lymphocytes Absolute: 1.1 10*3/uL (ref 0.7–3.1)
MCH: 29.1 pg (ref 26.6–33.0)
MCHC: 32.6 g/dL (ref 31.5–35.7)
MCV: 89 fL (ref 79–97)
MONOS ABS: 0.5 10*3/uL (ref 0.1–0.9)
Monocytes: 9 %
Neutrophils Absolute: 3.3 10*3/uL (ref 1.4–7.0)
Neutrophils: 64 %
PLATELETS: 125 10*3/uL — AB (ref 150–450)
RBC: 4.91 x10E6/uL (ref 4.14–5.80)
RDW: 12.1 % — AB (ref 12.3–15.4)
WBC: 5.1 10*3/uL (ref 3.4–10.8)

## 2017-09-28 LAB — LIPID PANEL
CHOLESTEROL TOTAL: 208 mg/dL — AB (ref 100–199)
Chol/HDL Ratio: 5.1 ratio — ABNORMAL HIGH (ref 0.0–5.0)
HDL: 41 mg/dL (ref >39)
LDL Calculated: 147 mg/dL — ABNORMAL HIGH (ref 0–99)
Triglycerides: 100 mg/dL (ref 0–149)
VLDL Cholesterol Cal: 20 mg/dL (ref 5–40)

## 2017-09-28 LAB — CMP14+EGFR
ALT: 19 IU/L (ref 0–44)
AST: 15 IU/L (ref 0–40)
Albumin/Globulin Ratio: 2 (ref 1.2–2.2)
Albumin: 3.9 g/dL (ref 3.5–4.8)
Alkaline Phosphatase: 63 IU/L (ref 39–117)
BUN/Creatinine Ratio: 17 (ref 10–24)
BUN: 23 mg/dL (ref 8–27)
Bilirubin Total: 0.8 mg/dL (ref 0.0–1.2)
CALCIUM: 8.8 mg/dL (ref 8.6–10.2)
CHLORIDE: 105 mmol/L (ref 96–106)
CO2: 22 mmol/L (ref 20–29)
CREATININE: 1.32 mg/dL — AB (ref 0.76–1.27)
GFR calc Af Amer: 62 mL/min/{1.73_m2} (ref >59)
GFR, EST NON AFRICAN AMERICAN: 54 mL/min/{1.73_m2} — AB (ref >59)
Globulin, Total: 2 g/dL (ref 1.5–4.5)
Glucose: 99 mg/dL (ref 65–99)
POTASSIUM: 4.5 mmol/L (ref 3.5–5.2)
SODIUM: 144 mmol/L (ref 134–144)
Total Protein: 5.9 g/dL — ABNORMAL LOW (ref 6.0–8.5)

## 2017-09-28 LAB — PSA, TOTAL AND FREE
PROSTATE SPECIFIC AG, SERUM: 0.5 ng/mL (ref 0.0–4.0)
PSA FREE PCT: 54 %
PSA FREE: 0.27 ng/mL

## 2017-09-28 LAB — VITAMIN D 25 HYDROXY (VIT D DEFICIENCY, FRACTURES): VIT D 25 HYDROXY: 34.4 ng/mL (ref 30.0–100.0)

## 2017-11-13 ENCOUNTER — Encounter: Payer: Self-pay | Admitting: Family Medicine

## 2018-03-28 ENCOUNTER — Telehealth (INDEPENDENT_AMBULATORY_CARE_PROVIDER_SITE_OTHER): Payer: PPO | Admitting: Family Medicine

## 2018-03-28 ENCOUNTER — Other Ambulatory Visit: Payer: Self-pay

## 2018-03-28 DIAGNOSIS — E559 Vitamin D deficiency, unspecified: Secondary | ICD-10-CM | POA: Diagnosis not present

## 2018-03-28 DIAGNOSIS — M4712 Other spondylosis with myelopathy, cervical region: Secondary | ICD-10-CM

## 2018-03-28 DIAGNOSIS — E785 Hyperlipidemia, unspecified: Secondary | ICD-10-CM | POA: Diagnosis not present

## 2018-03-28 MED ORDER — ATORVASTATIN CALCIUM 80 MG PO TABS: 80.0000 mg | ORAL_TABLET | Freq: Every day | ORAL | 1 refills | Status: DC

## 2018-03-28 NOTE — Progress Notes (Signed)
   Virtual Visit via telephone Note  I connected with Fernando Pratt on 03/28/18 at 8:06 by telephone and verified that I am speaking with the correct person using two identifiers. Fernando Pratt is currently located at home . The provider, Mechele Claude, MD is located in his office at time of visit.  I discussed the limitations, risks, security and privacy concerns of performing an evaluation and management service by telephone and the availability of in person appointments. I also discussed with the patient that there may be a patient responsible charge related to this service. The patient expressed understanding and agreed to proceed.  History of Present Illness  Patient in for follow-up of elevated cholesterol. Doing well without complaints on current medication. Denies side effects of statin including myalgia and arthralgia and nausea. Also in today for liver function testing. Currently no chest pain, shortness of breath or other cardiovascular related symptoms noted.  Patient also states that he has had some minimal low back pain to mid back pain but that has resolved.  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  However, his pain has been very manageable in spite of that.  Patient's wife had some shoulder surgery so she has not been sexually active with him.  But he does plan on using the sildenafil once opportunity is available.      Assessment and Plan: 1. Hyperlipidemia, unspecified hyperlipidemia type   2. Spondylosis, cervical, with myelopathy   3. Vitamin D deficiency     Meds ordered this encounter  Medications  . atorvastatin (LIPITOR) 80 MG tablet    Sig: Take 1 tablet (80 mg total) by mouth daily.    Dispense:  90 tablet    Refill:  1     Follow Up Instructions:     I discussed the assessment and treatment plan with the patient. The patient was provided an  opportunity to ask questions and all were answered. The patient agreed with the plan and demonstrated an understanding of the instructions.   The patient was advised to call back or seek an in-person evaluation if the symptoms worsen or if the condition fails to improve as anticipated.  The above assessment and management plan was discussed with the patient. The patient verbalized understanding of and has agreed to the management plan. Patient is aware to call the clinic if symptoms persist or worsen. Patient is aware when to return to the clinic for a follow-up visit. Patient educated on when it is appropriate to go to the emergency department.    I provided 9 minutes of non-face-to-face time during this encounter. Visit ended at 8:15 AM.    Mechele Claude, MD

## 2018-11-28 ENCOUNTER — Other Ambulatory Visit: Payer: Self-pay

## 2018-11-28 ENCOUNTER — Ambulatory Visit: Payer: PPO

## 2019-03-27 ENCOUNTER — Ambulatory Visit (INDEPENDENT_AMBULATORY_CARE_PROVIDER_SITE_OTHER): Payer: PPO | Admitting: *Deleted

## 2019-03-27 DIAGNOSIS — Z0000 Encounter for general adult medical examination without abnormal findings: Secondary | ICD-10-CM | POA: Diagnosis not present

## 2019-03-27 NOTE — Progress Notes (Signed)
MEDICARE ANNUAL WELLNESS VISIT  03/27/2019  Telephone Visit Disclaimer This Medicare AWV was conducted by telephone due to national recommendations for restrictions regarding the COVID-19 Pandemic (e.g. social distancing).  I verified, using two identifiers, that I am speaking with Fernando Pratt or their authorized healthcare agent. I discussed the limitations, risks, security, and privacy concerns of performing an evaluation and management service by telephone and the potential availability of an in-person appointment in the future. The patient expressed understanding and agreed to proceed.   Subjective:  Fernando Pratt is a 74 y.o. male patient of Fernando Pratt, Fernando John, MD who had a Medicare Annual Wellness Visit today via telephone. 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 He lives at home with his girlfriend Fernando Pratt. He has 3 grown sons.  He reports that he is socially active and does interact with friends/family regularly. He is moderately physically active due to his job. He enjoys playing with his 5 dogs.   Patient Care Team: Fernando Claude, MD as PCP - General (Family Medicine) Fernando Copas, MD as Referring Physician (Optometry)  Advanced Directives 03/27/2019 08/10/2017 06/17/2016 12/07/2015 01/13/2015 11/10/2014  Does Patient Have a Medical Advance Directive? No Yes No No No No  Type of Advance Directive - Living will - - - -  Does patient want to make changes to medical advance directive? - No - Patient declined - - - -  Would patient like information on creating a medical advance directive? No - Patient declined - No - Patient declined - - No - patient declined information    Hospital Utilization Over the Past 12 Months: # of hospitalizations or ER visits: 0 # of surgeries: 0  Review of Systems    Patient reports that his overall health is unchanged compared to last year.  History obtained from the patient.  Patient Reported Readings (BP, Pulse, CBG, Weight,  etc) none  Pain Assessment Pain : No/denies pain     Current Medications & Allergies (verified) Allergies as of 03/27/2019   No Known Allergies     Medication List       Accurate as of March 27, 2019  8:37 AM. If you have any questions, ask your nurse or doctor.        STOP taking these medications   ARTHRO-COMPLEX PO   atorvastatin 80 MG tablet Commonly known as: LIPITOR   sildenafil 20 MG tablet Commonly known as: REVATIO     TAKE these medications   pyridOXINE 100 MG tablet Commonly known as: VITAMIN B-6 Take 100 mg by mouth daily.   Vitamin D3 50 MCG (2000 UT) Tabs Take by mouth.       History (reviewed): Past Medical History:  Diagnosis Date  . Arthritis   . Hyperlipidemia    Past Surgical History:  Procedure Laterality Date  . NECK SURGERY  1991   Family History  Problem Relation Age of Onset  . Cancer Mother        colon and breast  . Arthritis Mother   . Healthy Son   . Healthy Son   . Healthy Son    Social History   Socioeconomic History  . Marital status: Divorced    Spouse name: Not on file  . Number of children: 3  . Years of education: 77  . Highest education level: 12th grade  Occupational History  . Occupation: Personnel officer    Comment: 52 years  Tobacco Use  . Smoking status: Former Smoker  Types: Cigars  . Smokeless tobacco: Never Used  Substance and Sexual Activity  . Alcohol use: No  . Drug use: No  . Sexual activity: Not Currently  Other Topics Concern  . 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 Lives at home with girlfriend Fernando Pratt. He has 3 grown sons. Enjoys playing with his 5 dogs.   Social Determinants of Health   Financial Resource Strain:   . Difficulty of Paying Living Expenses:   Food Insecurity:   . Worried About Programme researcher, broadcasting/film/video in the Last Year:   . Barista in the Last Year:   Transportation Needs:   . Freight forwarder  (Medical):   Marland Kitchen Lack of Transportation (Non-Medical):   Physical Activity:   . Days of Exercise per Week:   . Minutes of Exercise per Session:   Stress:   . Feeling of Stress :   Social Connections:   . Frequency of Communication with Friends and Family:   . Frequency of Social Gatherings with Friends and Family:   . Attends Religious Services:   . Active Member of Clubs or Organizations:   . Attends Banker Meetings:   Marland Kitchen Marital Status:     Activities of Daily Living In your present state of health, do you have any difficulty performing the following activities: 03/27/2019  Hearing? Y  Comment some hearing loss  Vision? N  Difficulty concentrating or making decisions? N  Walking or climbing stairs? N  Dressing or bathing? N  Doing errands, shopping? N  Preparing Food and eating ? N  Using the Toilet? N  In the past six months, have you accidently leaked urine? N  Do you have problems with loss of bowel control? N  Managing your Medications? N  Managing your Finances? N  Housekeeping or managing your Housekeeping? N  Some recent data might be hidden    Patient Education/ Literacy How often do you need to have someone help you when you read instructions, pamphlets, or other written materials from your doctor or pharmacy?: 1 - Never What is the last grade level you completed in school?: some college  Exercise Current Exercise Habits: The patient has a physically strenuous job, but has no regular exercise apart from work., Exercise limited by: None identified  Diet Patient reports consuming 3 meals a day and 1 snack(s) a day Patient reports that his primary diet is: Regular Patient reports that she does have regular access to food.   Depression Screen PHQ 2/9 Scores 03/27/2019 09/27/2017 08/10/2017 03/27/2017 06/22/2016 06/17/2016 09/22/2015  PHQ - 2 Score 0 0 0 0 0 0 0  PHQ- 9 Score - - - - - - -     Fall Risk Fall Risk  03/27/2019 11/28/2018 09/27/2017 08/10/2017  03/27/2017  Falls in the past year? 0 1 No No No  Comment - Emmi Telephone Survey: data to providers prior to load - - -  Number falls in past yr: - 1 - - -  Comment - Emmi Telephone Survey Actual Response = 1 - - -  Injury with Fall? - 0 - - -     Objective:  Fernando Pratt seemed alert and oriented and he participated appropriately during our telephone visit.  Blood Pressure Weight BMI  BP Readings from Last 3 Encounters:  09/27/17 135/74  08/10/17 137/79  03/27/17 128/76   Wt Readings from Last 3 Encounters:  09/27/17 244 lb (110.7 kg)  08/10/17 240 lb (108.9 kg)  03/27/17 238 lb (108 kg)   BMI Readings from Last 1 Encounters:  09/27/17 34.03 kg/m    *Unable to obtain current vital signs, weight, and BMI due to telephone visit type  Hearing/Vision  . Finch did not seem to have difficulty with hearing/understanding during the telephone conversation . Reports that he has had a formal eye exam by an eye care professional within the past year . Reports that he has not had a formal hearing evaluation within the past year *Unable to fully assess hearing and vision during telephone visit type  Cognitive Function: 6CIT Screen 03/27/2019  What Year? 0 points  What month? 0 points  What time? 0 points  Count back from 20 0 points  Months in reverse 0 points  Repeat phrase 0 points  Total Score 0   (Normal:0-7, Significant for Dysfunction: >8)  Normal Cognitive Function Screening: Yes   Immunization & Health Maintenance Record Immunization History  Administered Date(s) Administered  . Influenza, High Dose Seasonal PF 09/27/2017  . Influenza,inj,Quad PF,6+ Mos 11/03/2014, 09/22/2015    Health Maintenance  Topic Date Due  . TETANUS/TDAP  Never done  . PNA vac Low Risk Adult (1 of 2 - PCV13) Never done  . COLONOSCOPY  08/10/2017  . INFLUENZA VACCINE  04/03/2019 (Originally 08/04/2018)  . Hepatitis C Screening  Completed       Assessment  This is a routine  wellness examination for Fernando Pratt.  Health Maintenance: Due or Overdue Health Maintenance Due  Topic Date Due  . TETANUS/TDAP  Never done  . PNA vac Low Risk Adult (1 of 2 - PCV13) Never done  . COLONOSCOPY  08/10/2017    Fernando Pratt does not need a referral for Community Assistance: Care Management:   no Social Work:    no Prescription Assistance:  no Nutrition/Diabetes Education:  no   Plan:  Personalized Goals Goals Addressed            This Visit's Progress   . Patient Stated       03/27/2019 AWV Goal: Exercise for General Health   Patient will verbalize understanding of the benefits of increased physical activity:  Exercising regularly is important. It will improve your overall fitness, flexibility, and endurance.  Regular exercise also will improve your overall health. It can help you control your weight, reduce stress, and improve your bone density.  Over the next year, patient will increase physical activity as tolerated with a goal of at least 150 minutes of moderate physical activity per week.   You can tell that you are exercising at a moderate intensity if your heart starts beating faster and you start breathing faster but can still hold a conversation.  Moderate-intensity exercise ideas include:  Walking 1 mile (1.6 km) in about 15 minutes  Biking  Hiking  Golfing  Dancing  Water aerobics  Patient will verbalize understanding of everyday activities that increase physical activity by providing examples like the following: ? Yard work, such as: ? Pushing a Surveyor, mining ? Raking and bagging leaves ? Washing your car ? Pushing a stroller ? Shoveling snow ? Gardening ? Washing windows or floors  Patient will be able to explain general safety guidelines for exercising:   Before you start a new exercise program, talk with your health care provider.  Do not exercise so much that you hurt yourself, feel dizzy, or get very short of  breath.  Wear comfortable clothes and wear shoes with good support.  Drink plenty of water while you exercise to prevent dehydration or heat stroke.  Work out until your breathing and your heartbeat get faster.       Personalized Health Maintenance & Screening Recommendations  Colorectal cancer screening  Lung Cancer Screening Recommended: no (Low Dose CT Chest recommended if Age 22-80 years, 30 pack-year currently smoking OR have quit w/in past 15 years) Hepatitis C Screening recommended: no HIV Screening recommended: no  Advanced Directives: Written information was not prepared per patient's request.  Referrals & Orders No orders of the defined types were placed in this encounter.   Follow-up Plan . Follow-up with Claretta Fraise, MD as planned . Schedule colonoscopy  I have personally reviewed and noted the following in the patient's chart:   . Medical and social history . Use of alcohol, tobacco or illicit drugs  . Current medications and supplements . Functional ability and status . Nutritional status . Physical activity . Advanced directives . List of other physicians . Hospitalizations, surgeries, and ER visits in previous 12 months . Vitals . Screenings to include cognitive, depression, and falls . Referrals and appointments  In addition, I have reviewed and discussed with Sandford Craze certain preventive protocols, quality metrics, and best practice recommendations. A written personalized care plan for preventive services as well as general preventive health recommendations is available and can be mailed to the patient at his request.      Baldomero Lamy, LPN  1/49/7026

## 2019-11-22 DIAGNOSIS — H2513 Age-related nuclear cataract, bilateral: Secondary | ICD-10-CM | POA: Diagnosis not present

## 2019-11-22 DIAGNOSIS — H40033 Anatomical narrow angle, bilateral: Secondary | ICD-10-CM | POA: Diagnosis not present

## 2019-12-03 ENCOUNTER — Ambulatory Visit: Payer: PPO | Admitting: Nurse Practitioner

## 2020-03-04 ENCOUNTER — Ambulatory Visit (INDEPENDENT_AMBULATORY_CARE_PROVIDER_SITE_OTHER): Payer: PPO | Admitting: Family Medicine

## 2020-03-04 ENCOUNTER — Encounter: Payer: Self-pay | Admitting: Family Medicine

## 2020-03-04 ENCOUNTER — Other Ambulatory Visit: Payer: Self-pay

## 2020-03-04 VITALS — BP 127/80 | HR 100 | Temp 99.5°F | Ht 71.0 in | Wt 244.6 lb

## 2020-03-04 DIAGNOSIS — Z23 Encounter for immunization: Secondary | ICD-10-CM

## 2020-03-04 DIAGNOSIS — N5089 Other specified disorders of the male genital organs: Secondary | ICD-10-CM | POA: Diagnosis not present

## 2020-03-04 DIAGNOSIS — M79604 Pain in right leg: Secondary | ICD-10-CM

## 2020-03-04 MED ORDER — PREDNISONE 10 MG PO TABS: ORAL_TABLET | ORAL | 0 refills | Status: DC

## 2020-03-04 NOTE — Progress Notes (Signed)
Subjective:  Patient ID: Fernando Pratt, male    DOB: July 20, 1945  Age: 75 y.o. MRN: [id number]  CC: Leg Pain (Right leg pain on going problem but worse ), Groin Swelling (Couple of months left testicle ), and Flu Vaccine   HPI Fernando Pratt presents for chronic right leg pain. Posterior thigh and knee are pulling  Onset 3 months ago. It makes it hard for him to walk.   Pt. Also has had swelling for 2 months in the left testicle. No pain. No dysuria.   Depression screen [facility] 2/9 03/04/2020 03/27/2019 09/27/2017  Decreased Interest 0 0 0  Down, Depressed, Hopeless 0 0 0  PHQ - 2 Score 0 0 0  Altered sleeping - - -  Tired, decreased energy - - -  Change in appetite - - -  Feeling bad or failure about yourself  - - -  Trouble concentrating - - -  Moving slowly or fidgety/restless - - -  Suicidal thoughts - - -  PHQ-9 Score - - -    History Temitayo has a past medical history of Arthritis and Hyperlipidemia.   He has a past surgical history that includes Neck surgery (1991).   His family history includes Arthritis in his mother; Cancer in his mother; Healthy in his son, son, and son.He reports that he has quit smoking. His smoking use included cigars. He has never used smokeless tobacco. He reports that he does not drink alcohol and does not use drugs.    ROS Review of Systems  Constitutional: Negative for fever.  Respiratory: Negative for shortness of breath.   Cardiovascular: Negative for chest pain.  Musculoskeletal: Positive for arthralgias.  Skin: Negative for rash.    Objective:  BP 127/80   Pulse 100   Temp 99.5 F (37.5 C) (Temporal)   Ht 5\' 11"  (1.803 m)   Wt 244 lb 9.6 oz (110.9 kg)   BMI 34.11 kg/m   BP Readings from Last 3 Encounters:  03/04/20 127/80  09/27/17 135/74  08/10/17 137/79    Wt Readings from Last 3 Encounters:  03/04/20 244 lb 9.6 oz (110.9 kg)  09/27/17 244 lb (110.7 kg)  08/10/17 240 lb (108.9 kg)     Physical Exam Vitals  reviewed.  Constitutional:      Appearance: He is well-developed and well-nourished.  HENT:     Head: Normocephalic and atraumatic.     Right Ear: External ear normal.     Left Ear: External ear normal.     Mouth/Throat:     Pharynx: No oropharyngeal exudate or posterior oropharyngeal erythema.  Eyes:     Pupils: Pupils are equal, round, and reactive to light.  Cardiovascular:     Rate and Rhythm: Normal rate and regular rhythm.     Heart sounds: No murmur heard.   Pulmonary:     Effort: No respiratory distress.     Breath sounds: Normal breath sounds.  Musculoskeletal:     Cervical back: Normal range of motion and neck supple.  Neurological:     Mental Status: He is alert and oriented to person, place, and time.       Assessment & Plan:   Zyrus was seen today for leg pain, groin swelling and flu vaccine.  Diagnoses and all orders for this visit:  Swelling of left testicle -     Gerlene Burdock SCROTUM W/DOPPLER; Future  Need for immunization against influenza -     Flu Vaccine QUAD High Dose(Fluad)  Pain in right leg -     predniSONE (DELTASONE) 10 MG tablet; Take 5 daily for 3 days followed by 4,3,2 and 1 for 3 days each. -     Ambulatory referral to Physical Therapy       I am having Fernando Pratt start on predniSONE. I am also having him maintain his pyridOXINE and Vitamin D3.  Allergies as of 03/04/2020   No Known Allergies     Medication List       Accurate as of March 04, 2020  9:44 PM. If you have any questions, ask your nurse or doctor.        predniSONE 10 MG tablet Commonly known as: DELTASONE Take 5 daily for 3 days followed by 4,3,2 and 1 for 3 days each. Started by: Mechele Claude, MD   pyridOXINE 100 MG tablet Commonly known as: VITAMIN B-6 Take 100 mg by mouth daily.   Vitamin D3 50 MCG (2000 UT) Tabs Take by mouth.        Follow-up: Return in about 6 weeks (around 04/15/2020).  Mechele Claude, M.D.

## 2020-03-10 ENCOUNTER — Encounter: Payer: Self-pay | Admitting: Physical Therapy

## 2020-03-10 ENCOUNTER — Other Ambulatory Visit: Payer: Self-pay

## 2020-03-10 ENCOUNTER — Other Ambulatory Visit: Payer: Self-pay | Admitting: Family Medicine

## 2020-03-10 ENCOUNTER — Ambulatory Visit (HOSPITAL_COMMUNITY)
Admission: RE | Admit: 2020-03-10 | Discharge: 2020-03-10 | Disposition: A | Discharge status: Home / Self Care | Payer: PPO | Source: Ambulatory Visit | Attending: Family Medicine | Admitting: Family Medicine

## 2020-03-10 ENCOUNTER — Telehealth: Payer: Self-pay

## 2020-03-10 ENCOUNTER — Other Ambulatory Visit: Payer: Self-pay | Admitting: *Deleted

## 2020-03-10 ENCOUNTER — Ambulatory Visit: Payer: PPO | Attending: Family Medicine | Admitting: Physical Therapy

## 2020-03-10 DIAGNOSIS — Z8639 Personal history of other endocrine, nutritional and metabolic disease: Secondary | ICD-10-CM

## 2020-03-10 DIAGNOSIS — G8929 Other chronic pain: Secondary | ICD-10-CM | POA: Diagnosis not present

## 2020-03-10 DIAGNOSIS — M25561 Pain in right knee: Secondary | ICD-10-CM | POA: Insufficient documentation

## 2020-03-10 DIAGNOSIS — N5089 Other specified disorders of the male genital organs: Secondary | ICD-10-CM | POA: Diagnosis not present

## 2020-03-10 DIAGNOSIS — Z139 Encounter for screening, unspecified: Secondary | ICD-10-CM

## 2020-03-10 DIAGNOSIS — N433 Hydrocele, unspecified: Secondary | ICD-10-CM | POA: Diagnosis not present

## 2020-03-10 DIAGNOSIS — E785 Hyperlipidemia, unspecified: Secondary | ICD-10-CM

## 2020-03-10 DIAGNOSIS — Z125 Encounter for screening for malignant neoplasm of prostate: Secondary | ICD-10-CM

## 2020-03-10 NOTE — Telephone Encounter (Signed)
Patient aware and agreeable to additional labs. Ordered as requested.

## 2020-03-10 NOTE — Therapy (Signed)
[facility] [address] [place], [place], [address] Phone: [phone]   Fax:  [phone]  Physical Therapy Evaluation  Patient Details  Name: Fernando Pratt MRN: [id number] Date of Birth: May 16, 1945 Referring Provider (PT): Mechele Claude MD   Encounter Date: 03/10/2020   PT End of Session - 03/10/20 0911    Visit Number 1    Number of Visits 12    Date for PT Re-Evaluation 04/21/20    PT Start Time 0807    PT Stop Time 0853    PT Time Calculation (min) 46 min    Activity Tolerance Patient tolerated treatment well    Behavior During Therapy [facility] for tasks assessed/performed           Past Medical History:  Diagnosis Date  . Arthritis   . Hyperlipidemia     Past Surgical History:  Procedure Laterality Date  . NECK SURGERY  1991    There were no vitals filed for this visit.    Subjective Assessment - 03/10/20 0915    Subjective COVID-19 screen performed prior to patient entering clinic.  The patient presents to the clinic today with c/o right knee pain.  This has been ongoing for many years.  He also reports pain along his right hamstring to calf.  His right calf is smaller than left.  He reports no significant h/o lumbar issues.    Pertinent History H/o right ankle pain, h/o bilateral shoulder pain, OA, neck surgery (1991).    Patient Stated Goals Decrease pain and get stronger.    Currently in Pain? Yes    Pain Location Knee    Pain Orientation Right    Pain Descriptors / Indicators Aching    Pain Type Chronic pain    Pain Onset More than a month ago    Pain Frequency Intermittent    Aggravating Factors  Work.    Pain Relieving Factors Rest.              [facility] PT Assessment - 03/10/20 0001      Assessment   Medical Diagnosis Pain in right leg.    Referring Provider (PT) Mechele Claude MD    Onset Date/Surgical Date --   Ongoing.     Precautions   Precautions None      Restrictions   Weight Bearing  Restrictions No      Balance Screen   Has the patient fallen in the past 6 months No    Has the patient had a decrease in activity level because of a fear of falling?  No    Is the patient reluctant to leave their home because of a fear of falling?  No      Home Environment   Living Environment Private residence      Prior Function   Level of Independence Independent      Deep Tendon Reflexes   DTR Assessment Site Patella;Achilles    Patella DTR 1+    Achilles DTR 0      ROM / Strength   AROM / PROM / Strength AROM;Strength      AROM   Overall AROM Comments WFL for right LE with some decrease in right hip IR.      Strength   Overall Strength Comments Right hip, knee and ankle is essentially normal strength.      Palpation   Palpation comment Right distal ITB pain.  C/o pain along right hamstring to calf but not palpably tender.  Special Tests   Other special tests mid-calf measurement 4 cms < right.      Ambulation/Gait   Gait Comments Essentially normal.                      Objective measurements completed on examination: See above findings.       [facility] Adult PT Treatment/Exercise - 03/10/20 0001      Modalities   Modalities Electrical Stimulation      Electrical Stimulation   Electrical Stimulation Location Right distal ITB.    Electrical Stimulation Action Pre-mod.    Electrical Stimulation Parameters 80-150 Hz x 20 minutes.    Electrical Stimulation Goals Pain                       PT Long Term Goals - 03/10/20 8527      PT LONG TERM GOAL #1   Title Independent with a HEP.    Time 6    Period Weeks    Status New      PT LONG TERM GOAL #2   Title Perform ADL's with right knee/LE pain-level not greater than a 3/10.    Time 6    Period Weeks    Status New      PT LONG TERM GOAL #3   Title Walk a community distance with pain not > 3/10.    Time 6    Period Weeks    Status New                  Plan -  03/10/20 0945    Clinical Impression Statement The patient presents to OPPT with c/o right knee pain.  He is palpably tender over his right distal ITB region.  His right LE strength is essentially normal though he has significant right calf atrophy.  He has pain complaints on occasions along his right hamstrings to calf but this area is not palpably tender.  Patient will benefit from skilled physical therapy intervention to address pain and deficits.    Personal Factors and Comorbidities Comorbidity 1;Other;Comorbidity 2    Comorbidities H/o right ankle pain, h/o bilateral shoulder pain, OA, neck surgery (1991).    Examination-Activity Limitations Other;Locomotion Level    Examination-Participation Restrictions Other    Stability/Clinical Decision Making Evolving/Moderate complexity    Clinical Decision Making Low    Rehab Potential Good    PT Frequency 2x / week    PT Duration 6 weeks    PT Treatment/Interventions ADLs/Self Care Home Management;Cryotherapy;Electrical Stimulation;Ultrasound;Moist Heat;Iontophoresis 4mg /ml Dexamethasone;Functional mobility training;Therapeutic activities;Therapeutic exercise;Manual techniques;Patient/family education;Passive range of motion;Dry needling    PT Next Visit Plan Nustep, bike, pain-free right LE ther ex.  Modalities and STW/M as needed to right distal ITB region.    Consulted and Agree with Plan of Care Patient           Patient will benefit from skilled therapeutic intervention in order to improve the following deficits and impairments:  Pain,Decreased activity tolerance,Decreased range of motion  Visit Diagnosis: Chronic pain of right knee - Plan: PT plan of care cert/re-cert     Problem List Patient Active Problem List   Diagnosis Date Noted  . Spondylosis, cervical, with myelopathy 09/22/2015  . Pain in joint, shoulder region, left 09/22/2015  . Hyperlipidemia 09/22/2015  . Neuropathy 09/22/2015  . Vitamin D deficiency 09/22/2015     Wells Gerdeman, 09/24/2015 MPT 03/10/2020, 9:54 AM  [facility] [address]  [place], [place], [address] Phone: [phone]   Fax:  [phone]  Name: Fernando Pratt MRN: [id number] Date of Birth: 12-11-1945

## 2020-03-10 NOTE — Telephone Encounter (Signed)
I ordered the PSA.  And since he has not had any blood drawn in a couple of years it might be a good idea to other such as a CMP and CBC and a lipid if he is willing.  If he wants all of that done he should come in fasting.  If all he wants is the PSA it is already been ordered and he is good to go just drop by any time.

## 2020-03-17 ENCOUNTER — Encounter: Payer: Self-pay | Admitting: Physical Therapy

## 2020-03-17 ENCOUNTER — Other Ambulatory Visit: Payer: Self-pay

## 2020-03-17 ENCOUNTER — Ambulatory Visit: Payer: PPO | Admitting: Physical Therapy

## 2020-03-17 DIAGNOSIS — M25561 Pain in right knee: Secondary | ICD-10-CM | POA: Diagnosis not present

## 2020-03-17 DIAGNOSIS — G8929 Other chronic pain: Secondary | ICD-10-CM

## 2020-03-17 NOTE — Therapy (Signed)
[facility] [address] [place], [place], [address] Phone: [phone]   Fax:  [phone]  Physical Therapy Treatment  Patient Details  Name: Fernando Pratt MRN: [id number] Date of Birth: 02/17/1945 Referring Provider (PT): Mechele Claude MD   Encounter Date: 03/17/2020   PT End of Session - 03/17/20 0743    Visit Number 2    Number of Visits 12    Date for PT Re-Evaluation 04/21/20    PT Start Time 0730    PT Stop Time 0816    PT Time Calculation (min) 46 min    Activity Tolerance Patient tolerated treatment well    Behavior During Therapy [facility] for tasks assessed/performed           Past Medical History:  Diagnosis Date  . Arthritis   . Hyperlipidemia     Past Surgical History:  Procedure Laterality Date  . NECK SURGERY  1991    There were no vitals filed for this visit.   Subjective Assessment - 03/17/20 0743    Subjective COVID-19 screen performed prior to patient entering clinic.  Patient arrives with 1/10 pain but overall doing well.    Pertinent History H/o right ankle pain, h/o bilateral shoulder pain, OA, neck surgery (1991).    Patient Stated Goals Decrease pain and get stronger.    Currently in Pain? Yes    Pain Score 2     Pain Location Knee    Pain Orientation Right    Pain Descriptors / Indicators Aching    Pain Type Chronic pain    Pain Onset More than a month ago    Pain Frequency Intermittent              [facility] PT Assessment - 03/17/20 0001      Assessment   Medical Diagnosis Pain in right leg.    Referring Provider (PT) Mechele Claude MD      Precautions   Precautions None                         [facility] Adult PT Treatment/Exercise - 03/17/20 0001      Exercises   Exercises Knee/Hip      Knee/Hip Exercises: Stretches   Passive Hamstring Stretch Right;2 reps;30 seconds    ITB Stretch Right;2 reps;30 seconds    ITB Stretch Limitations in supine      Knee/Hip  Exercises: Aerobic   Nustep level 3 x10 mins UE/LE      Knee/Hip Exercises: Standing   Hip Flexion AROM;Both;20 reps;Knee bent    Hip Abduction AROM;Both;2 sets;10 reps;Knee straight    Rocker Board 3 minutes      Knee/Hip Exercises: Seated   Long Arc Quad Strengthening;Right;2 sets;10 reps;Weights    Long Arc Quad Weight 3 lbs.    Hamstring Curl Right;2 sets;10 reps      Knee/Hip Exercises: Supine   Bridges AROM;Both;2 sets;10 reps      Modalities   Modalities Primary school teacher Stimulation Location Right distal ITB and distal lateral hamstring    Electrical Stimulation Action pre-mod    Electrical Stimulation Parameters 80-150 hz x10 mins    Electrical Stimulation Goals Pain                       PT Long Term Goals - 03/10/20 0952      PT LONG TERM GOAL #1  Title Independent with a HEP.    Time 6    Period Weeks    Status New      PT LONG TERM GOAL #2   Title Perform ADL's with right knee/LE pain-level not greater than a 3/10.    Time 6    Period Weeks    Status New      PT LONG TERM GOAL #3   Title Walk a community distance with pain not > 3/10.    Time 6    Period Weeks    Status New                 Plan - 03/17/20 0753    Clinical Impression Statement Patient arrives to physical therapy with low levels of right LE pain. Patient guided through standing, seated and supine TEs but did report pain and tingling down R hamstring to calf especially with standing TEs. E-stim provided with reports of a relief of pain.    Personal Factors and Comorbidities Comorbidity 1;Other;Comorbidity 2    Comorbidities H/o right ankle pain, h/o bilateral shoulder pain, OA, neck surgery (1991).    Examination-Activity Limitations Other;Locomotion Level    Examination-Participation Restrictions Other    Stability/Clinical Decision Making Evolving/Moderate complexity    Clinical Decision Making Low    Rehab  Potential Good    PT Frequency 2x / week    PT Duration 6 weeks    PT Treatment/Interventions ADLs/Self Care Home Management;Cryotherapy;Electrical Stimulation;Ultrasound;Moist Heat;Iontophoresis 4mg /ml Dexamethasone;Functional mobility training;Therapeutic activities;Therapeutic exercise;Manual techniques;Patient/family education;Passive range of motion;Dry needling    PT Next Visit Plan Nustep, bike, pain-free right LE ther ex.  Modalities and STW/M as needed to right distal ITB region.    Consulted and Agree with Plan of Care Patient           Patient will benefit from skilled therapeutic intervention in order to improve the following deficits and impairments:  Pain,Decreased activity tolerance,Decreased range of motion  Visit Diagnosis: Chronic pain of right knee     Problem List Patient Active Problem List   Diagnosis Date Noted  . Spondylosis, cervical, with myelopathy 09/22/2015  . Pain in joint, shoulder region, left 09/22/2015  . Hyperlipidemia 09/22/2015  . Neuropathy 09/22/2015  . Vitamin D deficiency 09/22/2015    09/24/2015, PT, DPT 03/17/2020, 8:15 AM  [facility] [address] [place], [place], [place] Phone: [phone]   Fax:  [phone]  Name: Fernando Pratt MRN: [name] Date of Birth: Jan 26, 1945

## 2020-03-24 ENCOUNTER — Encounter: Payer: Self-pay | Admitting: Physical Therapy

## 2020-03-24 ENCOUNTER — Ambulatory Visit: Payer: PPO | Admitting: Physical Therapy

## 2020-03-24 ENCOUNTER — Other Ambulatory Visit: Payer: Self-pay

## 2020-03-24 DIAGNOSIS — M25561 Pain in right knee: Secondary | ICD-10-CM

## 2020-03-24 DIAGNOSIS — G8929 Other chronic pain: Secondary | ICD-10-CM

## 2020-03-24 NOTE — Therapy (Addendum)
[facility] [address] [place], [place], [address] Phone: [phone]   Fax:  [phone]  Physical Therapy Treatment  Patient Details  Name: RYLEE NUZUM MRN: [id number] Date of Birth: 1945/09/21 Referring Provider (PT): Mechele Claude MD   Encounter Date: 03/24/2020   PT End of Session - 03/24/20 0746    Visit Number 3    Number of Visits 12    Date for PT Re-Evaluation 04/21/20    PT Start Time 0735    PT Stop Time 0815    PT Time Calculation (min) 40 min    Activity Tolerance Patient tolerated treatment well    Behavior During Therapy [facility] for tasks assessed/performed           Past Medical History:  Diagnosis Date  . Arthritis   . Hyperlipidemia     Past Surgical History:  Procedure Laterality Date  . NECK SURGERY  1991    There were no vitals filed for this visit.   Subjective Assessment - 03/24/20 0735    Subjective COVID-19 screen performed prior to patient entering clinic. Reports he had to go home early yesterday due to LBP with pain in R hip as well. Patient denies any knee pain today.    Pertinent History H/o right ankle pain, h/o bilateral shoulder pain, OA, neck surgery (1991).    Patient Stated Goals Decrease pain and get stronger.    Currently in Pain? Yes    Pain Score 5     Pain Location Back    Pain Orientation Lower    Pain Descriptors / Indicators Discomfort    Pain Type Chronic pain    Pain Radiating Towards R hip    Pain Onset Yesterday    Pain Frequency Constant              [facility] PT Assessment - 03/24/20 0001      Assessment   Medical Diagnosis Pain in right leg.    Referring Provider (PT) Mechele Claude MD      Precautions   Precautions None                         [facility] Adult PT Treatment/Exercise - 03/24/20 0001      Knee/Hip Exercises: Stretches   Passive Hamstring Stretch Right;3 reps;60 seconds      Knee/Hip Exercises: Aerobic   Nustep level 4 x10  mins UE/LE      Knee/Hip Exercises: Standing   Heel Raises Both;20 reps    Heel Raises Limitations B toe raise x20 reps    Terminal Knee Extension Strengthening;Right;3 sets;10 reps;Theraband    Theraband Level (Terminal Knee Extension) Level 4 (Blue)    Hip Abduction Stengthening;Right;3 sets;10 reps;Knee straight;Limitations    Abduction Limitations blue theraband      Knee/Hip Exercises: Supine   Bridges AROM;Both;5 reps    Straight Leg Raises Strengthening;Right;2 sets;10 reps    Other Supine Knee/Hip Exercises B hip clam red theraband x30 reps                       PT Long Term Goals - 03/10/20 6606      PT LONG TERM GOAL #1   Title Independent with a HEP.    Time 6    Period Weeks    Status New      PT LONG TERM GOAL #2   Title Perform ADL's with right knee/LE pain-level not greater than a  3/10.    Time 6    Period Weeks    Status New      PT LONG TERM GOAL #3   Title Walk a community distance with pain not > 3/10.    Time 6    Period Weeks    Status New                 Plan - 03/24/20 0909    Clinical Impression Statement Patient presented in clinic with reports of a flare of LBP with pain in R hip. No R knee pain reported although patient reports intermittant tightness and cramping of R calf and HS. Patient denied any RLE pain with therex today. No pain reported with bridging. Patient educated that symptoms could be stemming from his chronic LBP into RLE. Symptoms to be monitored and assessed again at next PT visit.    Personal Factors and Comorbidities Comorbidity 1;Other;Comorbidity 2    Comorbidities H/o right ankle pain, h/o bilateral shoulder pain, OA, neck surgery (1991).    Examination-Activity Limitations Other;Locomotion Level    Examination-Participation Restrictions Other    Stability/Clinical Decision Making Evolving/Moderate complexity    Rehab Potential Good    PT Frequency 2x / week    PT Duration 6 weeks    PT  Treatment/Interventions ADLs/Self Care Home Management;Cryotherapy;Electrical Stimulation;Ultrasound;Moist Heat;Iontophoresis 4mg /ml Dexamethasone;Functional mobility training;Therapeutic activities;Therapeutic exercise;Manual techniques;Patient/family education;Passive range of motion;Dry needling    PT Next Visit Plan Nustep, bike, pain-free right LE ther ex.  Modalities and STW/M as needed to right distal ITB region.    Consulted and Agree with Plan of Care Patient           Patient will benefit from skilled therapeutic intervention in order to improve the following deficits and impairments:  Pain,Decreased activity tolerance,Decreased range of motion  Visit Diagnosis: Chronic pain of right knee     Problem List Patient Active Problem List   Diagnosis Date Noted  . Spondylosis, cervical, with myelopathy 09/22/2015  . Pain in joint, shoulder region, left 09/22/2015  . Hyperlipidemia 09/22/2015  . Neuropathy 09/22/2015  . Vitamin D deficiency 09/22/2015    09/24/2015, PTA 03/24/2020, 9:13 AM  [facility] [address] [place], [place], [place] Phone: [phone]   Fax:  [phone]  Name: HASSANI SLINEY MRN: [name] Date of Birth: 29-Apr-1945

## 2020-03-31 ENCOUNTER — Ambulatory Visit: Payer: PPO | Admitting: Physical Therapy

## 2020-03-31 ENCOUNTER — Encounter: Payer: Self-pay | Admitting: Physical Therapy

## 2020-03-31 ENCOUNTER — Other Ambulatory Visit: Payer: Self-pay

## 2020-03-31 DIAGNOSIS — G8929 Other chronic pain: Secondary | ICD-10-CM

## 2020-03-31 DIAGNOSIS — M25561 Pain in right knee: Secondary | ICD-10-CM | POA: Diagnosis not present

## 2020-03-31 NOTE — Therapy (Signed)
[facility] [address] [place], [place], [address] Phone: [phone]   Fax:  [phone]  Physical Therapy Treatment  Patient Details  Name: Fernando Pratt MRN: [id number] Date of Birth: 10/13/1945 Referring Provider (PT): Mechele Claude MD   Encounter Date: 03/31/2020   PT End of Session - 03/31/20 0748    Visit Number 4    Number of Visits 12    Date for PT Re-Evaluation 04/21/20    PT Start Time 0736    PT Stop Time 0815    PT Time Calculation (min) 39 min    Activity Tolerance Patient tolerated treatment well    Behavior During Therapy [facility] for tasks assessed/performed           Past Medical History:  Diagnosis Date  . Arthritis   . Hyperlipidemia     Past Surgical History:  Procedure Laterality Date  . NECK SURGERY  1991    There were no vitals filed for this visit.   Subjective Assessment - 03/31/20 0747    Subjective COVID-19 screen performed prior to patient entering clinic.    Pertinent History H/o right ankle pain, h/o bilateral shoulder pain, OA, neck surgery (1991).    Patient Stated Goals Decrease pain and get stronger.    Currently in Pain? Yes    Pain Score --   "a little"   Pain Location Knee    Pain Orientation Right    Pain Descriptors / Indicators Discomfort    Pain Type Chronic pain    Pain Onset More than a month ago    Pain Frequency Intermittent              [facility] PT Assessment - 03/31/20 0001      Assessment   Medical Diagnosis Pain in right leg.    Referring Provider (PT) Mechele Claude MD      Precautions   Precautions None      Restrictions   Weight Bearing Restrictions No                         [facility] Adult PT Treatment/Exercise - 03/31/20 0001      Knee/Hip Exercises: Aerobic   Nustep level 3 x12 mins UE/LE      Knee/Hip Exercises: Machines for Strengthening   Cybex Knee Extension 10# 3x10 reps    Cybex Knee Flexion 30# 3x10 reps    Cybex Leg Press  1.5 pl, seat 7 x30 reps      Knee/Hip Exercises: Standing   Heel Raises Both;20 reps    Heel Raises Limitations B toe raise x20 reps    Terminal Knee Extension Strengthening;Right;3 sets;10 reps;Theraband    Theraband Level (Terminal Knee Extension) Level 4 (Blue)    Hip Abduction Stengthening;Right;3 sets;10 reps;Knee straight;Limitations    Abduction Limitations blue theraband    Hip Extension Stengthening;Right;3 sets;10 reps;Knee straight;Limitations    Extension Limitations blue theraband    Step Down Right;2 sets;10 reps;Hand Hold: 2;Step Height: 4"    Other Standing Knee Exercises R lateral heel dot 4" step x20 reps      Knee/Hip Exercises: Seated   Sit to Sand 15 reps;without UE support      Knee/Hip Exercises: Supine   Bridges AROM;Both;20 reps    Straight Leg Raises Strengthening;Right;2 sets;10 reps      Knee/Hip Exercises: Sidelying   Hip ABduction AROM;Right;2 sets;10 reps    Clams AROM R hip x20 reps  PT Long Term Goals - 03/10/20 8413      PT LONG TERM GOAL #1   Title Independent with a HEP.    Time 6    Period Weeks    Status New      PT LONG TERM GOAL #2   Title Perform ADL's with right knee/LE pain-level not greater than a 3/10.    Time 6    Period Weeks    Status New      PT LONG TERM GOAL #3   Title Walk a community distance with pain not > 3/10.    Time 6    Period Weeks    Status New                 Plan - 03/31/20 2440    Clinical Impression Statement Patient presented in clinic with minimal R knee pain. Patient guided through therex with resistance but no reports of any increased pain. Intermittant multimodal cueing provided to correct technique.    Personal Factors and Comorbidities Comorbidity 1;Other;Comorbidity 2    Comorbidities H/o right ankle pain, h/o bilateral shoulder pain, OA, neck surgery (1991).    Examination-Activity Limitations Other;Locomotion Level    Examination-Participation  Restrictions Other    Stability/Clinical Decision Making Evolving/Moderate complexity    Rehab Potential Good    PT Frequency 2x / week    PT Duration 6 weeks    PT Treatment/Interventions ADLs/Self Care Home Management;Cryotherapy;Electrical Stimulation;Ultrasound;Moist Heat;Iontophoresis 4mg /ml Dexamethasone;Functional mobility training;Therapeutic activities;Therapeutic exercise;Manual techniques;Patient/family education;Passive range of motion;Dry needling    PT Next Visit Plan Nustep, bike, pain-free right LE ther ex.  Modalities and STW/M as needed to right distal ITB region.    Consulted and Agree with Plan of Care Patient           Patient will benefit from skilled therapeutic intervention in order to improve the following deficits and impairments:  Pain,Decreased activity tolerance,Decreased range of motion  Visit Diagnosis: Chronic pain of right knee     Problem List Patient Active Problem List   Diagnosis Date Noted  . Spondylosis, cervical, with myelopathy 09/22/2015  . Pain in joint, shoulder region, left 09/22/2015  . Hyperlipidemia 09/22/2015  . Neuropathy 09/22/2015  . Vitamin D deficiency 09/22/2015    09/24/2015, PTA 03/31/2020, 9:19 AM  [facility] [address] [place], [place], [place] Phone: [phone]   Fax:  [phone]  Name: OTHEL HOOGENDOORN MRN: Fernando Pratt Date of Birth: 04/10/1945

## 2020-04-07 ENCOUNTER — Other Ambulatory Visit: Payer: Self-pay

## 2020-04-07 ENCOUNTER — Encounter: Payer: Self-pay | Admitting: Physical Therapy

## 2020-04-07 ENCOUNTER — Ambulatory Visit: Payer: PPO | Attending: Family Medicine | Admitting: Physical Therapy

## 2020-04-07 DIAGNOSIS — M25561 Pain in right knee: Secondary | ICD-10-CM | POA: Insufficient documentation

## 2020-04-07 DIAGNOSIS — G8929 Other chronic pain: Secondary | ICD-10-CM | POA: Diagnosis not present

## 2020-04-07 NOTE — Therapy (Signed)
[facility] [address] [place], [place], [address] Phone: [phone]   Fax:  [phone]  Physical Therapy Treatment  Patient Details  Name: Fernando Pratt MRN: [id number] Date of Birth: 12/16/1945 Referring Provider (PT): Mechele Claude MD   Encounter Date: 04/07/2020   PT End of Session - 04/07/20 0752    Visit Number 5    Number of Visits 12    Date for PT Re-Evaluation 04/21/20    PT Start Time 0741    PT Stop Time 0814   late arrival   PT Time Calculation (min) 33 min    Activity Tolerance Patient tolerated treatment well    Behavior During Therapy [facility] for tasks assessed/performed           Past Medical History:  Diagnosis Date  . Arthritis   . Hyperlipidemia     Past Surgical History:  Procedure Laterality Date  . NECK SURGERY  1991    There were no vitals filed for this visit.   Subjective Assessment - 04/07/20 0749    Subjective COVID-19 screen performed prior to patient entering clinic. No new complaints. Reports pain only upon waking and standing. Has pulling on HS and posterior knee.    Pertinent History H/o right ankle pain, h/o bilateral shoulder pain, OA, neck surgery (1991).    Patient Stated Goals Decrease pain and get stronger.    Currently in Pain? No/denies              [facility] PT Assessment - 04/07/20 0001      Assessment   Medical Diagnosis Pain in right leg.    Referring Provider (PT) Mechele Claude MD      Precautions   Precautions None      Restrictions   Weight Bearing Restrictions No                         [facility] Adult PT Treatment/Exercise - 04/07/20 0001      Knee/Hip Exercises: Aerobic   Nustep level 4 x10 mins UE/LE      Knee/Hip Exercises: Machines for Strengthening   Cybex Knee Extension 20# 3x10 reps    Cybex Knee Flexion 30# 3x10 reps    Cybex Leg Press 1.5 pl, seat 7 x30 reps      Knee/Hip Exercises: Standing   Heel Raises Both;20 reps    Heel  Raises Limitations B toe raise x20 reps    Terminal Knee Extension Strengthening;Right;3 sets;10 reps;Theraband    Theraband Level (Terminal Knee Extension) Level 4 (Blue)    Hip Abduction Stengthening;Right;3 sets;10 reps;Knee straight;Limitations    Abduction Limitations blue theraband    Step Down Right;3 sets;10 reps;Hand Hold: 2;Step Height: 6"    Rocker Board 4 minutes    Other Standing Knee Exercises R lateral heel dot 6" step x30 reps                       PT Long Term Goals - 04/07/20 0753      PT LONG TERM GOAL #1   Title Independent with a HEP.    Time 6    Period Weeks    Status On-going      PT LONG TERM GOAL #2   Title Perform ADL's with right knee/LE pain-level not greater than a 3/10.    Time 6    Period Weeks    Status Achieved      PT LONG TERM GOAL #  3   Title Walk a community distance with pain not > 3/10.    Time 6    Period Weeks    Status Achieved                 Plan - 04/07/20 4270    Clinical Impression Statement Patient presented in clinic with no complaints of R knee pain. Patient able to achieve goals in regards to ADLs, ambulation with less pain. Patient continues to experience tightness of posterior knee, HS region but does have prior history of LBP. Patient progressed through more machine strengthening which he noted more weakness with resisted knee extension. Patient aware of meeting goals but states that he wishes to do more PT.    Personal Factors and Comorbidities Comorbidity 1;Other;Comorbidity 2    Comorbidities H/o right ankle pain, h/o bilateral shoulder pain, OA, neck surgery (1991).    Examination-Activity Limitations Other;Locomotion Level    Examination-Participation Restrictions Other    Stability/Clinical Decision Making Evolving/Moderate complexity    Rehab Potential Good    PT Frequency 2x / week    PT Duration 6 weeks    PT Treatment/Interventions ADLs/Self Care Home Management;Cryotherapy;Electrical  Stimulation;Ultrasound;Moist Heat;Iontophoresis 4mg /ml Dexamethasone;Functional mobility training;Therapeutic activities;Therapeutic exercise;Manual techniques;Patient/family education;Passive range of motion;Dry needling    PT Next Visit Plan Nustep, bike, pain-free right LE ther ex.  Modalities and STW/M as needed to right distal ITB region.    Consulted and Agree with Plan of Care Patient           Patient will benefit from skilled therapeutic intervention in order to improve the following deficits and impairments:  Pain,Decreased activity tolerance,Decreased range of motion  Visit Diagnosis: Chronic pain of right knee     Problem List Patient Active Problem List   Diagnosis Date Noted  . Spondylosis, cervical, with myelopathy 09/22/2015  . Pain in joint, shoulder region, left 09/22/2015  . Hyperlipidemia 09/22/2015  . Neuropathy 09/22/2015  . Vitamin D deficiency 09/22/2015    09/24/2015, PTA 04/07/2020, 8:25 AM  [facility] [address] [place], [place], [place] Phone: [phone]   Fax:  [phone]  Name: Fernando Pratt MRN: [name] Date of Birth: 05-17-1945

## 2020-04-14 ENCOUNTER — Ambulatory Visit: Payer: PPO | Admitting: Physical Therapy

## 2020-04-14 ENCOUNTER — Other Ambulatory Visit: Payer: Self-pay

## 2020-04-14 ENCOUNTER — Encounter: Payer: Self-pay | Admitting: Physical Therapy

## 2020-04-14 DIAGNOSIS — G8929 Other chronic pain: Secondary | ICD-10-CM

## 2020-04-14 DIAGNOSIS — M25561 Pain in right knee: Secondary | ICD-10-CM

## 2020-04-14 NOTE — Therapy (Signed)
[facility] [address] [place], [place], [address] Phone: [phone]   Fax:  [phone]  Physical Therapy Treatment  Patient Details  Name: Fernando Pratt MRN: [id number] Date of Birth: 1945-07-29 Referring Provider (PT): Mechele Claude MD   Encounter Date: 04/14/2020   PT End of Session - 04/14/20 0745    Visit Number 6    Number of Visits 12    Date for PT Re-Evaluation 04/21/20    PT Start Time 0742    PT Stop Time 0815   late arrival   PT Time Calculation (min) 33 min    Activity Tolerance Patient tolerated treatment well    Behavior During Therapy [facility] for tasks assessed/performed           Past Medical History:  Diagnosis Date  . Arthritis   . Hyperlipidemia     Past Surgical History:  Procedure Laterality Date  . NECK SURGERY  1991    There were no vitals filed for this visit.   Subjective Assessment - 04/14/20 0745    Subjective COVID-19 screen performed prior to patient entering clinic. No pain today.    Pertinent History H/o right ankle pain, h/o bilateral shoulder pain, OA, neck surgery (1991).    Patient Stated Goals Decrease pain and get stronger.    Currently in Pain? No/denies              [facility] PT Assessment - 04/14/20 0001      Assessment   Medical Diagnosis Pain in right leg.    Referring Provider (PT) Mechele Claude MD      Precautions   Precautions None      Restrictions   Weight Bearing Restrictions No                         [facility] Adult PT Treatment/Exercise - 04/14/20 0001      Knee/Hip Exercises: Stretches   Active Hamstring Stretch Right;5 reps;10 seconds      Knee/Hip Exercises: Aerobic   Recumbent Bike L4 x10 min      Knee/Hip Exercises: Machines for Strengthening   Cybex Knee Extension 20# 3x10 reps    Cybex Knee Flexion 40# 3x10 reps    Cybex Leg Press 2 pl, seat 7 x30 reps      Knee/Hip Exercises: Standing   Step Down Right;3 sets;10 reps;Hand  Hold: 2;Step Height: 8"    Rocker Board 4 minutes                       PT Long Term Goals - 04/07/20 0753      PT LONG TERM GOAL #1   Title Independent with a HEP.    Time 6    Period Weeks    Status On-going      PT LONG TERM GOAL #2   Title Perform ADL's with right knee/LE pain-level not greater than a 3/10.    Time 6    Period Weeks    Status Achieved      PT LONG TERM GOAL #3   Title Walk a community distance with pain not > 3/10.    Time 6    Period Weeks    Status Achieved                 Plan - 04/14/20 8242    Clinical Impression Statement Patient presented in clinic with no current pain. Patient progressed through more strengthening especially  of R quad to assist with control during ambulation and functional activities such as squatting. No reports of pain during therex session. Patient required increased demo for step down to understand technique.    Personal Factors and Comorbidities Comorbidity 1;Other;Comorbidity 2    Comorbidities H/o right ankle pain, h/o bilateral shoulder pain, OA, neck surgery (1991).    Examination-Activity Limitations Other;Locomotion Level    Examination-Participation Restrictions Other    Stability/Clinical Decision Making Evolving/Moderate complexity    Rehab Potential Good    PT Frequency 2x / week    PT Duration 6 weeks    PT Treatment/Interventions ADLs/Self Care Home Management;Cryotherapy;Electrical Stimulation;Ultrasound;Moist Heat;Iontophoresis 4mg /ml Dexamethasone;Functional mobility training;Therapeutic activities;Therapeutic exercise;Manual techniques;Patient/family education;Passive range of motion;Dry needling    PT Next Visit Plan Nustep, bike, pain-free right LE ther ex.  Modalities and STW/M as needed to right distal ITB region.    Consulted and Agree with Plan of Care Patient           Patient will benefit from skilled therapeutic intervention in order to improve the following deficits and  impairments:  Pain,Decreased activity tolerance,Decreased range of motion  Visit Diagnosis: Chronic pain of right knee     Problem List Patient Active Problem List   Diagnosis Date Noted  . Spondylosis, cervical, with myelopathy 09/22/2015  . Pain in joint, shoulder region, left 09/22/2015  . Hyperlipidemia 09/22/2015  . Neuropathy 09/22/2015  . Vitamin D deficiency 09/22/2015    09/24/2015, PTA 04/14/2020, 8:27 AM  [facility] [address] [place], [place], [place] Phone: [phone]   Fax:  [phone]  Name: Fernando Pratt MRN: [name] Date of Birth: 02/25/1945

## 2020-04-21 ENCOUNTER — Encounter: Payer: Self-pay | Admitting: Physical Therapy

## 2020-04-21 ENCOUNTER — Ambulatory Visit: Payer: PPO | Admitting: Physical Therapy

## 2020-04-21 ENCOUNTER — Other Ambulatory Visit: Payer: Self-pay

## 2020-04-21 DIAGNOSIS — M25561 Pain in right knee: Secondary | ICD-10-CM | POA: Diagnosis not present

## 2020-04-21 DIAGNOSIS — G8929 Other chronic pain: Secondary | ICD-10-CM

## 2020-04-21 NOTE — Therapy (Signed)
[facility] [address] [place], [place], [address] Phone: [phone]   Fax:  [phone]  Physical Therapy Treatment  Patient Details  Name: Fernando Pratt MRN: [id number] Date of Birth: 1945/08/19 Referring Provider (PT): Mechele Claude MD   Encounter Date: 04/21/2020   PT End of Session - 04/21/20 0740    Visit Number 7    Number of Visits 12    Date for PT Re-Evaluation 04/21/20    PT Start Time 0735    PT Stop Time 0814    PT Time Calculation (min) 39 min    Activity Tolerance Patient tolerated treatment well    Behavior During Therapy [facility] for tasks assessed/performed           Past Medical History:  Diagnosis Date  . Arthritis   . Hyperlipidemia     Past Surgical History:  Procedure Laterality Date  . NECK SURGERY  1991    There were no vitals filed for this visit.   Subjective Assessment - 04/21/20 0740    Subjective COVID-19 screen performed prior to patient entering clinic. 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 No LBP.    Pertinent History H/o right ankle pain, h/o bilateral shoulder pain, OA, neck surgery (1991).    Patient Stated Goals Decrease pain and get stronger.    Currently in Pain? No/denies              [facility] PT Assessment - 04/21/20 0001      Assessment   Medical Diagnosis Pain in right leg.    Referring Provider (PT) Mechele Claude MD      Precautions   Precautions None      Restrictions   Weight Bearing Restrictions No                         [facility] Adult PT Treatment/Exercise - 04/21/20 0001      Knee/Hip Exercises: Aerobic   Recumbent Bike L5 x10 min, seat 5      Knee/Hip Exercises: Machines for Strengthening   Cybex Knee Extension 20# 3x10 reps    Cybex Knee Flexion 40# 3x10 reps    Cybex Leg Press 2.5 pl, seat 8 x30 reps      Knee/Hip Exercises: Standing   Forward Lunges Right;20  reps;3 seconds;Limitations    Forward Lunges Limitations 4# reachouts    Lateral Step Up Right;2 sets;10 reps;Hand Hold: 2;Step Height: 6"    Forward Step Up Right;2 sets;10 reps;Hand Hold: 2;Step Height: 8"    Step Down Right;3 sets;10 reps;Hand Hold: 2;Step Height: 6"    Wall Squat 15 reps;3 seconds      Knee/Hip Exercises: Seated   Sit to Sand 20 reps;without UE support   RLE split squat     Knee/Hip Exercises: Supine   Single Leg Bridge Strengthening;Right;2 sets;10 reps    Straight Leg Raises Strengthening;Right;2 sets;10 reps    Straight Leg Raise with External Rotation Strengthening;Right;2 sets;10 reps      Knee/Hip Exercises: Sidelying   Hip ABduction AROM;Right;2 sets;10 reps    Clams AROM R hip x20 reps                       PT Long Term Goals - 04/07/20 0753      PT LONG TERM GOAL #1   Title Independent with a HEP.  Time 6    Period Weeks    Status On-going      PT LONG TERM GOAL #2   Title Perform ADL's with right knee/LE pain-level not greater than a 3/10.    Time 6    Period Weeks    Status Achieved      PT LONG TERM GOAL #3   Title Walk a community distance with pain not > 3/10.    Time 6    Period Weeks    Status Achieved                 Plan - 04/21/20 0826    Clinical Impression Statement Patient progressed with LE strengthening without complaint of increased pain. Patient progressed through more advanced CKC therex with emphasis on quad.Some hip rotation noted with SL exercises. No complaints of pain after therex only fatigue.    Personal Factors and Comorbidities Comorbidity 1;Other;Comorbidity 2    Comorbidities H/o right ankle pain, h/o bilateral shoulder pain, OA, neck surgery (1991).    Examination-Activity Limitations Other;Locomotion Level    Examination-Participation Restrictions Other    Stability/Clinical Decision Making Evolving/Moderate complexity    Rehab Potential Good    PT Frequency 2x / week    PT Duration  6 weeks    PT Treatment/Interventions ADLs/Self Care Home Management;Cryotherapy;Electrical Stimulation;Ultrasound;Moist Heat;Iontophoresis 4mg /ml Dexamethasone;Functional mobility training;Therapeutic activities;Therapeutic exercise;Manual techniques;Patient/family education;Passive range of motion;Dry needling    PT Next Visit Plan Nustep, bike, pain-free right LE ther ex.  Modalities and STW/M as needed to right distal ITB region.    Consulted and Agree with Plan of Care Patient           Patient will benefit from skilled therapeutic intervention in order to improve the following deficits and impairments:  Pain,Decreased activity tolerance,Decreased range of motion  Visit Diagnosis: Chronic pain of right knee     Problem List Patient Active Problem List   Diagnosis Date Noted  . Spondylosis, cervical, with myelopathy 09/22/2015  . Pain in joint, shoulder region, left 09/22/2015  . Hyperlipidemia 09/22/2015  . Neuropathy 09/22/2015  . Vitamin D deficiency 09/22/2015    09/24/2015, PTA 04/21/2020, 9:39 AM  [facility] [address] [place], [place], [place] Phone: [phone]   Fax:  [phone]  Name: Fernando Pratt MRN: [name] Date of Birth: 12-24-1945

## 2020-04-28 ENCOUNTER — Ambulatory Visit: Payer: PPO | Admitting: Physical Therapy

## 2020-05-05 ENCOUNTER — Other Ambulatory Visit: Payer: Self-pay

## 2020-05-05 ENCOUNTER — Ambulatory Visit: Payer: PPO | Attending: Family Medicine | Admitting: Physical Therapy

## 2020-05-05 ENCOUNTER — Encounter: Payer: Self-pay | Admitting: Physical Therapy

## 2020-05-05 DIAGNOSIS — M25561 Pain in right knee: Secondary | ICD-10-CM | POA: Insufficient documentation

## 2020-05-05 DIAGNOSIS — G8929 Other chronic pain: Secondary | ICD-10-CM | POA: Insufficient documentation

## 2020-05-05 NOTE — Addendum Note (Signed)
Addended by: Jahlil Ziller, [place] W on: 05/05/2020 01:11 PM   Modules accepted: Orders

## 2020-05-05 NOTE — Therapy (Signed)
[facility] [address] [place], [place], [address] Phone: [phone]   Fax:  [phone]  Physical Therapy Treatment  Patient Details  Name: Fernando Pratt MRN: [id number] Date of Birth: 02/23/1945 Referring Provider (PT): Mechele Claude MD   Encounter Date: 05/05/2020   PT End of Session - 05/05/20 0754    Visit Number 8    Number of Visits 12    Date for PT Re-Evaluation 04/21/20    PT Start Time 0735    PT Stop Time 0815    PT Time Calculation (min) 40 min    Activity Tolerance Patient tolerated treatment well    Behavior During Therapy [facility] for tasks assessed/performed           Past Medical History:  Diagnosis Date  . Arthritis   . Hyperlipidemia     Past Surgical History:  Procedure Laterality Date  . NECK SURGERY  1991    There were no vitals filed for this visit.   Subjective Assessment - 05/05/20 0746    Subjective COVID-19 screen performed prior to patient entering clinic. Reports by the end of the day his R knee feels weak. Not a new symptom.    Pertinent History H/o right ankle pain, h/o bilateral shoulder pain, OA, neck surgery (1991).    Patient Stated Goals Decrease pain and get stronger.    Currently in Pain? No/denies              [facility] PT Assessment - 05/05/20 0001      Assessment   Medical Diagnosis Pain in right leg.    Referring Provider (PT) Mechele Claude MD      Precautions   Precautions None      Restrictions   Weight Bearing Restrictions No                         [facility] Adult PT Treatment/Exercise - 05/05/20 0001      Knee/Hip Exercises: Aerobic   Recumbent Bike L5 x10 min, seat 5      Knee/Hip Exercises: Machines for Strengthening   Cybex Knee Extension 20# 3x10 reps    Cybex Knee Flexion 40# 3x10 reps    Cybex Leg Press 2.5 pl, seat 8 x30 reps   RLE only     Knee/Hip Exercises: Standing   Hip Abduction Stengthening;Right;3 sets;10 reps;Knee  straight;Limitations    Abduction Limitations blue theraband    Hip Extension Stengthening;Right;3 sets;10 reps;Knee straight;Limitations    Extension Limitations blue theraband    Walking with Sports Cord 4D orange XTS x10 reps each      Knee/Hip Exercises: Seated   Sit to Sand 20 reps;without UE support   split squat RLE                      PT Long Term Goals - 04/07/20 0753      PT LONG TERM GOAL #1   Title Independent with a HEP.    Time 6    Period Weeks    Status On-going      PT LONG TERM GOAL #2   Title Perform ADL's with right knee/LE pain-level not greater than a 3/10.    Time 6    Period Weeks    Status Achieved      PT LONG TERM GOAL #3   Title Walk a community distance with pain not > 3/10.    Time 6    Period  Weeks    Status Achieved                 Plan - 05/05/20 0830    Clinical Impression Statement Patient presented in clinic with no current R knee pain but states that he notices at approximately 2-3 pm that his R knee feels fatigued. Patient denies that RLE completely feels fatigued but just R knee. Patient progressed through more hip and functional strengthening without complaint of pain.    Personal Factors and Comorbidities Comorbidity 1;Other;Comorbidity 2    Comorbidities H/o right ankle pain, h/o bilateral shoulder pain, OA, neck surgery (1991).    Examination-Activity Limitations Other;Locomotion Level    Examination-Participation Restrictions Other    Stability/Clinical Decision Making Evolving/Moderate complexity    Rehab Potential Good    PT Frequency 2x / week    PT Duration 6 weeks    PT Treatment/Interventions ADLs/Self Care Home Management;Cryotherapy;Electrical Stimulation;Ultrasound;Moist Heat;Iontophoresis 4mg /ml Dexamethasone;Functional mobility training;Therapeutic activities;Therapeutic exercise;Manual techniques;Patient/family education;Passive range of motion;Dry needling    PT Next Visit Plan Nustep, bike,  pain-free right LE ther ex.  Modalities and STW/M as needed to right distal ITB region.    Consulted and Agree with Plan of Care Patient           Patient will benefit from skilled therapeutic intervention in order to improve the following deficits and impairments:  Pain,Decreased activity tolerance,Decreased range of motion  Visit Diagnosis: Chronic pain of right knee     Problem List Patient Active Problem List   Diagnosis Date Noted  . Spondylosis, cervical, with myelopathy 09/22/2015  . Pain in joint, shoulder region, left 09/22/2015  . Hyperlipidemia 09/22/2015  . Neuropathy 09/22/2015  . Vitamin D deficiency 09/22/2015    09/24/2015, PTA 05/05/2020, 8:53 AM  [facility] [address] [place], [place], [place] Phone: [phone]   Fax:  [phone]  Name: Fernando Pratt MRN: [name] Date of Birth: [birth year]

## 2020-05-12 ENCOUNTER — Other Ambulatory Visit: Payer: Self-pay

## 2020-05-12 ENCOUNTER — Ambulatory Visit: Payer: PPO | Admitting: Physical Therapy

## 2020-05-12 ENCOUNTER — Encounter: Payer: Self-pay | Admitting: Physical Therapy

## 2020-05-12 DIAGNOSIS — M25561 Pain in right knee: Secondary | ICD-10-CM | POA: Diagnosis not present

## 2020-05-12 DIAGNOSIS — G8929 Other chronic pain: Secondary | ICD-10-CM

## 2020-05-12 NOTE — Therapy (Signed)
[facility] [address] [place], [place], [address] Phone: [phone]   Fax:  [phone]  Physical Therapy Treatment  Patient Details  Name: Fernando Pratt MRN: [id number] Date of Birth: [birth year] Referring Provider (PT): Mechele Claude MD   Encounter Date: 05/12/2020   PT End of Session - 05/12/20 0744    Visit Number 9    Number of Visits 12    Date for PT Re-Evaluation 05/19/20    PT Start Time 0741    PT Stop Time 0814    PT Time Calculation (min) 33 min    Activity Tolerance Patient tolerated treatment well    Behavior During Therapy [facility] for tasks assessed/performed           Past Medical History:  Diagnosis Date  . Arthritis   . Hyperlipidemia     Past Surgical History:  Procedure Laterality Date  . NECK SURGERY  1991    There were no vitals filed for this visit.   Subjective Assessment - 05/12/20 0743    Subjective COVID-19 screen performed prior to patient entering clinic. Reports his knee feeling a little better.    Pertinent History H/o right ankle pain, h/o bilateral shoulder pain, OA, neck surgery (1991).    Patient Stated Goals Decrease pain and get stronger.    Currently in Pain? Yes    Pain Score --   No pain score provided   Pain Location Knee    Pain Orientation Right    Pain Descriptors / Indicators Nagging    Pain Type Chronic pain    Pain Onset More than a month ago    Pain Frequency Intermittent              [facility] PT Assessment - 05/12/20 0001      Assessment   Medical Diagnosis Pain in right leg.    Referring Provider (PT) Mechele Claude MD      Precautions   Precautions None      Restrictions   Weight Bearing Restrictions No                         [facility] Adult PT Treatment/Exercise - 05/12/20 0001      Knee/Hip Exercises: Aerobic   Recumbent Bike L4 x10 min, seat 5      Knee/Hip Exercises: Machines for Strengthening   Cybex Knee Extension 20# 3x10 reps     Cybex Knee Flexion 30# 3x10 reps    Cybex Leg Press 2.5 pl, seat 8 x40 reps      Knee/Hip Exercises: Standing   Heel Raises Both;20 reps   eccentric off 2" step   Heel Raises Limitations B toe raise x20 reps   eccentric off 2" step   Forward Lunges Right;20 reps;3 seconds    Forward Lunges Limitations 10# kettlebell    Hip Abduction Stengthening;Right;3 sets;10 reps;Knee straight;Limitations    Abduction Limitations blue theraband    Hip Extension Stengthening;Right;3 sets;10 reps;Knee straight;Limitations    Extension Limitations blue theraband    Other Standing Knee Exercises SL mini squat x20 reps                       PT Long Term Goals - 04/07/20 0753      PT LONG TERM GOAL #1   Title Independent with a HEP.    Time 6    Period Weeks    Status On-going      PT  LONG TERM GOAL #2   Title Perform ADL's with right knee/LE pain-level not greater than a 3/10.    Time 6    Period Weeks    Status Achieved      PT LONG TERM GOAL #3   Title Walk a community distance with pain not > 3/10.    Time 6    Period Weeks    Status Achieved                 Plan - 05/12/20 0830    Clinical Impression Statement Patient presented in clinic with reports of improvement of R knee pain. Patient progressed through more resistive strengthening without complaint of pain. Patient progressed through mini squat and lunge for end range strengthening.    Personal Factors and Comorbidities Comorbidity 1;Other;Comorbidity 2    Comorbidities H/o right ankle pain, h/o bilateral shoulder pain, OA, neck surgery (1991).    Examination-Activity Limitations Other;Locomotion Level    Examination-Participation Restrictions Other    Stability/Clinical Decision Making Evolving/Moderate complexity    Rehab Potential Good    PT Frequency 2x / week    PT Duration 6 weeks    PT Treatment/Interventions ADLs/Self Care Home Management;Cryotherapy;Electrical Stimulation;Ultrasound;Moist  Heat;Iontophoresis 4mg /ml Dexamethasone;Functional mobility training;Therapeutic activities;Therapeutic exercise;Manual techniques;Patient/family education;Passive range of motion;Dry needling    PT Next Visit Plan Nustep, bike, pain-free right LE ther ex.  Modalities and STW/M as needed to right distal ITB region.    Consulted and Agree with Plan of Care Patient           Patient will benefit from skilled therapeutic intervention in order to improve the following deficits and impairments:  Pain,Decreased activity tolerance,Decreased range of motion  Visit Diagnosis: Chronic pain of right knee     Problem List Patient Active Problem List   Diagnosis Date Noted  . Spondylosis, cervical, with myelopathy 09/22/2015  . Pain in joint, shoulder region, left 09/22/2015  . Hyperlipidemia 09/22/2015  . Neuropathy 09/22/2015  . Vitamin D deficiency 09/22/2015    09/24/2015, PTA 05/12/2020, 9:07 AM  [facility] [address] [place], [place], [place] Phone: [phone]   Fax:  [phone]  Name: DARROLD BEZEK MRN: [name] Date of Birth: Oct 15, 1945

## 2020-05-19 ENCOUNTER — Ambulatory Visit: Payer: PPO | Admitting: Physical Therapy

## 2020-05-26 ENCOUNTER — Ambulatory Visit: Payer: PPO

## 2020-05-26 ENCOUNTER — Other Ambulatory Visit: Payer: Self-pay

## 2020-05-26 DIAGNOSIS — G8929 Other chronic pain: Secondary | ICD-10-CM

## 2020-05-26 DIAGNOSIS — M25561 Pain in right knee: Secondary | ICD-10-CM | POA: Diagnosis not present

## 2020-05-26 NOTE — Therapy (Addendum)
[facility] [address] [place], [place], [address] Phone: [phone]   Fax:  [phone]  Physical Therapy Treatment  Patient Details  Name: Fernando Pratt MRN: [id number] Date of Birth: 1945/02/02 Referring Provider (PT): Mechele Claude MD   Encounter Date: 05/26/2020   PT End of Session - 05/26/20 0820     Visit Number 10    Number of Visits 12    Date for PT Re-Evaluation 06/09/20             Past Medical History:  Diagnosis Date   Arthritis    Hyperlipidemia     Past Surgical History:  Procedure Laterality Date   NECK SURGERY  1991    There were no vitals filed for this visit.   Subjective Assessment - 05/26/20 0737     Subjective COVID-19 screen performed prior to patient entering clinic. Patient reports that he tried running the other day and his knee felt as though it was going to collapse. Patient states that he has been having ain in his R hip for a while too and wonders if they are connected.    Pertinent History H/o right ankle pain, h/o bilateral shoulder pain, OA, neck surgery (1991).    Limitations Walking;Other (comment)   running   Currently in Pain? No/denies    Pain Location Knee    Pain Orientation Right    Pain Type Chronic pain    Pain Onset More than a month ago    Pain Frequency Intermittent                               [facility] Adult PT Treatment/Exercise - 05/26/20 0731       Therapeutic Activites    Therapeutic Activities Other Therapeutic Activities    Other Therapeutic Activities Education on proximal hip and knee strength association, review HEP      Exercises   Exercises Knee/Hip      Knee/Hip Exercises: Stretches   Active Hamstring Stretch Right;10 seconds;3 reps   foot propped onto 14" of box   ITB Stretch Right;2 reps;30 seconds    Piriformis Stretch 3 reps;20 seconds   seated fig 4   Other Knee/Hip Stretches Incline board calf stretch bil ; 3 sets 20  sec holds      Knee/Hip Exercises: Aerobic   Recumbent Bike L5 x seat on 5      Knee/Hip Exercises: Plyometrics   Bilateral Jumping 2 sets;10 reps    Bilateral Jumping Limitations wall taps to low ceiling beam, difficulty hopping off of ground but ale to complete after multiple attempts      Knee/Hip Exercises: Standing   Heel Raises Both;20 reps   "up on two down on one"   Hip Abduction Stengthening;Both;2 sets;Other (comment)   10'   Abduction Limitations sidestepping with green tband - 2 x 30'; added 10# at heart center for 1 lap      Knee/Hip Exercises: Seated   Sit to Sand 2 sets;10 reps;without UE support;Other (comment)   fete staggered progressing to single leg sit to stand     Knee/Hip Exercises: Supine   Other Supine Knee/Hip Exercises 3 x 1 min ; long sitting external rotation against green tband      Manual Therapy   Manual Therapy Joint mobilization;Soft tissue mobilization    Manual therapy comments Improved mobility of lateral tissues, decreased hamstring discomfort    Joint Mobilization seated  inferior distraction    Soft tissue mobilization hamstring STM                         PT Long Term Goals - 04/07/20 0753       PT LONG TERM GOAL #1   Title Independent with a HEP.    Time 6    Period Weeks    Status On-going      PT LONG TERM GOAL #2   Title Perform ADL's with right knee/LE pain-level not greater than a 3/10.    Time 6    Period Weeks    Status Achieved      PT LONG TERM GOAL #3   Title Walk a community distance with pain not > 3/10.    Time 6    Period Weeks    Status Achieved                    Patient will benefit from skilled therapeutic intervention in order to improve the following deficits and impairments:     Visit Diagnosis: No diagnosis found.     Problem List Patient Active Problem List   Diagnosis Date Noted   Spondylosis, cervical, with myelopathy 09/22/2015   Pain in joint, shoulder  region, left 09/22/2015   Hyperlipidemia 09/22/2015   Neuropathy 09/22/2015   Vitamin D deficiency 09/22/2015    Progress Note Reporting Period 03/10/2020 to 06/09/2020  See note below for Objective Data and Assessment of Progress/Goals.   Patient making excellent progress towards goals with LTG 1 and 2 achieved at this time. Pt continues to demonstrate limited R hip strength contributing to his complaints of chronic R knee pain.     Levonne Spiller PT, DPT 05/26/2020, 8:33 AM  [facility] [address] [place], [place], [address] Phone: [phone]   Fax:  [phone]  Name: Fernando Pratt MRN: [id number] Date of Birth: [birth year]

## 2020-06-02 ENCOUNTER — Ambulatory Visit: Payer: PPO | Admitting: Physical Therapy

## 2020-12-22 ENCOUNTER — Telehealth: Payer: Self-pay | Admitting: Family Medicine

## 2020-12-22 NOTE — Telephone Encounter (Signed)
Left message for patient to call back and schedule Medicare Annual Wellness Visit (AWV) to be completed by video or phone.  ? ?Last AWV: 03/27/2019 ? ?Please schedule at anytime with [facility] Nurse Health Advisor   Mary Jane ? ?45 minute appointment ? ?Any questions, please contact me at [phone]  ?  ?  ?

## 2021-01-18 ENCOUNTER — Telehealth: Payer: Self-pay | Admitting: Family Medicine

## 2021-01-18 NOTE — Telephone Encounter (Signed)
Left message for patient to call back and schedule Medicare Annual Wellness Visit (AWV) to be completed by video or phone.  ? ?Last AWV: 03/27/2019 ? ?Please schedule at anytime with [facility] Nurse Health Advisor   Mary Jane ? ?45 minute appointment ? ?Any questions, please contact me at [phone]  ?  ?  ?

## 2021-02-09 ENCOUNTER — Telehealth: Payer: Self-pay | Admitting: Family Medicine

## 2021-02-09 NOTE — Telephone Encounter (Signed)
Left message for patient to call back and schedule Medicare Annual Wellness Visit (AWV) to be completed by video or phone.  ? ?Last AWV: 03/27/2019 ? ?Please schedule at anytime with [facility] Nurse Health Advisor   Mary Jane ? ?45 minute appointment ? ?Any questions, please contact me at [phone]  ?  ?  ?

## 2021-02-12 ENCOUNTER — Other Ambulatory Visit: Payer: Self-pay | Admitting: *Deleted

## 2021-02-12 ENCOUNTER — Other Ambulatory Visit: Payer: PPO

## 2021-02-12 DIAGNOSIS — Z125 Encounter for screening for malignant neoplasm of prostate: Secondary | ICD-10-CM

## 2021-02-12 DIAGNOSIS — E785 Hyperlipidemia, unspecified: Secondary | ICD-10-CM | POA: Diagnosis not present

## 2021-02-12 LAB — LIPID PANEL

## 2021-02-13 LAB — CMP14+EGFR
ALT: 17 IU/L (ref 0–44)
AST: 23 IU/L (ref 0–40)
Albumin/Globulin Ratio: 2.2 (ref 1.2–2.2)
Albumin: 4.2 g/dL (ref 3.7–4.7)
Alkaline Phosphatase: 83 IU/L (ref 44–121)
BUN/Creatinine Ratio: 21 (ref 10–24)
BUN: 25 mg/dL (ref 8–27)
Bilirubin Total: 0.7 mg/dL (ref 0.0–1.2)
CO2: 21 mmol/L (ref 20–29)
Calcium: 8.6 mg/dL (ref 8.6–10.2)
Chloride: 106 mmol/L (ref 96–106)
Creatinine, Ser: 1.19 mg/dL (ref 0.76–1.27)
Globulin, Total: 1.9 g/dL (ref 1.5–4.5)
Glucose: 104 mg/dL — ABNORMAL HIGH (ref 70–99)
Potassium: 4.4 mmol/L (ref 3.5–5.2)
Sodium: 144 mmol/L (ref 134–144)
Total Protein: 6.1 g/dL (ref 6.0–8.5)
eGFR: 64 mL/min/{1.73_m2} (ref >59)

## 2021-02-13 LAB — CBC WITH DIFFERENTIAL/PLATELET
Basophils Absolute: 0.1 10*3/uL (ref 0.0–0.2)
Basos: 1 %
EOS (ABSOLUTE): 0.2 10*3/uL (ref 0.0–0.4)
Eos: 3 %
Hematocrit: 41.5 % (ref 37.5–51.0)
Hemoglobin: 14.1 g/dL (ref 13.0–17.7)
Immature Grans (Abs): 0 10*3/uL (ref 0.0–0.1)
Immature Granulocytes: 0 %
Lymphocytes Absolute: 1.3 10*3/uL (ref 0.7–3.1)
Lymphs: 22 %
MCH: 30.5 pg (ref 26.6–33.0)
MCHC: 34 g/dL (ref 31.5–35.7)
MCV: 90 fL (ref 79–97)
Monocytes Absolute: 0.5 10*3/uL (ref 0.1–0.9)
Monocytes: 9 %
Neutrophils Absolute: 3.8 10*3/uL (ref 1.4–7.0)
Neutrophils: 65 %
Platelets: 130 10*3/uL — ABNORMAL LOW (ref 150–450)
RBC: 4.63 x10E6/uL (ref 4.14–5.80)
RDW: 12.3 % (ref 11.6–15.4)
WBC: 5.8 10*3/uL (ref 3.4–10.8)

## 2021-02-13 LAB — LIPID PANEL
Chol/HDL Ratio: 5.1 ratio — ABNORMAL HIGH (ref 0.0–5.0)
Cholesterol, Total: 209 mg/dL — ABNORMAL HIGH (ref 100–199)
HDL: 41 mg/dL (ref >39)
LDL Chol Calc (NIH): 137 mg/dL — ABNORMAL HIGH (ref 0–99)
Triglycerides: 172 mg/dL — ABNORMAL HIGH (ref 0–149)
VLDL Cholesterol Cal: 31 mg/dL (ref 5–40)

## 2021-02-13 LAB — PSA, TOTAL AND FREE
PSA, Free Pct: 44 %
PSA, Free: 0.22 ng/mL
Prostate Specific Ag, Serum: 0.5 ng/mL (ref 0.0–4.0)

## 2021-02-15 ENCOUNTER — Encounter: Payer: Self-pay | Admitting: Family Medicine

## 2021-02-15 ENCOUNTER — Ambulatory Visit: Payer: PPO | Admitting: Nurse Practitioner

## 2021-08-25 IMAGING — US US SCROTUM W/ DOPPLER COMPLETE
1 series · 14 of 25 positions shown · non-contrast
Comparison: None.

CLINICAL DATA: Testicular swelling times 4-5 months.

EXAM:
SCROTAL ULTRASOUND
DOPPLER ULTRASOUND OF THE TESTICLES
TECHNIQUE: Complete ultrasound examination of the testicles, epididymis, and
other scrotal structures was performed. Color and spectral Doppler
ultrasound were also utilized to evaluate blood flow to the
testicles.

[Series 1: us scrotum w/doppler · 14 of 64 slices shown]
[im 1/64]
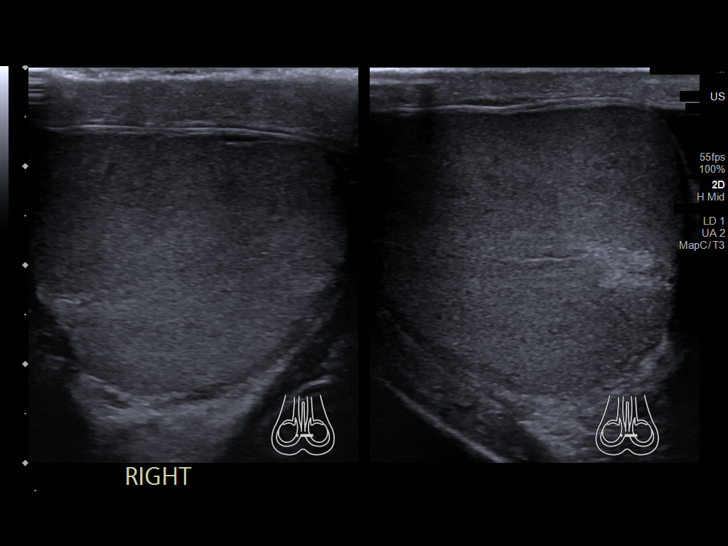
[im 6/64]
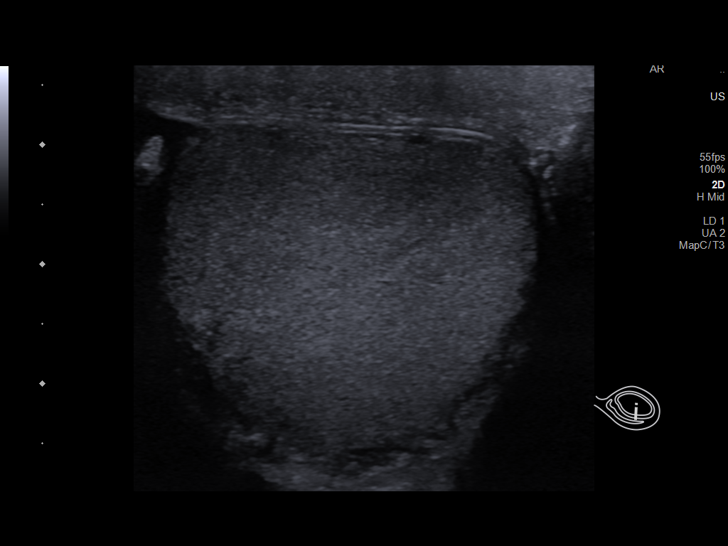
[im 11/64]
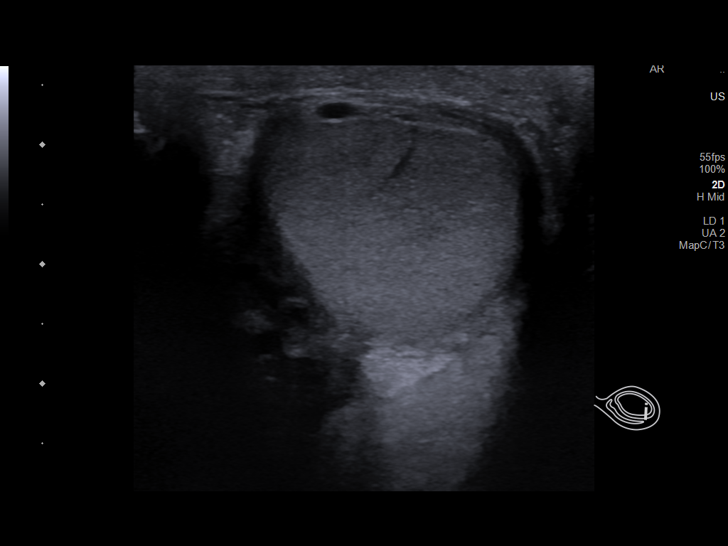
[im 16/64]
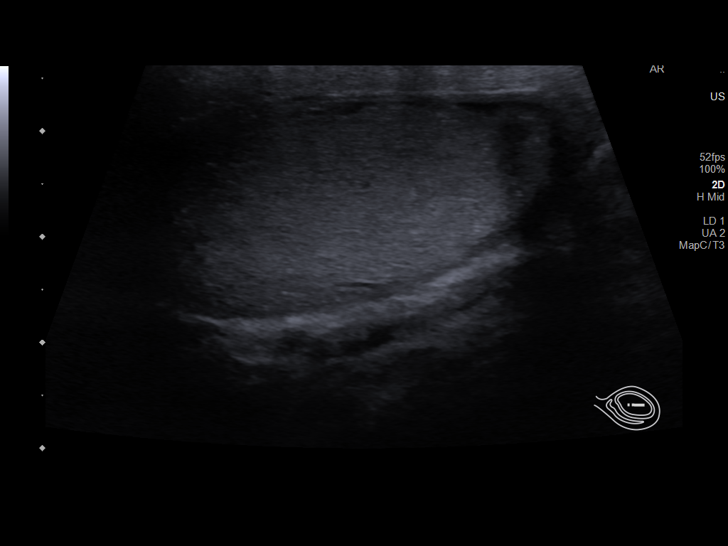
[im 22/64]
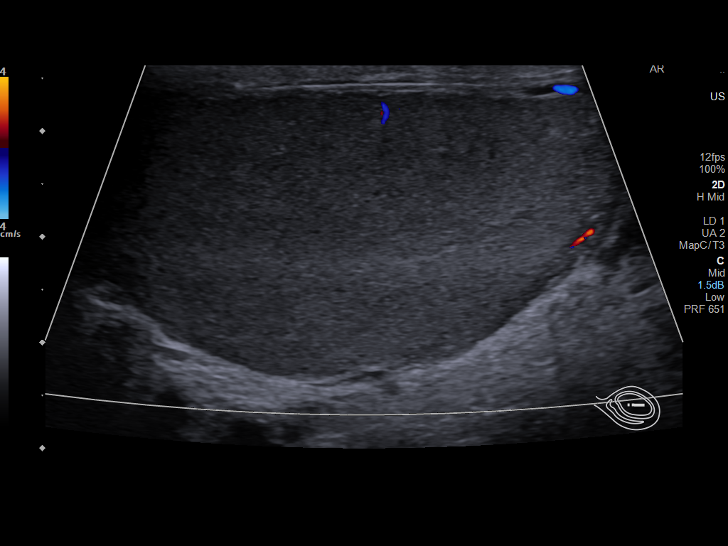
[im 24/64]
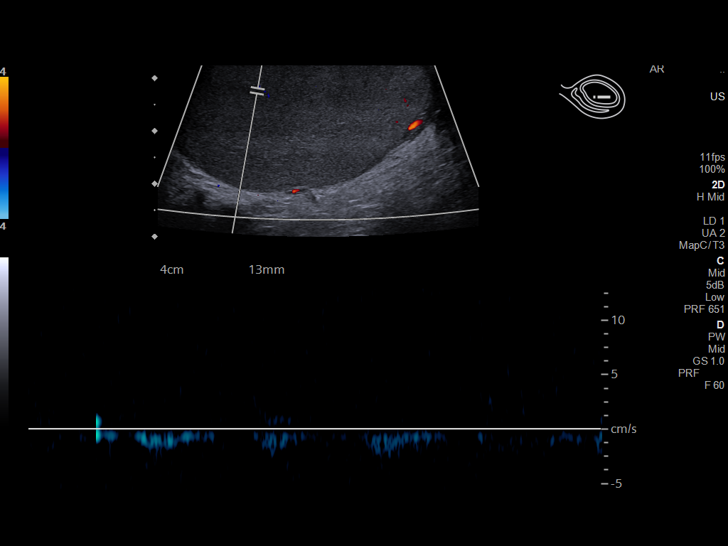
[im 29/64]
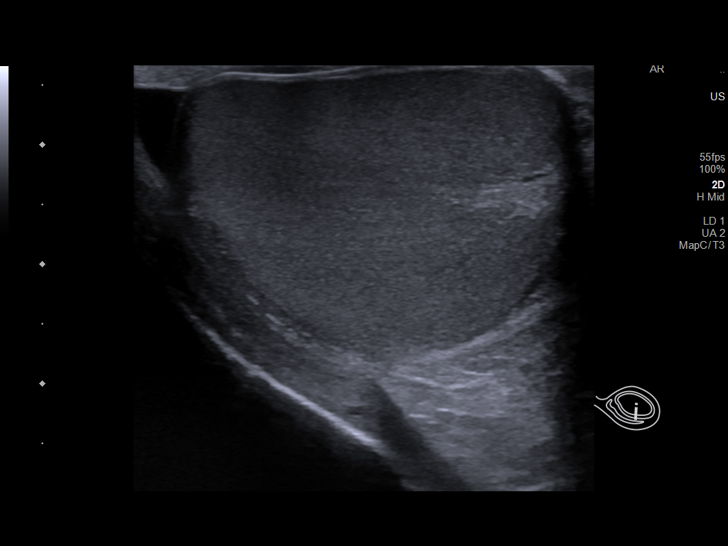
[im 35/64]
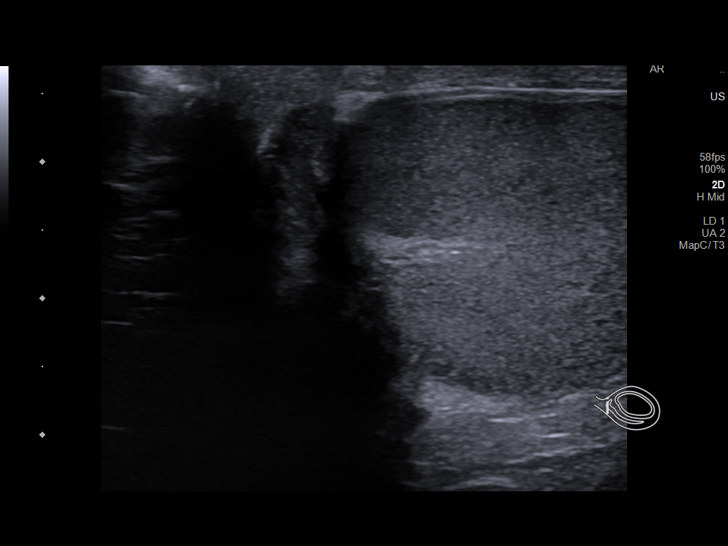
[im 40/64]
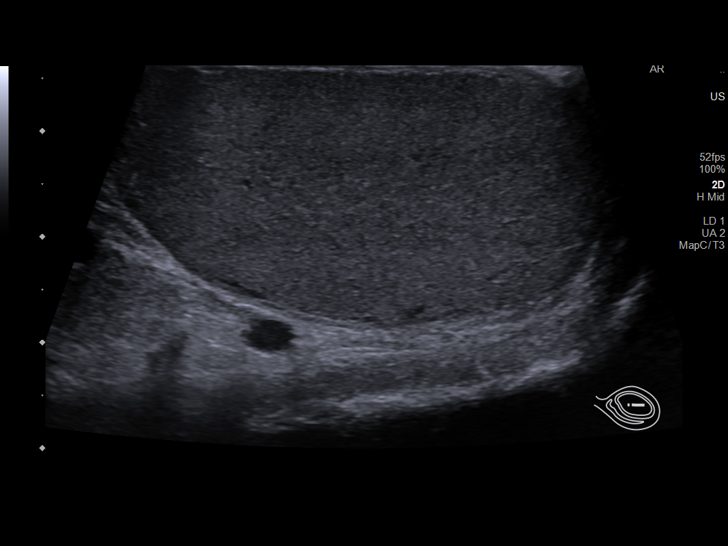
[im 43/64]
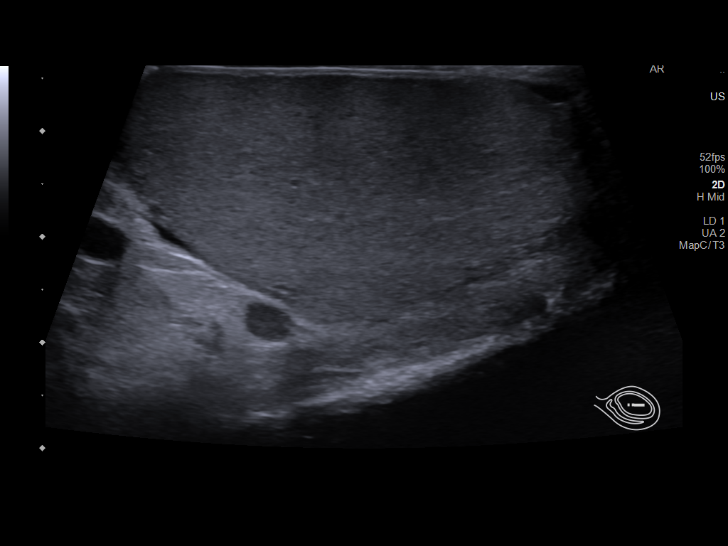
[im 48/64]
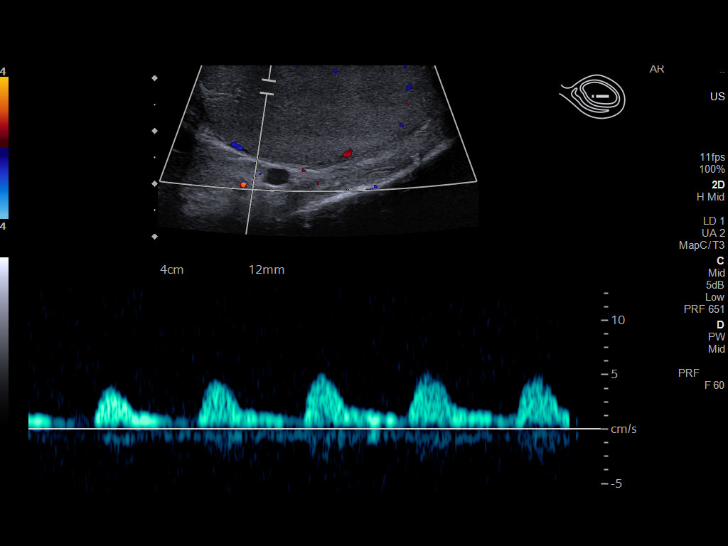
[im 53/64]
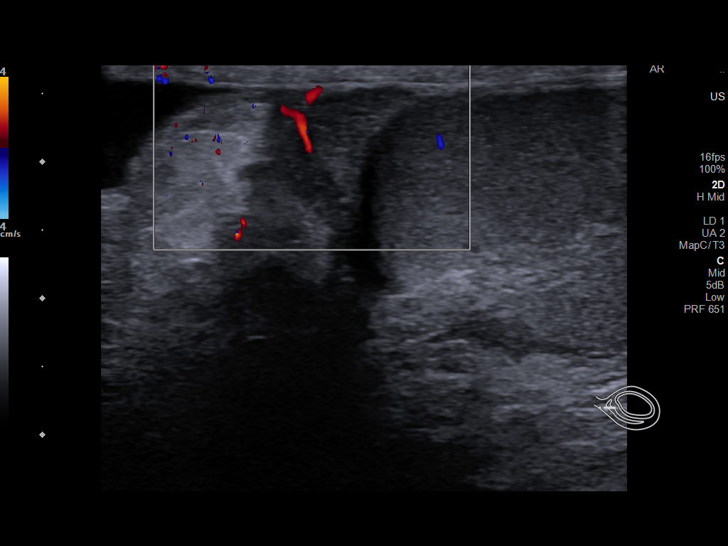
[im 58/64]
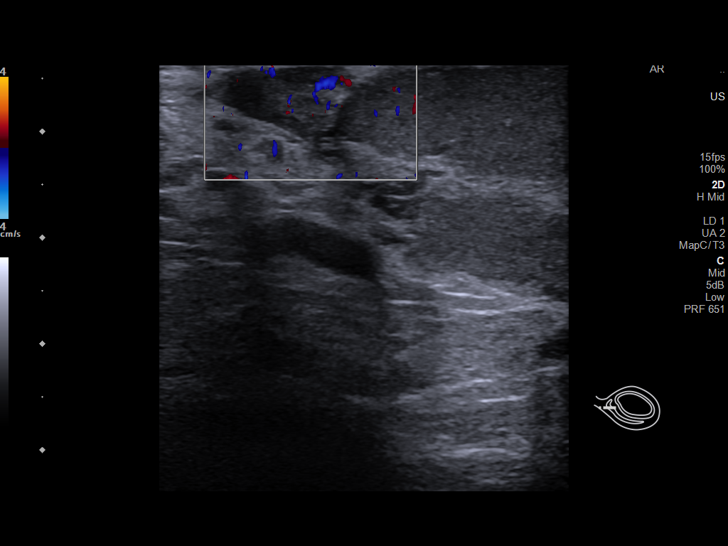
[im 64/64]
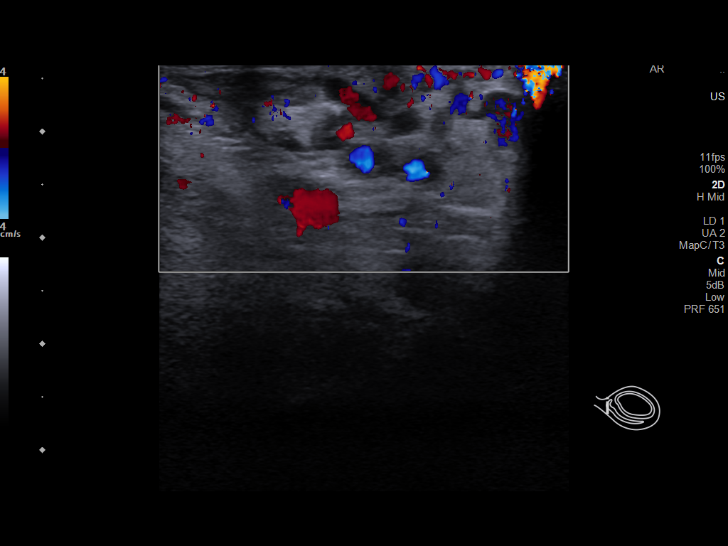

[14 of 25 positions shown; findings below may reference images not displayed]

FINDINGS: Right testicle

Measurements: 5.3 x 3.3 x 2.8 cm for a volume of 26 cc. No mass or
microlithiasis visualized.

Left testicle

Measurements: 4.9 x 3.2 x 2.6 cm for a volume of 21 cc. No mass or
microlithiasis visualized.

Right epididymis:  Normal in size and appearance.

Left epididymis:  Normal in size and appearance.

Hydrocele:  Very small bilateral hydroceles.

Varicocele:  None visualized.

Pulsed Doppler interrogation of both testes demonstrates normal low
resistance arterial and venous waveforms bilaterally.
IMPRESSION: Very small bilateral hydroceles, otherwise unremarkable examination.

## 2022-04-19 ENCOUNTER — Ambulatory Visit (INDEPENDENT_AMBULATORY_CARE_PROVIDER_SITE_OTHER): Payer: PPO | Admitting: Family Medicine

## 2022-04-19 ENCOUNTER — Encounter: Payer: Self-pay | Admitting: Family Medicine

## 2022-04-19 ENCOUNTER — Ambulatory Visit (INDEPENDENT_AMBULATORY_CARE_PROVIDER_SITE_OTHER): Payer: PPO

## 2022-04-19 VITALS — BP 129/77 | HR 84 | Temp 97.2°F | Ht 71.0 in | Wt 239.0 lb

## 2022-04-19 DIAGNOSIS — G8929 Other chronic pain: Secondary | ICD-10-CM

## 2022-04-19 DIAGNOSIS — M25561 Pain in right knee: Secondary | ICD-10-CM | POA: Diagnosis not present

## 2022-04-19 DIAGNOSIS — M1711 Unilateral primary osteoarthritis, right knee: Secondary | ICD-10-CM | POA: Diagnosis not present

## 2022-04-19 MED ORDER — CELECOXIB 200 MG PO CAPS: 200.0000 mg | ORAL_CAPSULE | Freq: Every day | ORAL | 5 refills | Status: DC

## 2022-04-19 NOTE — Progress Notes (Signed)
Chief Complaint  Patient presents with   Knee Pain    Right     HPI  Patient presents today for INCREASING PAIN IN THE RIGHT KNEE. There has been pain ongoing, but recently increasing. Causing the muscles to lock up and make it difficult to move the leg.Feels weak at times. Careful, but not decreasing activities. Points to lateral joint line for primary source of pain. Additionally, there is posterior pain. Mild discomfort left knee to.   PMH: Smoking status noted ROS: Per HPI  Objective: BP 129/77   Pulse 84   Temp (!) 97.2 F (36.2 C)   Ht  (1.803 m)   Wt 239 lb (108.4 kg)   SpO2 96%   BMI 33.33 kg/m  Gen: NAD, alert, cooperative with exam HEENT: NCAT, EOMI, PERRL CV: RRR, good S1/S2, no murmur Resp: CTABL, no wheezes, non-labored Ext: No edema, warm. Tender at lateral right knee for palpation at joint line. No edema. There is a posterior Baker's cyst. FROM. McMurray is equivocal, but collateral stress and Lachman's are normal.  Neuro: Alert and oriented, No gross deficits XR report shows arthritic change, but also a lucent line and periosteal reaction. Assessment and plan:  1. Chronic pain of right knee     Meds ordered this encounter  Medications   celecoxib (CELEBREX) 200 MG capsule    Sig: Take 1 capsule (200 mg total) by mouth daily. With food    Dispense:  30 capsule    Refill:  5   After obtaining informed consent, A steroid injection was performed at the right knee. The lateral compartment was cleansed with betadine. With 1.5 inch, 23 gauge needle using 2.5 ml marcan and 9 mg of Celestone the joint space was entered and injected. This was well tolerated. Simple dressing applied. Follow up as needed.  Mechele Claude, MD

## 2022-05-04 ENCOUNTER — Telehealth: Payer: Self-pay | Admitting: Family Medicine

## 2022-05-04 NOTE — Telephone Encounter (Signed)
Called patient to schedule Medicare Annual Wellness Visit (AWV). Left message for patient to call back and schedule Medicare Annual Wellness Visit (AWV).  Last date of AWV: 03/27/2019   Please schedule an appointment at any time with Abby, NHA. .  If any questions, please contact me at [phone].  Thank you,  Judeth Cornfield,  AMB Clinical Support [facility] AWV Program Direct Dial ??[phone]

## 2022-07-27 DIAGNOSIS — M25561 Pain in right knee: Secondary | ICD-10-CM | POA: Diagnosis not present

## 2022-11-29 ENCOUNTER — Ambulatory Visit: Payer: PPO | Admitting: Family Medicine

## 2022-11-29 ENCOUNTER — Encounter: Payer: Self-pay | Admitting: Family Medicine

## 2022-11-29 VITALS — BP 135/72 | HR 75 | Temp 97.7°F | Ht 71.0 in | Wt 244.0 lb

## 2022-11-29 DIAGNOSIS — R002 Palpitations: Secondary | ICD-10-CM | POA: Diagnosis not present

## 2022-11-29 DIAGNOSIS — M1710 Unilateral primary osteoarthritis, unspecified knee: Secondary | ICD-10-CM

## 2022-11-29 MED ORDER — CELECOXIB 400 MG PO CAPS: 400.0000 mg | ORAL_CAPSULE | Freq: Every day | ORAL | 3 refills | Status: AC

## 2022-11-29 NOTE — Progress Notes (Signed)
Subjective:  Patient ID: Fernando Pratt, male    DOB: 30-Mar-1945  Age: 77 y.o. MRN: [id number]  CC: Knee Pain (Right ) and Palpitations   HPI Fernando Pratt presents for right knee pain with going down steps. 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 Pain slows him down but Fernando Pratt is able to continue tasks in spite of the pain.    Reports several brief episodes of palpitations. Substernal buzzing with them. Lasting a few moments only. No dyspnea, dizziness, syncope. No radiation. No activity association noted.        11/29/2022    9:27 AM 04/19/2022    9:37 AM 03/04/2020   12:56 PM  Depression screen PHQ 2/9  Decreased Interest 0 0 0  Down, Depressed, Hopeless 0 0 0  PHQ - 2 Score 0 0 0    History Fernando Pratt has a past medical history of Arthritis and Hyperlipidemia.   Fernando Pratt has a past surgical history that includes Neck surgery (1991).   His family history includes Arthritis in his mother; Cancer in his mother; Healthy in his son, son, and son.Fernando Pratt reports that Fernando Pratt has quit smoking. His smoking use included cigars. Fernando Pratt has never used smokeless tobacco. Fernando Pratt reports that Fernando Pratt does not drink alcohol and does not use drugs.    ROS Review of Systems  Constitutional:  Negative for fever.  Respiratory:  Negative for shortness of breath.   Cardiovascular:  Positive for chest pain and palpitations.  Musculoskeletal:  Negative for arthralgias.  Skin:  Negative for rash.    Objective:  BP 135/72   Pulse 75   Temp 97.7 F (36.5 C)   Ht 5\' 11"  (1.803 m)   Wt 244 lb (110.7 kg)   SpO2 95%   BMI 34.03 kg/m   BP Readings from Last 3 Encounters:  11/29/22 135/72  04/19/22 129/77  03/04/20 127/80    Wt Readings from Last 3 Encounters:  11/29/22 244 lb (110.7 kg)  04/19/22 239 lb (108.4 kg)  03/04/20 244 lb 9.6 oz (110.9 kg)     Physical Exam Vitals reviewed.  Constitutional:      Appearance: Fernando Pratt is well-developed.  HENT:     Head: Normocephalic and  atraumatic.     Right Ear: External ear normal.     Left Ear: External ear normal.     Mouth/Throat:     Pharynx: No oropharyngeal exudate or posterior oropharyngeal erythema.  Eyes:     Pupils: Pupils are equal, round, and reactive to light.  Cardiovascular:     Rate and Rhythm: Normal rate and regular rhythm.     Heart sounds: No murmur heard. Pulmonary:     Effort: No respiratory distress.     Breath sounds: Normal breath sounds.  Musculoskeletal:        General: Tenderness (aanterior knee.) present. No deformity (stable for McMUrray, Lachman and collateral stress).     Cervical back: Normal range of motion and neck supple.  Neurological:     Mental Status: Fernando Pratt is alert and oriented to person, place, and time.    EKG: NSR no ischemic changes. Nml axess, intervals and segments.   Assessment & Plan:   Fernando Pratt was seen today for knee pain and palpitations.  Diagnoses and all orders for this visit:  Palpitation -     EKG 12-Lead  Arthritis of knee -     MR Knee Right Wo Contrast; Future  Other orders -  celecoxib (CELEBREX) 400 MG capsule; Take 1 capsule (400 mg total) by mouth daily. With food       I have changed Giles B. Bloomfield's celecoxib.  Allergies as of 11/29/2022   No Known Allergies      Medication List        Accurate as of November 29, 2022  7:37 PM. If you have any questions, ask your nurse or doctor.          celecoxib 400 MG capsule Commonly known as: CeleBREX Take 1 capsule (400 mg total) by mouth daily. With food What changed:  medication strength how much to take Changed by: Broadus John Darbi Chandran         Follow-up: Return in about 6 months (around 05/29/2023).  Mechele Claude, M.D.

## 2022-12-05 ENCOUNTER — Telehealth: Payer: Self-pay | Admitting: Family Medicine

## 2022-12-05 NOTE — Telephone Encounter (Unsigned)
Copied from CRM [id number]. Topic: Referral - Request for Referral >> Dec 05, 2022  3:42 PM Larwance Sachs wrote: Reason for CRM: Patient called in regarding scheduling MRI Call back at [phone]

## 2022-12-07 ENCOUNTER — Ambulatory Visit (HOSPITAL_COMMUNITY)
Admission: RE | Admit: 2022-12-07 | Discharge: 2022-12-07 | Disposition: A | Discharge status: Home / Self Care | Payer: PPO | Source: Ambulatory Visit | Attending: Family Medicine | Admitting: Family Medicine

## 2022-12-07 DIAGNOSIS — S83231A Complex tear of medial meniscus, current injury, right knee, initial encounter: Secondary | ICD-10-CM | POA: Diagnosis not present

## 2022-12-07 DIAGNOSIS — M1711 Unilateral primary osteoarthritis, right knee: Secondary | ICD-10-CM | POA: Diagnosis not present

## 2022-12-07 DIAGNOSIS — M25461 Effusion, right knee: Secondary | ICD-10-CM | POA: Diagnosis not present

## 2022-12-07 DIAGNOSIS — M1710 Unilateral primary osteoarthritis, unspecified knee: Secondary | ICD-10-CM | POA: Insufficient documentation

## 2022-12-07 DIAGNOSIS — S83271A Complex tear of lateral meniscus, current injury, right knee, initial encounter: Secondary | ICD-10-CM | POA: Diagnosis not present

## 2022-12-07 NOTE — Telephone Encounter (Signed)
R/C to Patient - Patient is aware of Appt information.

## 2022-12-15 ENCOUNTER — Telehealth: Payer: Self-pay | Admitting: Family Medicine

## 2022-12-15 NOTE — Telephone Encounter (Signed)
Copied from CRM [id number]. Topic: Clinical - Request for Lab/Test Order >> Dec 15, 2022 11:29 AM Fernando Pratt wrote: Reason for CRM: pt is calling in to talk to the provider about his test results

## 2022-12-15 NOTE — Telephone Encounter (Signed)
Informed pt MRI results are not back yet but we will call when they are. Pt verbalized understanding

## 2022-12-25 ENCOUNTER — Other Ambulatory Visit: Payer: Self-pay | Admitting: Family Medicine

## 2022-12-25 DIAGNOSIS — M1710 Unilateral primary osteoarthritis, unspecified knee: Secondary | ICD-10-CM

## 2023-01-07 DIAGNOSIS — H40033 Anatomical narrow angle, bilateral: Secondary | ICD-10-CM | POA: Diagnosis not present

## 2023-01-07 DIAGNOSIS — H2513 Age-related nuclear cataract, bilateral: Secondary | ICD-10-CM | POA: Diagnosis not present

## 2023-02-06 ENCOUNTER — Ambulatory Visit: Payer: PPO | Admitting: Orthopaedic Surgery

## 2023-02-06 ENCOUNTER — Other Ambulatory Visit (INDEPENDENT_AMBULATORY_CARE_PROVIDER_SITE_OTHER): Payer: Self-pay

## 2023-02-06 ENCOUNTER — Encounter: Payer: Self-pay | Admitting: Orthopaedic Surgery

## 2023-02-06 DIAGNOSIS — M25561 Pain in right knee: Secondary | ICD-10-CM | POA: Diagnosis not present

## 2023-02-06 DIAGNOSIS — M1711 Unilateral primary osteoarthritis, right knee: Secondary | ICD-10-CM

## 2023-02-06 DIAGNOSIS — M79604 Pain in right leg: Secondary | ICD-10-CM

## 2023-02-06 DIAGNOSIS — G8929 Other chronic pain: Secondary | ICD-10-CM | POA: Diagnosis not present

## 2023-02-06 NOTE — Progress Notes (Signed)
The patient is very pleasant 78 year old gentleman who was sent to me for evaluation treatment of right knee arthritis.  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  I was able to review the MRI of his knee but also saw plain films of the knee showing the distal femur as well from April 2024.  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  However there was no follow-up films and the MRI did not show the lesion.  The patient does report thigh pain but it does not wake him up at night.  Most of his pain seems to be around the right trochanteric area of the hip but also the lateral aspect of the right knee.  On exam there is no pain when I stressed the thigh but there is some pain with rotation of the right hip but not in the groin.  Is more on the trochanteric area of the hip but also the IT band.  He has varus malalignment of that knee with no effusion.  There is no significant patellofemoral crepitation but there is pain in the posterior aspect of the knee.  I did go over all imaging studies with him.  I was able to get new films of the femur today on the right side and compare that to previous films.  I do not see any significant change in the bone when compared to films from 9 months ago.  This point we will order hyaluronic acid to treat the pain from arthritis in his right knee given the fact that he has had steroid injections that have not worked.  We also need to obtain a MRI of his right femur to assess the lesion and bone further.  The patient has requested this as well and I agree based on what he is describing and the nature of the plain films.  We will see him back for hopefully placing a hyaluronic acid injection in his right  knee to treat the pain from osteoarthritis and to go over the MRI findings of his right femur.  This patient is diagnosed with osteoarthritis of the knee(s).    Radiographs show evidence of joint space narrowing, osteophytes, subchondral sclerosis and/or subchondral cysts.  This patient has knee pain which interferes with functional and activities of daily living.    This patient has experienced inadequate response, adverse effects and/or intolerance with conservative treatments such as acetaminophen, NSAIDS, topical creams, physical therapy or regular exercise, knee bracing and/or weight loss.   This patient has experienced inadequate response or has a contraindication to intra articular steroid injections for at least 3 months.   This patient is not scheduled to have a total knee replacement within 6 months of starting treatment with viscosupplementation.

## 2023-02-07 ENCOUNTER — Other Ambulatory Visit: Payer: Self-pay

## 2023-02-07 ENCOUNTER — Telehealth: Payer: Self-pay

## 2023-02-07 DIAGNOSIS — M79604 Pain in right leg: Secondary | ICD-10-CM

## 2023-02-07 DIAGNOSIS — M899 Disorder of bone, unspecified: Secondary | ICD-10-CM

## 2023-02-07 NOTE — Telephone Encounter (Signed)
 Right knee gel injection

## 2023-02-10 ENCOUNTER — Telehealth: Payer: Self-pay | Admitting: Orthopaedic Surgery

## 2023-02-10 NOTE — Telephone Encounter (Signed)
 Called patient to schedule mri review with Dr. Lucienne Ryder. Left message to give us  a call back to schedule.

## 2023-02-20 ENCOUNTER — Telehealth: Payer: Self-pay | Admitting: Orthopaedic Surgery

## 2023-02-20 NOTE — Telephone Encounter (Signed)
 VOB submitted for Monovisc, right knee

## 2023-02-20 NOTE — Telephone Encounter (Signed)
Patient called asked if he has been approved for the gel injections?  The number to contact patient is [phone]

## 2023-02-20 NOTE — Telephone Encounter (Signed)
Talked with patient concerning gel injections. 

## 2023-02-22 ENCOUNTER — Other Ambulatory Visit: Payer: Self-pay

## 2023-02-22 DIAGNOSIS — M1711 Unilateral primary osteoarthritis, right knee: Secondary | ICD-10-CM

## 2023-02-24 ENCOUNTER — Other Ambulatory Visit: Payer: PPO

## 2023-02-26 ENCOUNTER — Ambulatory Visit
Admission: RE | Admit: 2023-02-26 | Discharge: 2023-02-26 | Disposition: A | Discharge status: Home / Self Care | Payer: PPO | Source: Ambulatory Visit | Attending: Orthopaedic Surgery | Admitting: Orthopaedic Surgery

## 2023-02-26 DIAGNOSIS — M899 Disorder of bone, unspecified: Secondary | ICD-10-CM | POA: Diagnosis not present

## 2023-02-26 DIAGNOSIS — M79604 Pain in right leg: Secondary | ICD-10-CM

## 2023-02-27 ENCOUNTER — Encounter: Payer: Self-pay | Admitting: Orthopaedic Surgery

## 2023-02-27 ENCOUNTER — Ambulatory Visit (INDEPENDENT_AMBULATORY_CARE_PROVIDER_SITE_OTHER): Payer: PPO | Admitting: Orthopaedic Surgery

## 2023-02-27 DIAGNOSIS — M25561 Pain in right knee: Secondary | ICD-10-CM

## 2023-02-27 DIAGNOSIS — M1711 Unilateral primary osteoarthritis, right knee: Secondary | ICD-10-CM | POA: Diagnosis not present

## 2023-02-27 DIAGNOSIS — G8929 Other chronic pain: Secondary | ICD-10-CM

## 2023-02-27 MED ORDER — HYALURONAN 88 MG/4ML IX SOSY
88.0000 mg | PREFILLED_SYRINGE | INTRA_ARTICULAR | Status: AC | PRN
  Administered 2023-02-27: 88 mg via INTRA_ARTICULAR

## 2023-02-27 NOTE — Progress Notes (Signed)
 The patient is here today for scheduled hyaluronic acid injection in his right knee to treat the pain from osteoarthritis.  He has never had a gel injection before but he has had a steroid injection.  He did let us know that he did have an MRI of his right femur yesterday.  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  Again that was just done yesterday and there is not been a read back on that MRI.  I did look at that MRI myself but it is outside my level of expertise in terms of making a diagnosis and radiologic findings.  Examination of his right knee today shows no effusion.  I did place Monovisc in his right knee today which she tolerated well.  His [birth year] is actually tomorrow.  I would like to see him back in 4 weeks to see how the injection is done his knee and hopefully go over the MRI of his right femur at that visit assessing the chronic lesions he does have in his distal third of the femur.    Procedure Note  Patient: Fernando Pratt             Date of Birth: [birth year]           MRN: [id number]             Visit Date: 02/27/2023  Procedures: Visit Diagnoses:  1. Unilateral primary osteoarthritis, right knee   2. Chronic pain of right knee     Large Joint Inj: R knee on 02/27/2023 4:22 PM Indications: diagnostic evaluation and pain Details: 22 G 1.5 in needle, superolateral approach  Arthrogram: No  Medications: 88 mg Hyaluronan 88 MG/4ML Outcome: tolerated well, no immediate complications Procedure, treatment alternatives, risks and benefits explained, specific risks discussed. Consent was given by the patient. Immediately prior to procedure a time out was called to verify the correct patient, procedure, equipment, support staff and site/side marked as required. Patient was prepped and draped in the usual sterile fashion.     Lot #[id number]

## 2023-04-03 ENCOUNTER — Ambulatory Visit: Payer: PPO | Admitting: Orthopaedic Surgery

## 2023-04-03 ENCOUNTER — Encounter: Payer: Self-pay | Admitting: Orthopaedic Surgery

## 2023-04-03 DIAGNOSIS — M25561 Pain in right knee: Secondary | ICD-10-CM | POA: Diagnosis not present

## 2023-04-03 DIAGNOSIS — M1711 Unilateral primary osteoarthritis, right knee: Secondary | ICD-10-CM

## 2023-04-03 DIAGNOSIS — G8929 Other chronic pain: Secondary | ICD-10-CM | POA: Diagnosis not present

## 2023-04-03 NOTE — Progress Notes (Signed)
 The patient continues to follow-up for his arthritic right knee.  He has tried all forms conservative treatment and most recently we have tried hyaluronic acid for his right knee.  He said this really helped minimally and his knee is still bothersome for him.  He is active at 4.  He is not a diabetic.  At this point he wishes to proceed with a knee replacement.  He has tried and failed all forms conservative treatment including activity modification, home exercise program with quad strengthening exercises.  He has been on Celebrex as an anti-inflammatory.  He has had steroid injections and now hyaluronic acid injections for his right knee.  At this point it is detrimentally affecting his mobility, his quality of life and his actives daily living.  We have shared with him a knee replacement model in the past and his x-rays and describe what the surgery involves for his knee.  Examination of his right knee still shows significant patellofemoral crepitation and pain throughout the arc of motion of his right knee.  There is a mild effusion today.  There is medial and lateral joint line tenderness with mild varus malalignment.  At this point we will work on getting him scheduled for a right total knee arthroplasty.  We did discuss the risks and benefits of the surgery and what to expect from an intraoperative and postoperative standpoint.  All questions and concerns were addressed and answered.  We will be in touch to schedule him for a right total knee.

## 2023-05-25 ENCOUNTER — Encounter: Admitting: Orthopaedic Surgery

## 2023-07-06 ENCOUNTER — Encounter: Admitting: Orthopaedic Surgery

## 2023-09-06 ENCOUNTER — Other Ambulatory Visit: Payer: Self-pay | Admitting: Physician Assistant

## 2023-09-06 DIAGNOSIS — Z01818 Encounter for other preprocedural examination: Secondary | ICD-10-CM

## 2023-09-13 NOTE — Patient Instructions (Signed)
 SURGICAL WAITING ROOM VISITATION  Patients having surgery or a procedure may have no more than 2 support people in the waiting area - these visitors may rotate.    Children under the age of 94 must have an adult with them who is not the patient.  Visitors with respiratory illnesses are discouraged from visiting and should remain at home.  If the patient needs to stay at the hospital during part of their recovery, the visitor guidelines for inpatient rooms apply. Pre-op nurse will coordinate an appropriate time for 1 support person to accompany patient in pre-op.  This support person may not rotate.    Please refer to the [facility] website for the visitor guidelines for Inpatients (after your surgery is over and you are in a regular room).    Your procedure is scheduled on: 09/22/23   Report to [facility] Main Entrance    Report to admitting at 9:45 AM   Call this number if you have problems the morning of surgery [phone]   Do not eat food :After Midnight.   After Midnight you may have the following liquids until 9:15 AM DAY OF SURGERY  Water Non-Citrus Juices (without pulp, NO RED-Apple, White grape, White cranberry) Black Coffee (NO MILK/CREAM OR CREAMERS, sugar ok)  Clear Tea (NO MILK/CREAM OR CREAMERS, sugar ok) regular and decaf                             Plain Jell-O (NO RED)                                           Fruit ices (not with fruit pulp, NO RED)                                     Popsicles (NO RED)                                                               Sports drinks like Gatorade (NO RED)                 The day of surgery:  Drink ONE (1) Pre-Surgery Clear Ensure at 9:15 AM the morning of surgery. Drink in one sitting. Do not sip.  This drink was given to you during your hospital  pre-op appointment visit. Nothing else to drink after completing the  Pre-Surgery Clear Ensure.          If you have questions, please contact your  surgeon's office.   FOLLOW BOWEL PREP AND ANY ADDITIONAL PRE OP INSTRUCTIONS YOU RECEIVED FROM YOUR SURGEON'S OFFICE!!!     Oral Hygiene is also important to reduce your risk of infection.                                    Remember - BRUSH YOUR TEETH THE MORNING OF SURGERY WITH YOUR REGULAR TOOTHPASTE  DENTURES WILL BE REMOVED PRIOR TO SURGERY PLEASE DO NOT APPLY Poly grip OR  ADHESIVES!!!   Stop all vitamins and herbal supplements 7 days before surgery.   Take these medicines the morning of surgery with A SIP OF WATER: None                               You may not have any metal on your body including  jewelry, and body piercing             Do not wear lotions, powders, cologne, or deodorant              Men may shave face and neck.   Do not bring valuables to the hospital. [facility] IS NOT             RESPONSIBLE   FOR VALUABLES.   Contacts, glasses, dentures or bridgework may not be worn into surgery.   Bring small overnight bag day of surgery.   DO NOT BRING YOUR HOME MEDICATIONS TO THE HOSPITAL. PHARMACY WILL DISPENSE MEDICATIONS LISTED ON YOUR MEDICATION LIST TO YOU DURING YOUR ADMISSION IN THE HOSPITAL!              Please read over the following fact sheets you were given: IF YOU HAVE QUESTIONS ABOUT YOUR PRE-OP INSTRUCTIONS PLEASE CALL [phone].   If you received a COVID test during your pre-op visit  it is requested that you wear a mask when out in public, stay away from anyone that may not be feeling well and notify your surgeon if you develop symptoms. If you test positive for Covid or have been in contact with anyone that has tested positive in the last 10 days please notify you surgeon.      Pre-operative 5 CHG Bath Instructions   You can play a key role in reducing the risk of infection after surgery. Your skin needs to be as free of germs as possible. You can reduce the number of germs on your skin by washing with CHG (chlorhexidine gluconate)  soap before surgery. CHG is an antiseptic soap that kills germs and continues to kill germs even after washing.   DO NOT use if you have an allergy to chlorhexidine/CHG or antibacterial soaps. If your skin becomes reddened or irritated, stop using the CHG and notify one of our RNs at [phone].   Please shower with the CHG soap starting 4 days before surgery using the following schedule:     Please keep in mind the following:  DO NOT shave, including legs and underarms, starting the day of your first shower.   You may shave your face at any point before/day of surgery.  Place clean sheets on your bed the day you start using CHG soap. Use a clean washcloth (not used since being washed) for each shower. DO NOT sleep with pets once you start using the CHG.   CHG Shower Instructions:  If you choose to wash your hair and private area, wash first with your normal shampoo/soap.  After you use shampoo/soap, rinse your hair and body thoroughly to remove shampoo/soap residue.  Turn the water OFF and apply about 3 tablespoons (45 ml) of CHG soap to a CLEAN washcloth.  Apply CHG soap ONLY FROM YOUR NECK DOWN TO YOUR TOES (washing for 3-5 minutes)  DO NOT use CHG soap on face, private areas, open wounds, or sores.  Pay special attention to the area where your surgery is being performed.  If you are having back  surgery, having someone wash your back for you may be helpful. Wait 2 minutes after CHG soap is applied, then you may rinse off the CHG soap.  Pat dry with a clean towel  Put on clean clothes/pajamas   If you choose to wear lotion, please use ONLY the CHG-compatible lotions on the back of this paper.     Additional instructions for the day of surgery: DO NOT APPLY any lotions, deodorants, cologne, or perfumes.   Put on clean/comfortable clothes.  Brush your teeth.  Ask your nurse before applying any prescription medications to the skin.      CHG Compatible Lotions   Aveeno  Moisturizing lotion  Cetaphil Moisturizing Cream  Cetaphil Moisturizing Lotion  Clairol Herbal Essence Moisturizing Lotion, Dry Skin  Clairol Herbal Essence Moisturizing Lotion, Extra Dry Skin  Clairol Herbal Essence Moisturizing Lotion, Normal Skin  Curel Age Defying Therapeutic Moisturizing Lotion with Alpha Hydroxy  Curel Extreme Care Body Lotion  Curel Soothing Hands Moisturizing Hand Lotion  Curel Therapeutic Moisturizing Cream, Fragrance-Free  Curel Therapeutic Moisturizing Lotion, Fragrance-Free  Curel Therapeutic Moisturizing Lotion, Original Formula  Eucerin Daily Replenishing Lotion  Eucerin Dry Skin Therapy Plus Alpha Hydroxy Crme  Eucerin Dry Skin Therapy Plus Alpha Hydroxy Lotion  Eucerin Original Crme  Eucerin Original Lotion  Eucerin Plus Crme Eucerin Plus Lotion  Eucerin TriLipid Replenishing Lotion  Keri Anti-Bacterial Hand Lotion  Keri Deep Conditioning Original Lotion Dry Skin Formula Softly Scented  Keri Deep Conditioning Original Lotion, Fragrance Free Sensitive Skin Formula  Keri Lotion Fast Absorbing Fragrance Free Sensitive Skin Formula  Keri Lotion Fast Absorbing Softly Scented Dry Skin Formula  Keri Original Lotion  Keri Skin Renewal Lotion Keri Silky Smooth Lotion  Keri Silky Smooth Sensitive Skin Lotion  Nivea Body Creamy Conditioning Oil  Nivea Body Extra Enriched Lotion  Nivea Body Original Lotion  Nivea Body Sheer Moisturizing Lotion Nivea Crme  Nivea Skin Firming Lotion  NutraDerm 30 Skin Lotion  NutraDerm Skin Lotion  NutraDerm Therapeutic Skin Cream  NutraDerm Therapeutic Skin Lotion  ProShield Protective Hand Cream  Provon moisturizing lotion   View Pre-Surgery Education Videos:  [email or web address]     Incentive Spirometer  An incentive spirometer is a tool that can help keep your lungs clear and active. This tool measures how well you are filling your lungs with each  breath. Taking long deep breaths may help reverse or decrease the chance of developing breathing (pulmonary) problems (especially infection) following: A long period of time when you are unable to move or be active. BEFORE THE PROCEDURE  If the spirometer includes an indicator to show your best effort, your nurse or respiratory therapist will set it to a desired goal. If possible, sit up straight or lean slightly forward. Try not to slouch. Hold the incentive spirometer in an upright position. INSTRUCTIONS FOR USE  Sit on the edge of your bed if possible, or sit up as far as you can in bed or on a chair. Hold the incentive spirometer in an upright position. Breathe out normally. Place the mouthpiece in your mouth and seal your lips tightly around it. Breathe in slowly and as deeply as possible, raising the piston or the ball toward the top of the column. Hold your breath for 3-5 seconds or for as long as possible. Allow the piston or ball to fall to the bottom of the column. Remove the mouthpiece from your mouth and breathe out normally. Rest for a few seconds and repeat Steps 1 through 7  at least 10 times every 1-2 hours when you are awake. Take your time and take a few normal breaths between deep breaths. The spirometer may include an indicator to show your best effort. Use the indicator as a goal to work toward during each repetition. After each set of 10 deep breaths, practice coughing to be sure your lungs are clear. If you have an incision (the cut made at the time of surgery), support your incision when coughing by placing a pillow or rolled up towels firmly against it. Once you are able to get out of bed, walk around indoors and cough well. You may stop using the incentive spirometer when instructed by your caregiver.  RISKS AND COMPLICATIONS Take your time so you do not get dizzy or light-headed. If you are in pain, you may need to take or ask for pain medication before doing incentive  spirometry. It is harder to take a deep breath if you are having pain. AFTER USE Rest and breathe slowly and easily. It can be helpful to keep track of a log of your progress. Your caregiver can provide you with a simple table to help with this. If you are using the spirometer at home, follow these instructions: SEEK MEDICAL CARE IF:  You are having difficultly using the spirometer. You have trouble using the spirometer as often as instructed. Your pain medication is not giving enough relief while using the spirometer. You develop fever of 100.5 F (38.1 C) or higher. SEEK IMMEDIATE MEDICAL CARE IF:  You cough up bloody sputum that had not been present before. You develop fever of 102 F (38.9 C) or greater. You develop worsening pain at or near the incision site. MAKE SURE YOU:  Understand these instructions. Will watch your condition. Will get help right away if you are not doing well or get worse. Document Released: 05/02/2006 Document Revised: 03/14/2011 Document Reviewed: 07/03/2006 [facility] Patient Information 2014 [place], [place].   ________________________________________________________________________

## 2023-09-13 NOTE — Progress Notes (Signed)
 COVID Vaccine Completed: yes  Date of COVID positive in last 90 days:  PCP - Butler Der, MD Cardiologist - n/a  Chest x-ray - N/A EKG - 11/29/22 Epic Stress Test - N/A ECHO - N/A Cardiac Cath - n/a Pacemaker/ICD device last checked:N/A Spinal Cord Stimulator:N/A  Bowel Prep - N/A  Sleep Study - N/A CPAP -   Fasting Blood Sugar - N/A Checks Blood Sugar _____ times a day  Last dose of GLP1 agonist-  N/A GLP1 instructions:  Do not take after     Last dose of SGLT-2 inhibitors-  N/A SGLT-2 instructions:  Do not take after     Blood Thinner Instructions: N/A Last dose:   Time: Aspirin Instructions:N/A Last Dose:  Activity level: Can go up a flight of stairs and perform activities of daily living without stopping and without symptoms of chest pain or shortness of breath.  Anesthesia review:  Patient denies shortness of breath, fever, cough and chest pain at PAT appointment  Patient verbalized understanding of instructions that were given to them at the PAT appointment. Patient was also instructed that they will need to review over the PAT instructions again at home before surgery.

## 2023-09-14 ENCOUNTER — Encounter (HOSPITAL_COMMUNITY): Payer: Self-pay

## 2023-09-14 ENCOUNTER — Encounter (HOSPITAL_COMMUNITY)
Admission: RE | Admit: 2023-09-14 | Discharge: 2023-09-14 | Disposition: A | Discharge status: Home / Self Care | Source: Ambulatory Visit | Attending: Orthopaedic Surgery | Admitting: Orthopaedic Surgery

## 2023-09-14 ENCOUNTER — Other Ambulatory Visit: Payer: Self-pay

## 2023-09-14 DIAGNOSIS — Z01818 Encounter for other preprocedural examination: Secondary | ICD-10-CM | POA: Insufficient documentation

## 2023-09-14 HISTORY — DX: Other complications of anesthesia, initial encounter: T88.59XA

## 2023-09-14 LAB — BASIC METABOLIC PANEL WITH GFR
Anion gap: 8 (ref 5–15)
BUN: 30 mg/dL — ABNORMAL HIGH (ref 8–23)
CO2: 26 mmol/L (ref 22–32)
Calcium: 9.3 mg/dL (ref 8.9–10.3)
Chloride: 108 mmol/L (ref 98–111)
Creatinine, Ser: 1.28 mg/dL — ABNORMAL HIGH (ref 0.61–1.24)
GFR, Estimated: 57 mL/min — ABNORMAL LOW (ref >60)
Glucose, Bld: 110 mg/dL — ABNORMAL HIGH (ref 70–99)
Potassium: 5.1 mmol/L (ref 3.5–5.1)
Sodium: 142 mmol/L (ref 135–145)

## 2023-09-14 LAB — CBC
HCT: 45.4 % (ref 39.0–52.0)
Hemoglobin: 14.3 g/dL (ref 13.0–17.0)
MCH: 28.8 pg (ref 26.0–34.0)
MCHC: 31.5 g/dL (ref 30.0–36.0)
MCV: 91.3 fL (ref 80.0–100.0)
Platelets: 114 K/uL — ABNORMAL LOW (ref 150–400)
RBC: 4.97 MIL/uL (ref 4.22–5.81)
RDW: 12.4 % (ref 11.5–15.5)
WBC: 6.3 K/uL (ref 4.0–10.5)
nRBC: 0 % (ref 0.0–0.2)

## 2023-09-14 LAB — SURGICAL PCR SCREEN
MRSA, PCR: NEGATIVE
Staphylococcus aureus: NEGATIVE

## 2023-09-21 ENCOUNTER — Telehealth: Payer: Self-pay | Admitting: *Deleted

## 2023-09-21 DIAGNOSIS — M1711 Unilateral primary osteoarthritis, right knee: Principal | ICD-10-CM | POA: Insufficient documentation

## 2023-09-21 NOTE — Care Plan (Signed)
 [facility] RNCM call to patient to discuss his upcoming Right total knee arthroplasty with Dr. Vernetta on 09/22/23 at [facility]. He is agreeable to case management. He has a girlfriend that will be assisting him at home after discharge. He will need a RW. Order sent to [facility]. Anticipate HHPT will be needed after short hospital stay. Referral sent to [facility] after choice provided. Reviewed all post op care instructions and questions answered. Will continue to follow for needs.

## 2023-09-21 NOTE — H&P (Signed)
 TOTAL KNEE ADMISSION H&P  Patient is being admitted for right total knee arthroplasty.  Subjective:  Chief Complaint:right knee pain.  HPI: Fernando Pratt, 78 y.o. male, has a history of pain and functional disability in the right knee due to arthritis and has failed non-surgical conservative treatments for greater than 12 weeks to includeNSAID's and/or analgesics, corticosteriod injections, viscosupplementation injections, flexibility and strengthening excercises, and activity modification.  Onset of symptoms was gradual, starting a few years ago with gradually worsening course since that time. The patient noted no past surgery on the right knee(s).  Patient currently rates pain in the right knee(s) at 10 out of 10 with activity. Patient has night pain, worsening of pain with activity and weight bearing, pain that interferes with activities of daily living, pain with passive range of motion, crepitus, and joint swelling.  Patient has evidence of subchondral sclerosis, periarticular osteophytes, and joint space narrowing by imaging studies. There is no active infection.  Patient Active Problem List   Diagnosis Date Noted   Unilateral primary osteoarthritis, right knee 09/21/2023   Spondylosis, cervical, with myelopathy 09/22/2015   Pain in joint of left shoulder 09/22/2015   Hyperlipidemia 09/22/2015   Neuropathy 09/22/2015   Vitamin D  deficiency 09/22/2015   Past Medical History:  Diagnosis Date   Arthritis    Complication of anesthesia    slow to wake up after neck surgery   Hyperlipidemia     Past Surgical History:  Procedure Laterality Date   COLONOSCOPY     NECK SURGERY  01/03/1989    No current facility-administered medications for this encounter.   Current Outpatient Medications  Medication Sig Dispense Refill Last Dose/Taking   celecoxib  (CELEBREX ) 400 MG capsule Take 1 capsule (400 mg total) by mouth daily. With food 90 capsule 3 Taking   No Known Allergies  Social  History   Tobacco Use   Smoking status: Former    Types: Cigars   Smokeless tobacco: Never  Substance Use Topics   Alcohol use: No    Family History  Problem Relation Age of Onset   Cancer Mother        colon and breast   Arthritis Mother    Healthy Son    Healthy Son    Healthy Son      Review of Systems  Objective:  Physical Exam Vitals reviewed.  Constitutional:      Appearance: Normal appearance.  HENT:     Head: Normocephalic and atraumatic.  Eyes:     Extraocular Movements: Extraocular movements intact.     Pupils: Pupils are equal, round, and reactive to light.  Cardiovascular:     Rate and Rhythm: Normal rate and regular rhythm.  Pulmonary:     Effort: Pulmonary effort is normal.     Breath sounds: Normal breath sounds.  Abdominal:     Palpations: Abdomen is soft.  Musculoskeletal:     Cervical back: Normal range of motion and neck supple.     Right knee: Effusion, bony tenderness and crepitus present. Decreased range of motion. Tenderness present over the medial joint line and lateral joint line. Abnormal alignment and abnormal meniscus.  Neurological:     Mental Status: He is alert and oriented to person, place, and time.  Psychiatric:        Behavior: Behavior normal.     Vital signs in last 24 hours:    Labs:   Estimated body mass index is 31.8 kg/m as calculated from the following:  Height as of 09/14/23: 6' 1 (1.854 m).   Weight as of 09/14/23: 109.3 kg.   Imaging Review Plain radiographs demonstrate severe degenerative joint disease of the right knee(s). The overall alignment ismild varus. The bone quality appears to be good for age and reported activity level.      Assessment/Plan:  End stage arthritis, right knee   The patient history, physical examination, clinical judgment of the provider and imaging studies are consistent with end stage degenerative joint disease of the right knee(s) and total knee arthroplasty is deemed  medically necessary. The treatment options including medical management, injection therapy arthroscopy and arthroplasty were discussed at length. The risks and benefits of total knee arthroplasty were presented and reviewed. The risks due to aseptic loosening, infection, stiffness, patella tracking problems, thromboembolic complications and other imponderables were discussed. The patient acknowledged the explanation, agreed to proceed with the plan and consent was signed. Patient is being admitted for inpatient treatment for surgery, pain control, PT, OT, prophylactic antibiotics, VTE prophylaxis, progressive ambulation and ADL's and discharge planning. The patient is planning to be discharged home with home health services

## 2023-09-21 NOTE — Telephone Encounter (Signed)
 [facility] pre-op  call to discuss Right total knee arthroplasty with Dr. Vernetta.

## 2023-09-22 ENCOUNTER — Encounter (HOSPITAL_COMMUNITY)
Admission: RE | Disposition: A | Discharge status: Home / Self Care | Payer: Self-pay | Source: Home / Self Care | Attending: Orthopaedic Surgery

## 2023-09-22 ENCOUNTER — Ambulatory Visit (HOSPITAL_BASED_OUTPATIENT_CLINIC_OR_DEPARTMENT_OTHER)

## 2023-09-22 ENCOUNTER — Ambulatory Visit (HOSPITAL_COMMUNITY)

## 2023-09-22 ENCOUNTER — Other Ambulatory Visit: Payer: Self-pay

## 2023-09-22 ENCOUNTER — Observation Stay (HOSPITAL_COMMUNITY)
Admission: RE | Admit: 2023-09-22 | Discharge: 2023-09-23 | Disposition: A | Discharge status: Home / Self Care | Attending: Orthopaedic Surgery | Admitting: Orthopaedic Surgery

## 2023-09-22 ENCOUNTER — Observation Stay (HOSPITAL_COMMUNITY)

## 2023-09-22 ENCOUNTER — Encounter (HOSPITAL_COMMUNITY): Payer: Self-pay | Admitting: Orthopaedic Surgery

## 2023-09-22 DIAGNOSIS — Z471 Aftercare following joint replacement surgery: Secondary | ICD-10-CM | POA: Diagnosis not present

## 2023-09-22 DIAGNOSIS — M25561 Pain in right knee: Secondary | ICD-10-CM | POA: Diagnosis present

## 2023-09-22 DIAGNOSIS — M1711 Unilateral primary osteoarthritis, right knee: Principal | ICD-10-CM | POA: Insufficient documentation

## 2023-09-22 DIAGNOSIS — Z96651 Presence of right artificial knee joint: Secondary | ICD-10-CM

## 2023-09-22 DIAGNOSIS — Z7982 Long term (current) use of aspirin: Secondary | ICD-10-CM | POA: Diagnosis not present

## 2023-09-22 DIAGNOSIS — M25461 Effusion, right knee: Secondary | ICD-10-CM | POA: Diagnosis not present

## 2023-09-22 DIAGNOSIS — Z87891 Personal history of nicotine dependence: Secondary | ICD-10-CM | POA: Insufficient documentation

## 2023-09-22 DIAGNOSIS — G8918 Other acute postprocedural pain: Secondary | ICD-10-CM | POA: Diagnosis not present

## 2023-09-22 HISTORY — PX: TOTAL KNEE ARTHROPLASTY: SHX125

## 2023-09-22 LAB — ABO/RH: ABO/RH(D): O POS

## 2023-09-22 LAB — TYPE AND SCREEN
ABO/RH(D): O POS
Antibody Screen: NEGATIVE

## 2023-09-22 SURGERY — ARTHROPLASTY, KNEE, TOTAL
Anesthesia: Spinal | Site: Knee | Laterality: Right

## 2023-09-22 MED ORDER — CLONIDINE HCL (ANALGESIA) 100 MCG/ML EP SOLN
EPIDURAL | Status: DC | PRN
  Administered 2023-09-22: 80 ug

## 2023-09-22 MED ORDER — ASPIRIN 81 MG PO CHEW
81.0000 mg | CHEWABLE_TABLET | Freq: Two times a day (BID) | ORAL | Status: DC
  Administered 2023-09-22 – 2023-09-23 (×2): 81 mg via ORAL
  Filled 2023-09-22 (×2): qty 1

## 2023-09-22 MED ORDER — ONDANSETRON HCL 4 MG/2ML IJ SOLN
4.0000 mg | Freq: Four times a day (QID) | INTRAMUSCULAR | Status: DC | PRN
  Administered 2023-09-23: 4 mg via INTRAVENOUS
  Filled 2023-09-22: qty 2

## 2023-09-22 MED ORDER — DEXAMETHASONE SODIUM PHOSPHATE 10 MG/ML IJ SOLN
INTRAMUSCULAR | Status: AC
Start: 2023-09-22 — End: 2023-09-22
  Filled 2023-09-22: qty 1

## 2023-09-22 MED ORDER — MIDAZOLAM HCL 2 MG/2ML IJ SOLN: 1.0000 mg | INTRAMUSCULAR | Status: DC

## 2023-09-22 MED ORDER — BUPIVACAINE IN DEXTROSE 0.75-8.25 % IT SOLN
INTRATHECAL | Status: DC | PRN
Start: 2023-09-22 — End: 2023-09-22
  Administered 2023-09-22: 1.8 mL via INTRATHECAL

## 2023-09-22 MED ORDER — BUPIVACAINE-EPINEPHRINE (PF) 0.25% -1:200000 IJ SOLN: INTRAMUSCULAR | Status: DC | PRN

## 2023-09-22 MED ORDER — CEFAZOLIN SODIUM-DEXTROSE 2-4 GM/100ML-% IV SOLN
2.0000 g | INTRAVENOUS | Status: AC
  Administered 2023-09-22: 2 g via INTRAVENOUS
  Filled 2023-09-22: qty 100

## 2023-09-22 MED ORDER — PHENYLEPHRINE HCL-NACL 20-0.9 MG/250ML-% IV SOLN
INTRAVENOUS | Status: DC | PRN
  Administered 2023-09-22: 20 ug/min via INTRAVENOUS

## 2023-09-22 MED ORDER — OXYCODONE HCL 5 MG PO TABS: 10.0000 mg | ORAL_TABLET | ORAL | Status: DC | PRN

## 2023-09-22 MED ORDER — METHOCARBAMOL 1000 MG/10ML IJ SOLN: 500.0000 mg | Freq: Four times a day (QID) | INTRAMUSCULAR | Status: DC | PRN

## 2023-09-22 MED ORDER — ORAL CARE MOUTH RINSE: 15.0000 mL | Freq: Once | OROMUCOSAL | Status: AC

## 2023-09-22 MED ORDER — DOCUSATE SODIUM 100 MG PO CAPS
100.0000 mg | ORAL_CAPSULE | Freq: Two times a day (BID) | ORAL | Status: DC
  Administered 2023-09-22 – 2023-09-23 (×2): 100 mg via ORAL
  Filled 2023-09-22 (×2): qty 1

## 2023-09-22 MED ORDER — FENTANYL CITRATE (PF) 100 MCG/2ML IJ SOLN
INTRAMUSCULAR | Status: DC | PRN
  Administered 2023-09-22 (×4): 25 ug via INTRAVENOUS

## 2023-09-22 MED ORDER — DROPERIDOL 2.5 MG/ML IJ SOLN: 0.6250 mg | Freq: Once | INTRAMUSCULAR | Status: DC | PRN

## 2023-09-22 MED ORDER — METOCLOPRAMIDE HCL 5 MG PO TABS: 5.0000 mg | ORAL_TABLET | Freq: Three times a day (TID) | ORAL | Status: DC | PRN

## 2023-09-22 MED ORDER — FENTANYL CITRATE (PF) 100 MCG/2ML IJ SOLN
INTRAMUSCULAR | Status: AC
  Filled 2023-09-22: qty 2

## 2023-09-22 MED ORDER — METHOCARBAMOL 500 MG PO TABS
500.0000 mg | ORAL_TABLET | Freq: Four times a day (QID) | ORAL | Status: DC | PRN
  Administered 2023-09-22 – 2023-09-23 (×3): 500 mg via ORAL
  Filled 2023-09-22 (×2): qty 1

## 2023-09-22 MED ORDER — FENTANYL CITRATE PF 50 MCG/ML IJ SOSY
50.0000 ug | PREFILLED_SYRINGE | INTRAMUSCULAR | Status: DC
  Administered 2023-09-22: 50 ug via INTRAVENOUS
  Filled 2023-09-22: qty 2

## 2023-09-22 MED ORDER — SODIUM CHLORIDE 0.9 % IR SOLN
Status: DC | PRN
  Administered 2023-09-22: 1000 mL

## 2023-09-22 MED ORDER — ACETAMINOPHEN 325 MG PO TABS
325.0000 mg | ORAL_TABLET | Freq: Four times a day (QID) | ORAL | Status: DC | PRN
  Administered 2023-09-23: 650 mg via ORAL
  Filled 2023-09-22: qty 2

## 2023-09-22 MED ORDER — OXYCODONE HCL 5 MG PO TABS
ORAL_TABLET | ORAL | Status: AC
  Filled 2023-09-22: qty 1

## 2023-09-22 MED ORDER — CEFAZOLIN SODIUM-DEXTROSE 2-4 GM/100ML-% IV SOLN
2.0000 g | Freq: Four times a day (QID) | INTRAVENOUS | Status: AC
  Administered 2023-09-22 – 2023-09-23 (×2): 2 g via INTRAVENOUS
  Filled 2023-09-22 (×2): qty 100

## 2023-09-22 MED ORDER — MENTHOL 3 MG MT LOZG: 1.0000 | LOZENGE | OROMUCOSAL | Status: DC | PRN

## 2023-09-22 MED ORDER — BUPIVACAINE-EPINEPHRINE 0.25% -1:200000 IJ SOLN
INTRAMUSCULAR | Status: DC | PRN
  Administered 2023-09-22: 30 mL

## 2023-09-22 MED ORDER — PROPOFOL 1000 MG/100ML IV EMUL
INTRAVENOUS | Status: AC
Start: 2023-09-22 — End: 2023-09-22
  Filled 2023-09-22: qty 100

## 2023-09-22 MED ORDER — PROPOFOL 500 MG/50ML IV EMUL
INTRAVENOUS | Status: DC | PRN
  Administered 2023-09-22: 25 ug/kg/min via INTRAVENOUS

## 2023-09-22 MED ORDER — DEXAMETHASONE SODIUM PHOSPHATE 4 MG/ML IJ SOLN: INTRAMUSCULAR | Status: DC | PRN

## 2023-09-22 MED ORDER — ROPIVACAINE HCL 5 MG/ML IJ SOLN: INTRAMUSCULAR | Status: DC | PRN

## 2023-09-22 MED ORDER — ONDANSETRON HCL 4 MG/2ML IJ SOLN
INTRAMUSCULAR | Status: AC
  Filled 2023-09-22: qty 2

## 2023-09-22 MED ORDER — OXYCODONE HCL 5 MG PO TABS
5.0000 mg | ORAL_TABLET | ORAL | Status: DC | PRN
  Administered 2023-09-22 – 2023-09-23 (×4): 5 mg via ORAL
  Filled 2023-09-22: qty 1
  Filled 2023-09-22 (×2): qty 2
  Filled 2023-09-22: qty 1

## 2023-09-22 MED ORDER — ONDANSETRON HCL 4 MG/2ML IJ SOLN
INTRAMUSCULAR | Status: DC | PRN
  Administered 2023-09-22: 4 mg via INTRAVENOUS

## 2023-09-22 MED ORDER — ROPIVACAINE HCL 2 MG/ML IJ SOLN
INTRAMUSCULAR | Status: DC | PRN
  Administered 2023-09-22: 30 mL via EPIDURAL

## 2023-09-22 MED ORDER — METOCLOPRAMIDE HCL 5 MG/ML IJ SOLN
5.0000 mg | Freq: Three times a day (TID) | INTRAMUSCULAR | Status: DC | PRN
  Administered 2023-09-23: 10 mg via INTRAVENOUS
  Filled 2023-09-22: qty 2

## 2023-09-22 MED ORDER — TRANEXAMIC ACID-NACL 1000-0.7 MG/100ML-% IV SOLN
1000.0000 mg | INTRAVENOUS | Status: AC
  Administered 2023-09-22: 1000 mg via INTRAVENOUS
  Filled 2023-09-22: qty 100

## 2023-09-22 MED ORDER — PHENYLEPHRINE 80 MCG/ML (10ML) SYRINGE FOR IV PUSH (FOR BLOOD PRESSURE SUPPORT)
PREFILLED_SYRINGE | INTRAVENOUS | Status: AC
  Filled 2023-09-22: qty 20

## 2023-09-22 MED ORDER — 0.9 % SODIUM CHLORIDE (POUR BTL) OPTIME
TOPICAL | Status: DC | PRN
  Administered 2023-09-22: 1000 mL

## 2023-09-22 MED ORDER — BUPIVACAINE-EPINEPHRINE (PF) 0.25% -1:200000 IJ SOLN
INTRAMUSCULAR | Status: AC
  Filled 2023-09-22: qty 30

## 2023-09-22 MED ORDER — FENTANYL CITRATE PF 50 MCG/ML IJ SOSY: 25.0000 ug | PREFILLED_SYRINGE | INTRAMUSCULAR | Status: DC | PRN

## 2023-09-22 MED ORDER — OXYCODONE HCL 5 MG PO TABS
5.0000 mg | ORAL_TABLET | Freq: Once | ORAL | Status: AC | PRN
  Administered 2023-09-22: 5 mg via ORAL

## 2023-09-22 MED ORDER — ONDANSETRON HCL 4 MG PO TABS
4.0000 mg | ORAL_TABLET | Freq: Four times a day (QID) | ORAL | Status: DC | PRN
  Filled 2023-09-22: qty 1

## 2023-09-22 MED ORDER — LACTATED RINGERS IV SOLN: INTRAVENOUS | Status: DC

## 2023-09-22 MED ORDER — OXYCODONE HCL 5 MG/5ML PO SOLN: 5.0000 mg | Freq: Once | ORAL | Status: AC | PRN

## 2023-09-22 MED ORDER — DEXAMETHASONE SODIUM PHOSPHATE 10 MG/ML IJ SOLN
INTRAMUSCULAR | Status: DC | PRN
  Administered 2023-09-22: 4 mg via INTRAVENOUS

## 2023-09-22 MED ORDER — PANTOPRAZOLE SODIUM 40 MG PO TBEC
40.0000 mg | DELAYED_RELEASE_TABLET | Freq: Every day | ORAL | Status: DC
  Administered 2023-09-23: 40 mg via ORAL
  Filled 2023-09-22: qty 1

## 2023-09-22 MED ORDER — STERILE WATER FOR IRRIGATION IR SOLN
Status: DC | PRN
  Administered 2023-09-22: 2000 mL

## 2023-09-22 MED ORDER — PROPOFOL 500 MG/50ML IV EMUL
INTRAVENOUS | Status: AC
Start: 2023-09-22 — End: 2023-09-22
  Filled 2023-09-22: qty 50

## 2023-09-22 MED ORDER — CHLORHEXIDINE GLUCONATE 0.12 % MT SOLN
15.0000 mL | Freq: Once | OROMUCOSAL | Status: AC
  Administered 2023-09-22: 15 mL via OROMUCOSAL

## 2023-09-22 MED ORDER — POVIDONE-IODINE 10 % EX SWAB
2.0000 | Freq: Once | CUTANEOUS | Status: AC
  Administered 2023-09-22: 2 via TOPICAL

## 2023-09-22 MED ORDER — ONDANSETRON HCL 4 MG/2ML IJ SOLN: 4.0000 mg | Freq: Once | INTRAMUSCULAR | Status: DC | PRN

## 2023-09-22 MED ORDER — ALUM & MAG HYDROXIDE-SIMETH 200-200-20 MG/5ML PO SUSP: 30.0000 mL | ORAL | Status: DC | PRN

## 2023-09-22 MED ORDER — PHENYLEPHRINE 80 MCG/ML (10ML) SYRINGE FOR IV PUSH (FOR BLOOD PRESSURE SUPPORT)
PREFILLED_SYRINGE | INTRAVENOUS | Status: DC | PRN
  Administered 2023-09-22: 80 ug via INTRAVENOUS
  Administered 2023-09-22: 120 ug via INTRAVENOUS
  Administered 2023-09-22: 80 ug via INTRAVENOUS

## 2023-09-22 MED ORDER — METHOCARBAMOL 500 MG PO TABS
ORAL_TABLET | ORAL | Status: AC
Start: 2023-09-22 — End: 2023-09-22
  Filled 2023-09-22: qty 1

## 2023-09-22 MED ORDER — PHENOL 1.4 % MT LIQD: 1.0000 | OROMUCOSAL | Status: DC | PRN

## 2023-09-22 MED ORDER — PROPOFOL 10 MG/ML IV BOLUS
INTRAVENOUS | Status: AC
  Filled 2023-09-22: qty 20

## 2023-09-22 MED ORDER — HYDROMORPHONE HCL 1 MG/ML IJ SOLN: 0.5000 mg | INTRAMUSCULAR | Status: DC | PRN

## 2023-09-22 MED ORDER — DIPHENHYDRAMINE HCL 12.5 MG/5ML PO ELIX: 12.5000 mg | ORAL_SOLUTION | ORAL | Status: DC | PRN

## 2023-09-22 SURGICAL SUPPLY — 51 items
BAG COUNTER SPONGE SURGICOUNT (BAG) IMPLANT
BAG ZIPLOCK 12X15 (MISCELLANEOUS) ×1 IMPLANT
BENZOIN TINCTURE PRP APPL 2/3 (GAUZE/BANDAGES/DRESSINGS) IMPLANT
BLADE SAG 18X100X1.27 (BLADE) ×1 IMPLANT
BNDG ELASTIC 6INX 5YD STR LF (GAUZE/BANDAGES/DRESSINGS) ×2 IMPLANT
BOWL SMART MIX CTS (DISPOSABLE) IMPLANT
CEMENT BONE R 1X40 (Cement) IMPLANT
COMPONENT FEM CMT PS STD 11 RT (Joint) IMPLANT
COMPONENT PATELLA PEG 3 32 (Joint) IMPLANT
COMPONENT TIB KNEE PS G 0 RT (Joint) IMPLANT
COOLER ICEMAN CLASSIC (MISCELLANEOUS) ×1 IMPLANT
COVER SURGICAL LIGHT HANDLE (MISCELLANEOUS) ×1 IMPLANT
CUFF TRNQT CYL 34X4.125X (TOURNIQUET CUFF) ×1 IMPLANT
DRAPE U-SHAPE 47X51 STRL (DRAPES) ×1 IMPLANT
DURAPREP 26ML APPLICATOR (WOUND CARE) ×1 IMPLANT
ELECT BLADE TIP CTD 4 INCH (ELECTRODE) ×1 IMPLANT
ELECT PENCIL ROCKER SW 15FT (MISCELLANEOUS) ×1 IMPLANT
ELECT REM PT RETURN 15FT ADLT (MISCELLANEOUS) ×1 IMPLANT
GAUZE PAD ABD 8X10 STRL (GAUZE/BANDAGES/DRESSINGS) ×2 IMPLANT
GAUZE SPONGE 4X4 12PLY STRL (GAUZE/BANDAGES/DRESSINGS) ×1 IMPLANT
GAUZE XEROFORM 1X8 LF (GAUZE/BANDAGES/DRESSINGS) IMPLANT
GLOVE BIO SURGEON STRL SZ7.5 (GLOVE) ×1 IMPLANT
GLOVE BIOGEL PI IND STRL 8 (GLOVE) ×2 IMPLANT
GLOVE ECLIPSE 8.0 STRL XLNG CF (GLOVE) ×1 IMPLANT
GOWN STRL REUS W/ TWL XL LVL3 (GOWN DISPOSABLE) ×2 IMPLANT
HOLDER FOLEY CATH W/STRAP (MISCELLANEOUS) IMPLANT
IMMOBILIZER KNEE 20 THIGH 36 (SOFTGOODS) ×1 IMPLANT
INSERT TIBIAL PERSONA 10 RT (Insert) IMPLANT
KIT TURNOVER KIT A (KITS) ×1 IMPLANT
MANIFOLD NEPTUNE II (INSTRUMENTS) ×1 IMPLANT
NS IRRIG 1000ML POUR BTL (IV SOLUTION) ×1 IMPLANT
PACK TOTAL KNEE CUSTOM (KITS) ×1 IMPLANT
PAD COLD SHLDR WRAP-ON (PAD) ×1 IMPLANT
PADDING CAST COTTON 6X4 STRL (CAST SUPPLIES) ×2 IMPLANT
PIN DRILL HDLS TROCAR 75 4PK (PIN) IMPLANT
PROTECTOR NERVE ULNAR (MISCELLANEOUS) ×1 IMPLANT
SCREW FEMALE HEX FIX 25X2.5 (ORTHOPEDIC DISPOSABLE SUPPLIES) IMPLANT
SET HNDPC FAN SPRY TIP SCT (DISPOSABLE) ×1 IMPLANT
SET PAD KNEE POSITIONER (MISCELLANEOUS) ×1 IMPLANT
SPIKE FLUID TRANSFER (MISCELLANEOUS) IMPLANT
STAPLER SKIN PROX 35W (STAPLE) IMPLANT
STRIP CLOSURE SKIN 1/2X4 (GAUZE/BANDAGES/DRESSINGS) IMPLANT
SUT MNCRL AB 4-0 PS2 18 (SUTURE) IMPLANT
SUT VIC AB 0 CT1 36 (SUTURE) ×1 IMPLANT
SUT VIC AB 1 CT1 36 (SUTURE) ×2 IMPLANT
SUT VIC AB 2-0 CT1 TAPERPNT 27 (SUTURE) ×2 IMPLANT
SUT VIC AB 3-0 PS2 18XBRD (SUTURE) IMPLANT
TOWEL GREEN STERILE FF (TOWEL DISPOSABLE) ×1 IMPLANT
TRAY FOLEY MTR SLVR 16FR STAT (SET/KITS/TRAYS/PACK) IMPLANT
WATER STERILE IRR 1000ML POUR (IV SOLUTION) ×2 IMPLANT
YANKAUER SUCT BULB TIP NO VENT (SUCTIONS) ×1 IMPLANT

## 2023-09-22 NOTE — Anesthesia Procedure Notes (Addendum)
 Spinal  Patient location during procedure: OR Start time: 09/22/2023 12:26 PM End time: 09/22/2023 12:36 PM Reason for block: surgical anesthesia Staffing Performed: anesthesiologist  Anesthesiologist: Darlyn Rush, MD Performed by: Darlyn Rush, MD Authorized by: Waddell Lauraine NOVAK, MD   Preanesthetic Checklist Completed: patient identified, IV checked, site marked, risks and benefits discussed, surgical consent, monitors and equipment checked, pre-op  evaluation and timeout performed Spinal Block Patient position: sitting Prep: DuraPrep and site prepped and draped Patient monitoring: heart rate, continuous pulse ox and blood pressure Approach: midline Location: L3-4 Injection technique: single-shot Needle Needle type: Spinocan  Needle gauge: 25 G Needle length: 9 cm Additional Notes Expiration date of kit checked and confirmed. Patient tolerated procedure well, without complications.

## 2023-09-22 NOTE — Anesthesia Postprocedure Evaluation (Signed)
 Anesthesia Post Note  Patient: Fernando Pratt  Procedure(s) Performed: ARTHROPLASTY, KNEE, TOTAL (Right: Knee)     Patient location during evaluation: Endoscopy Anesthesia Type: Spinal Level of consciousness: awake Vital Signs Assessment: post-procedure vital signs reviewed and stable Respiratory status: spontaneous breathing Cardiovascular status: blood pressure returned to baseline Postop Assessment: no apparent nausea or vomiting Anesthetic complications: no   No notable events documented.  Last Vitals:    Last Pain:  Vitals:   09/22/23 1607  TempSrc:   PainSc: (P) 3                  Lauraine KATHEE [place]

## 2023-09-22 NOTE — Anesthesia Preprocedure Evaluation (Signed)
 Anesthesia Evaluation  Patient identified by MRN, date of birth, ID band Patient awake    Reviewed: Allergy & Precautions, NPO status , Patient's Chart, lab work & pertinent test results  History of Anesthesia Complications Negative for: history of anesthetic complications  Airway Mallampati: I       Dental no notable dental hx. (+) Dental Advisory Given   Pulmonary former smoker   breath sounds clear to auscultation       Cardiovascular negative cardio ROS  Rhythm:Regular     Neuro/Psych S/p Cervical Spine Fusion     GI/Hepatic negative GI ROS, Neg liver ROS,,,  Endo/Other  negative endocrine ROS    Renal/GU negative Renal ROS     Musculoskeletal  (+) Arthritis ,    Abdominal   Peds  Hematology   Anesthesia Other Findings   Reproductive/Obstetrics                              Anesthesia Physical Anesthesia Plan  ASA: 2  Anesthesia Plan: Spinal   Post-op Pain Management:    Induction:   PONV Risk Score and Plan: 1  Airway Management Planned:   Additional Equipment:   Intra-op Plan:   Post-operative Plan:   Informed Consent:      Dental advisory given  Plan Discussed with:   Anesthesia Plan Comments:          Anesthesia Quick Evaluation

## 2023-09-22 NOTE — Evaluation (Signed)
 Physical Therapy Evaluation Patient Details Name: Fernando Pratt MRN: [id number] DOB: 1945-03-27 Today's Date: 09/22/2023  History of Present Illness  78 yo male presents to therapy s/p R TKA on 09/22/2023 due to failure of conservative measures. Pt PMH includes but is not limited to: OA, HLD, neuropathy, and neck surgery.  Clinical Impression      Fernando Pratt is a 78 y.o. male POD 0 s/p R TKA. Patient reports IND with mobility at baseline. Patient is now limited by functional impairments (see PT problem list below) and requires CGA for bed mobility and CGA for transfers. Patient was unable to safely ambulate at time of eval due to R knee instability attributed to slow regression of anesthesia. Patient instructed in exercise to facilitate ROM and circulation to manage edema. Patient will benefit from continued skilled PT interventions to address impairments and progress towards PLOF. Acute PT will follow to progress mobility and stair training in preparation for safe discharge home with significant other and HH services.     If plan is discharge home, recommend the following: A little help with walking and/or transfers;A little help with bathing/dressing/bathroom;Assistance with cooking/housework;Assist for transportation;Help with stairs or ramp for entrance   Can travel by private vehicle        Equipment Recommendations Rolling walker (2 wheels)  Recommendations for Other Services       Functional Status Assessment Patient has had a recent decline in their functional status and demonstrates the ability to make significant improvements in function in a reasonable and predictable amount of time.     Precautions / Restrictions Precautions Precautions: Fall;Knee Restrictions Weight Bearing Restrictions Per Provider Order: No      Mobility  Bed Mobility Overal bed mobility: Needs Assistance Bed Mobility: Supine to Sit     Supine to sit: Contact guard     General bed  mobility comments: min cues and HOB slightly elevated    Transfers Overall transfer level: Needs assistance Equipment used: Rolling walker (2 wheels) Transfers: Sit to/from Stand, Bed to chair/wheelchair/BSC Sit to Stand: Contact guard assist Stand pivot transfers: Contact guard assist         General transfer comment: min cues for safety and technique with heavy reliance on B UE support due to R knee instabilty attributed to slow regression of anethesia    Ambulation/Gait               General Gait Details: NT  Stairs            Wheelchair Mobility     Tilt Bed    Modified Rankin (Stroke Patients Only)       Balance Overall balance assessment: Needs assistance Sitting-balance support: Feet supported Sitting balance-Leahy Scale: Good     Standing balance support: Bilateral upper extremity supported, During functional activity, Reliant on assistive device for balance Standing balance-Leahy Scale: Poor                               Pertinent Vitals/Pain Pain Assessment Pain Assessment: 0-10 Pain Score: 0-No pain Pain Location: R knee and LE Pain Descriptors / Indicators: Other (Comment) (no pain) Pain Intervention(s): Limited activity within patient's tolerance, Monitored during session, Ice applied    Home Living Family/patient expects to be discharged to:: Private residence Living Arrangements: Spouse/significant other Available Help at Discharge: Family Type of Home: House Home Access: Stairs to enter;Ramped entrance Entrance Stairs-Rails: None Entrance Stairs-Number of Steps: one  step once inside home and small ramp to enter Alternate Level Stairs-Number of Steps: flight Home Layout: Two level;Bed/bath upstairs Home Equipment: None      Prior Function Prior Level of Function : Independent/Modified Independent;Working/employed;Driving             Mobility Comments: IND no AD for all ADLs, self care tasks and IADLs ADLs  Comments: electrition     Extremity/Trunk Assessment        Lower Extremity Assessment Lower Extremity Assessment: RLE deficits/detail RLE Deficits / Details: ankle DF/PF 5/5; SLR > 10 degree lag RLE Sensation: history of peripheral neuropathy    Cervical / Trunk Assessment Cervical / Trunk Assessment: Neck Surgery  Communication   Communication Communication: No apparent difficulties    Cognition Arousal: Alert Behavior During Therapy: WFL for tasks assessed/performed   PT - Cognitive impairments: No apparent impairments                         Following commands: Intact       Cueing       General Comments      Exercises Total Joint Exercises Ankle Circles/Pumps: AROM, Both, 10 reps   Assessment/Plan    PT Assessment Patient needs continued PT services  PT Problem List Decreased strength;Decreased range of motion;Decreased activity tolerance;Decreased balance;Decreased mobility;Decreased coordination       PT Treatment Interventions DME instruction;Gait training;Stair training;Functional mobility training;Therapeutic activities;Therapeutic exercise;Balance training;Neuromuscular re-education;Patient/family education;Modalities    PT Goals (Current goals can be found in the Care Plan section)  Acute Rehab PT Goals Patient Stated Goal: to be able to get stronger and return to work PT Goal Formulation: With patient Time For Goal Achievement: 10/06/23 Potential to Achieve Goals: Good    Frequency 7X/week     Co-evaluation               AM-PAC PT 6 Clicks Mobility  Outcome Measure Help needed turning from your back to your side while in a flat bed without using bedrails?: None Help needed moving from lying on your back to sitting on the side of a flat bed without using bedrails?: A Little Help needed moving to and from a bed to a chair (including a wheelchair)?: A Little Help needed standing up from a chair using your arms (e.g.,  wheelchair or bedside chair)?: A Little Help needed to walk in hospital room?: Total Help needed climbing 3-5 steps with a railing? : Total 6 Click Score: 15    End of Session Equipment Utilized During Treatment: Gait belt Activity Tolerance: Patient tolerated treatment well;No increased pain Patient left: in chair;with call bell/phone within reach Nurse Communication: Mobility status PT Visit Diagnosis: Unsteadiness on feet (R26.81);Other abnormalities of gait and mobility (R26.89);Muscle weakness (generalized) (M62.81);Difficulty in walking, not elsewhere classified (R26.2)    Time: 8163-8098 PT Time Calculation (min) (ACUTE ONLY): 25 min   Charges:   PT Evaluation $PT Eval Low Complexity: 1 Low PT Treatments $Therapeutic Activity: 8-22 mins PT General Charges $$ ACUTE PT VISIT: 1 Visit         [place], PT Acute Rehab   [place] Fernando Pratt 09/22/2023, 7:19 PM

## 2023-09-22 NOTE — Interval H&P Note (Signed)
 History and Physical Interval Note: The patient understands that he is here today for right total knee replacement to treat his significant right knee pain and arthritis.  There has been no acute or interval change in his medical status.  The risks and benefits of surgery have been discussed in detail and informed consent has been obtained.  The right operative knee has been marked.  09/22/2023 11:06 AM  Fernando Pratt  has presented today for surgery, with the diagnosis of osteoarthritis right knee.  The various methods of treatment have been discussed with the patient and family. After consideration of risks, benefits and other options for treatment, the patient has consented to  Procedure(s): ARTHROPLASTY, KNEE, TOTAL (Right) as a surgical intervention.  The patient's history has been reviewed, patient examined, no change in status, stable for surgery.  I have reviewed the patient's chart and labs.  Questions were answered to the patient's satisfaction.     [name]

## 2023-09-22 NOTE — Op Note (Signed)
 Operative Note  Date of operation: 09/22/2023 Preoperative diagnosis: Right knee primary osteoarthritis Postoperative diagnosis: Same  Procedure: Right press-fit total knee arthroplasty  Implants: Biomet/Zimmer persona press-fit knee system Implant Name Type Inv. Item Serial No. Manufacturer Lot No. LRB No. Used Action  COMPONENT TIB KNEE PS G 0 RT - [id number] Joint COMPONENT TIB KNEE PS G 0 RT  ZIMMER RECON(ORTH,TRAU,BIO,SG) [id number] Right 1 Implanted  INSERT TIBIAL PERSONA 10 RT - [id number] Insert INSERT TIBIAL PERSONA 10 RT  ZIMMER RECON(ORTH,TRAU,BIO,SG) [id number] Right 1 Implanted  COMPONENT FEM CMT PS STD 11 RT - [id number] Joint COMPONENT FEM CMT PS STD 11 RT  ZIMMER RECON(ORTH,TRAU,BIO,SG) [id number] Right 1 Implanted  COMPONENT PATELLA PEG 3 32 - [id number] Joint COMPONENT PATELLA PEG 3 32  ZIMMER RECON(ORTH,TRAU,BIO,SG) [id number] Right 1 Implanted   Surgeon: Lonni GRADE. [place], MD Assistant: Tory Gaskins, PA-C  Anesthesia: #1 right lower extremity adductor canal block, #2 spinal, #3 local Antibiotics: IV Ancef  Tourniquet time: Less than 40 minutes EBL: Less than 50 cc Complications: None  Indications: The patient is a 78 year old gentleman with debilitating arthritis involving his right knee this been well-documented.  He has varus malalignment that knee and near bone-on-bone wear.  He has tried and failed all forms of conservative treatment.  At this point his right knee pain is daily and it is detrimentally affecting his mobility, his quality of life and his actives of daily living to the point he does wish to proceed with a knee replacement.  We did discuss the risks of acute blood loss anemia, nerve and vessel injury, fracture, infection, DVT, implant failure and wound healing issues.  He understands that our goals are hopefully decreased pain, and improved mobility and improved quality of life.  Procedure description: After informed consent was obtained and the appropriate  right knee was marked, anesthesia obtained a right lower extremity adductor canal block in the holding room.  The patient was then brought to the operating room and set up on the operating table where spinal anesthesia was obtained.  He was then laid in supine position on the operating table and a Foley catheter was placed.  A nonsterile tractors placed around his upper right thigh and his right thigh, knee, leg, and ankle were prepped and draped with DuraPrep and sterile drapes including a sterile stockinette.  A timeout was called and he was identified as the correct patient the correct right knee.  An Esmarch was then used to wrap out the leg and the tourniquet was inflated to 300 mm of pressure.  With the knee extended a direct midline incision was made over the patella and carried proximally distally.  Dissection was carried down to the knee joint and a medial parapatellar arthrotomy was made finding a moderate joint effusion.  With the knee in a flexed position we found complete cartilage wear the medial compartment the knee and patellofemoral joint as well as some cartilage wear laterally.  The ACL as well as medial lateral meniscus were removed.  Osteophytes removed from all 3 compartments.  We then used an extramedullary based cutting guide for making our proximal tibia cut setting this for right knee with a 7 degree slope taking 2 mm off the low side and correction of varus and valgus.  We made this cut without difficulty.  We then used an intramedullary cutting guide for distal femur cut setting this for a right knee at 5 degrees externally rotated for a 10 mm distal femoral cut.  We made that cut  without difficulty and brought the knee back down to full extension and had achieved full extension with a 10 mm extension block.  We then went back to the femur and put a femoral sizing guide based on the epicondylar axis.  Based off of this we chose a size 11 femur.  We put a 4-in-1 cutting block for a size 11  femur and made our anterior and posterior cuts followed by the chamfer cuts.  We then impacted the tibia and chose a size G right tibial tray for coverage over the tibial plateau setting the rotation of the tibial tubercle on the femur.  We did a drill hole and keel punch off of this and found excellent quality bone for press-fit implants.  We found this on the femur as well.  We then trialed our size G right tibia component followed by our size 11 right CR standard femur.  We trialed a 10 mm thickness right medial congruent polythene insert and we are pleased with range of motion and stability without insert.  We made our patella cut and drilled through holes for a size 32 press-fit patella button.  Again with all trial instrumentation the knee was replaced with range of motion and stability.  We then removed all trial instrumentation from the knee and irrigate the knee with normal saline solution using pulsatile lavage.  We then placed Marcaine  with epinephrine  around the arthrotomy.  We then dried the knee real well and with the knee in a flexed position we placed our Biomet/Zimmer press-fit tibial tray for right knee size G followed by press fitting our size 11 right CR standard femur.  We placed our 10 mm thickness right medial congruent polythene insert and press-fit our size 32 patella button.  Once again we are pleased with range of motion and stability with the real implants in place.  The tourniquet was then let down and hemostasis was obtained with electrocautery.  The arthrotomy was closed with interrupted #1 Vicryl suture followed by 0 Vicryl goes deep tissue and 2-0 Vicryl to close subcutaneous tissue.  The skin was closed with staples.  Well-padded sterile dressing was applied.  The patient was taken the recovery room.  Tory Gaskins, PA-C did assist during the entire case and beginning to end and his assistance was crucial and medically necessary for soft tissue management and retraction, helping out  implant placement and a layered closure of the wound.

## 2023-09-22 NOTE — Transfer of Care (Signed)
 Immediate Anesthesia Transfer of Care Note  Patient: Fernando Pratt  Procedure(s) Performed: ARTHROPLASTY, KNEE, TOTAL (Right: Knee)  Patient Location: PACU  Anesthesia Type:Spinal  Level of Consciousness: oriented, drowsy, and patient cooperative  Airway & Oxygen Therapy: Patient Spontanous Breathing  Post-op Assessment: Report given to RN and Post -op Vital signs reviewed and stable  Post vital signs: Reviewed and stable  Last Vitals:  Vitals Value Taken Time  BP 108/65 09/22/23 14:15  Temp 97.19F oral   Pulse 81 09/22/23 14:17  Resp 17 09/22/23 14:17  SpO2 95 % on RA 09/22/23 14:17  Vitals shown include unfiled device data.  Last Pain:  Vitals:   09/22/23 1218  TempSrc:   PainSc: 0-No pain         Complications: No notable events documented.

## 2023-09-22 NOTE — Anesthesia Procedure Notes (Signed)
 Anesthesia Regional Block: Adductor canal block   Pre-Anesthetic Checklist: , timeout performed,  Correct Patient, Correct Site, Correct Laterality,  Correct Procedure, Correct Position, site marked,  Risks and benefits discussed,  Surgical consent,  Pre-op  evaluation,  At surgeon's request and post-op pain management  Laterality: Lower and Right  Prep: chloraprep       Needles:  Injection technique: Single-shot  Needle Type: Stimiplex     Needle Length: 9cm  Needle Gauge: 21     Additional Needles:   Procedures:,,,, ultrasound used (permanent image in chart),,    Narrative:  Start time: 09/22/2023 12:00 PM End time: 09/22/2023 12:14 PM Injection made incrementally with aspirations every 5 mL.  Performed by: Personally  Anesthesiologist: Darlyn Rush, MD  Additional Notes: BP cuff, EKG monitors applied. Sedation begun. Artery and nerve location verified with ultrasound. Anesthetic injected incrementally (5ml), slowly, and after negative aspirations under direct u/s guidance. Good fascial/perineural spread. Tolerated well.

## 2023-09-23 DIAGNOSIS — Z96651 Presence of right artificial knee joint: Secondary | ICD-10-CM | POA: Diagnosis not present

## 2023-09-23 DIAGNOSIS — M1711 Unilateral primary osteoarthritis, right knee: Secondary | ICD-10-CM | POA: Diagnosis not present

## 2023-09-23 LAB — CBC
HCT: 40.2 % (ref 39.0–52.0)
Hemoglobin: 12.9 g/dL — ABNORMAL LOW (ref 13.0–17.0)
MCH: 29.9 pg (ref 26.0–34.0)
MCHC: 32.1 g/dL (ref 30.0–36.0)
MCV: 93.1 fL (ref 80.0–100.0)
Platelets: 127 K/uL — ABNORMAL LOW (ref 150–400)
RBC: 4.32 MIL/uL (ref 4.22–5.81)
RDW: 12.4 % (ref 11.5–15.5)
WBC: 13.7 K/uL — ABNORMAL HIGH (ref 4.0–10.5)
nRBC: 0 % (ref 0.0–0.2)

## 2023-09-23 LAB — BASIC METABOLIC PANEL WITH GFR
Anion gap: 12 (ref 5–15)
BUN: 23 mg/dL (ref 8–23)
CO2: 24 mmol/L (ref 22–32)
Calcium: 8.5 mg/dL — ABNORMAL LOW (ref 8.9–10.3)
Chloride: 104 mmol/L (ref 98–111)
Creatinine, Ser: 1.23 mg/dL (ref 0.61–1.24)
GFR, Estimated: >60 mL/min (ref >60)
Glucose, Bld: 156 mg/dL — ABNORMAL HIGH (ref 70–99)
Potassium: 4.3 mmol/L (ref 3.5–5.1)
Sodium: 140 mmol/L (ref 135–145)

## 2023-09-23 MED ORDER — ASPIRIN 81 MG PO CHEW: 81.0000 mg | CHEWABLE_TABLET | Freq: Two times a day (BID) | ORAL | 0 refills | Status: AC

## 2023-09-23 MED ORDER — ONDANSETRON 4 MG PO TBDP: 4.0000 mg | ORAL_TABLET | Freq: Three times a day (TID) | ORAL | 0 refills | Status: DC | PRN

## 2023-09-23 MED ORDER — OXYCODONE HCL 5 MG PO TABS: 5.0000 mg | ORAL_TABLET | Freq: Four times a day (QID) | ORAL | 0 refills | Status: DC | PRN

## 2023-09-23 MED ORDER — METHOCARBAMOL 500 MG PO TABS: 500.0000 mg | ORAL_TABLET | Freq: Four times a day (QID) | ORAL | 1 refills | Status: AC | PRN

## 2023-09-23 NOTE — Progress Notes (Signed)
 Provided discharge education/instructions, all questions answered. Pt is not in any distress. RW delivered to room. Pt discharged home with all of his belongings accompanied by wife.

## 2023-09-23 NOTE — Care Management Obs Status (Signed)
 MEDICARE OBSERVATION STATUS NOTIFICATION   Patient Details  Name: Fernando Pratt MRN: [id number] Date of Birth: Apr 25, 1945   Medicare Observation Status Notification Given:  Yes    Duwaine GORMAN Aran, LCSW 09/23/2023, 9:20 AM

## 2023-09-23 NOTE — TOC Transition Note (Signed)
 Transition of Care [facility]) - Discharge Note  Patient Details  Name: Fernando Pratt MRN: [id number] Date of Birth: 1945/07/22  [facility] CM/SW Contact:  Duwaine GORMAN Aran, LCSW Phone Number: 09/23/2023, 9:26 AM  Clinical Narrative: CSW met with patient to review discharge plan and needs. Patient will go home with HHPT, which was prearranged with [facility]. Patient will need a rolling walker and patient is agreeable to [employer] delivering RW to room. CSW made DME referral to [facility] with [employer]. [employer] to deliver rolling walker to room. Care management signing off.  Final next level of care: Home w Home Health Services Barriers to Discharge: No Barriers Identified  Patient Goals and CMS Choice Patient states their goals for this hospitalization and ongoing recovery are:: Go home with HHPT CMS Medicare.gov Compare Post Acute Care list provided to:: Patient Choice offered to / list presented to : Patient  Discharge Plan and Services Additional resources added to the After Visit Summary for         DME Arranged: Walker rolling DME Agency: [employer] Date DME Agency Contacted: 09/23/23 Time DME Agency Contacted: [phone] Representative spoke with at DME Agency: [facility] HH Arranged: PT HH Agency: [facility] Representative spoke with at [facility] Agency: Prearranged in orthopedist's office  Social Drivers of Health (SDOH) Interventions SDOH Screenings   Food Insecurity: No Food Insecurity (09/22/2023)  Housing: Low Risk  (09/22/2023)  Transportation Needs: No Transportation Needs (09/22/2023)  Utilities: Not At Risk (09/22/2023)  Depression (PHQ2-9): Low Risk  (11/29/2022)  Financial Resource Strain: Low Risk  (08/10/2017)  Physical Activity: Inactive (08/10/2017)  Social Connections: Socially Isolated (09/22/2023)  Stress: No Stress Concern Present (08/10/2017)  Tobacco Use: Medium Risk (09/22/2023)   Readmission Risk Interventions     No data to display

## 2023-09-23 NOTE — Progress Notes (Signed)
 Subjective: 1 Day Post-Op Procedure(s) (LRB): ARTHROPLASTY, KNEE, TOTAL (Right) Patient reports pain as moderate.  Some nausea this morning.  Objective: Vital signs in last 24 hours: Temp:  [97.8 F (36.6 C)-98.4 F (36.9 C)] 98.2 F (36.8 C) (09/20 0450) Pulse Rate:  [66-97] 91 (09/20 0450) Resp:  [12-21] 17 (09/20 0450) BP: (93-163)/(60-97) 140/97 (09/20 0450) SpO2:  [92 %-100 %] 98 % (09/20 0450) Weight:  [109.3 kg] 109.3 kg (09/19 1606)  Intake/Output from previous day: 09/19 0701 - 09/20 0700 In: 2500 [P.O.:600; I.V.:1500; IV Piggyback:400] Out: 8274 [Lmpwz:8324; Blood:50] Intake/Output this shift: Total I/O In: 240 [P.O.:240] Out: -   Recent Labs    09/23/23 0315  HGB 12.9*   Recent Labs    09/23/23 0315  WBC 13.7*  RBC 4.32  HCT 40.2  PLT 127*   Recent Labs    09/23/23 0315  NA 140  K 4.3  CL 104  CO2 24  BUN 23  CREATININE 1.23  GLUCOSE 156*  CALCIUM  8.5*   No results for input(s): LABPT, INR in the last 72 hours.  Sensation intact distally Intact pulses distally Dorsiflexion/Plantar flexion intact Incision: dressing C/D/I Compartment soft   Assessment/Plan: 1 Day Post-Op Procedure(s) (LRB): ARTHROPLASTY, KNEE, TOTAL (Right) Up with therapy Discharge home with home health this afternoon.      Fernando Pratt 09/23/2023, 9:49 AM

## 2023-09-23 NOTE — Progress Notes (Signed)
 Physical Therapy Treatment Patient Details Name: THEUS ESPIN MRN: [id number] DOB: 07-24-1945 Today's Date: 09/23/2023   History of Present Illness      PT Comments  Pt agreeable to working with therapy. He was able to ambulate ~75 feet with a RW. Session limited by nausea and R thigh pain. He is hopeful to d/c home later today. Will plan to have a 2nd session.     If plan is discharge home, recommend the following: A little help with walking and/or transfers;A little help with bathing/dressing/bathroom;Assistance with cooking/housework;Assist for transportation;Help with stairs or ramp for entrance   Can travel by private vehicle        Equipment Recommendations  Rolling walker (2 wheels)    Recommendations for Other Services       Precautions / Restrictions Precautions Precautions: Fall;Knee Restrictions Weight Bearing Restrictions Per Provider Order: No Other Position/Activity Restrictions: WBAT     Mobility  Bed Mobility               General bed mobility comments: oob in recliner    Transfers Overall transfer level: Needs assistance Equipment used: Rolling walker (2 wheels) Transfers: Sit to/from Stand Sit to Stand: Contact guard assist           General transfer comment: Increased time. Cues for safety, technique, hand/LE placement.    Ambulation/Gait Ambulation/Gait assistance: Contact guard assist Gait Distance (Feet): 75 Feet Assistive device: Rolling walker (2 wheels) Gait Pattern/deviations: Step-to pattern, Antalgic       General Gait Details: Cues for safety, technique, sequencing, proper use of RW. Slow gait speed. Pt reported nausea while ambulating   Stairs             Wheelchair Mobility     Tilt Bed    Modified Rankin (Stroke Patients Only)       Balance Overall balance assessment: Needs assistance         Standing balance support: Bilateral upper extremity supported, During functional activity, Reliant on  assistive device for balance Standing balance-Leahy Scale: Fair                              Hotel manager: No apparent difficulties  Cognition Arousal: Alert Behavior During Therapy: WFL for tasks assessed/performed   PT - Cognitive impairments: No apparent impairments                         Following commands: Intact      Cueing Cueing Techniques: Verbal cues  Exercises Total Joint Exercises Ankle Circles/Pumps: AROM, Both, 10 reps Quad Sets: AROM, Both, 10 reps Heel Slides: AAROM, Right, 10 reps Straight Leg Raises: AROM, Right, 10 reps Goniometric ROM: ~10-60 degrees    General Comments        Pertinent Vitals/Pain Pain Assessment Pain Assessment: 0-10 Pain Score: 5  Pain Location: R thigh Pain Descriptors / Indicators: Aching, Tightness, Sore Pain Intervention(s): Limited activity within patient's tolerance, Monitored during session, Ice applied, Repositioned    Home Living                          Prior Function            PT Goals (current goals can now be found in the care plan section) Progress towards PT goals: Progressing toward goals    Frequency    7X/week  PT Plan      Co-evaluation              AM-PAC PT 6 Clicks Mobility   Outcome Measure  Help needed turning from your back to your side while in a flat bed without using bedrails?: A Little Help needed moving from lying on your back to sitting on the side of a flat bed without using bedrails?: A Little Help needed moving to and from a bed to a chair (including a wheelchair)?: A Little Help needed standing up from a chair using your arms (e.g., wheelchair or bedside chair)?: A Little Help needed to walk in hospital room?: A Little Help needed climbing 3-5 steps with a railing? : A Lot 6 Click Score: 17    End of Session Equipment Utilized During Treatment: Gait belt Activity Tolerance: Patient tolerated  treatment well;Patient limited by pain (limited by nausea) Patient left: in chair;with call bell/phone within reach;with chair alarm set   PT Visit Diagnosis: Unsteadiness on feet (R26.81);Other abnormalities of gait and mobility (R26.89);Muscle weakness (generalized) (M62.81);Difficulty in walking, not elsewhere classified (R26.2)     Time: 8957-8891 PT Time Calculation (min) (ACUTE ONLY): 26 min  Charges:    $Gait Training: 8-22 mins $Therapeutic Exercise: 8-22 mins PT General Charges $$ ACUTE PT VISIT: 1 Visit                         Dannial SQUIBB, PT Acute Rehabilitation  Office: [phone]

## 2023-09-23 NOTE — Progress Notes (Signed)
 Physical Therapy Treatment Patient Details Name: Fernando Pratt MRN: [id number] DOB: Feb 20, 1945 Today's Date: 09/23/2023   History of Present Illness 78 yo male presents to therapy s/p R TKA on 09/22/2023 due to failure of conservative measures. Pt PMH includes but is not limited to: OA, HLD, neuropathy, and neck surgery.    PT Comments  2nd session to practice stair negotiation. Pt practiced 1 step x 2. He plans on using ramp to enter home. He also reports having 1 flight of stairs up to bedroom. He states he will sleep on 1st level for a couple of days.Offered to practice stairs with pt-he politely declined-verbally reviewed proper technique with pt. Encouraged him to practice with HHPT-pt agreeable. Encouraged him to mobilize as tolerated with assistance from his caregiver-pt agreeable. No further questions/concerns from patient. He is eager to d/c home on today.     If plan is discharge home, recommend the following: A little help with walking and/or transfers;A little help with bathing/dressing/bathroom;Assistance with cooking/housework;Assist for transportation;Help with stairs or ramp for entrance   Can travel by private vehicle        Equipment Recommendations  Rolling walker (2 wheels)    Recommendations for Other Services       Precautions / Restrictions Precautions Precautions: Fall;Knee Restrictions Weight Bearing Restrictions Per Provider Order: No Other Position/Activity Restrictions: WBAT     Mobility  Bed Mobility               General bed mobility comments: oob in recliner    Transfers Overall transfer level: Needs assistance Equipment used: Rolling walker (2 wheels) Transfers: Sit to/from Stand Sit to Stand: Min assist           General transfer comment: Small steadying assist needed. Cues for safety, technique, hand/LE placement    Ambulation/Gait Ambulation/Gait assistance: Contact guard assist Gait Distance (Feet): 75 Feet Assistive  device: Rolling walker (2 wheels) Gait Pattern/deviations: Step-to pattern, Antalgic       General Gait Details: Cues for safety, technique, sequencing, proper use of RW. Slow gait speed. Reports feeling better compared to this morning.   Stairs Stairs: Yes Stairs assistance: Min assist   Number of Stairs: 1 General stair comments: Practiced 1 step x 2. Cues for safety, technique, sequencing. Assist to steady/manage RW.   Wheelchair Mobility     Tilt Bed    Modified Rankin (Stroke Patients Only)       Balance Overall balance assessment: Needs assistance         Standing balance support: Bilateral upper extremity supported, During functional activity, Reliant on assistive device for balance Standing balance-Leahy Scale: Fair                              Hotel manager: No apparent difficulties  Cognition Arousal: Alert Behavior During Therapy: WFL for tasks assessed/performed   PT - Cognitive impairments: No apparent impairments                         Following commands: Intact      Cueing Cueing Techniques: Verbal cues  Exercises     General Comments        Pertinent Vitals/Pain Pain Assessment Pain Assessment: Faces Pain Score: 5  Faces Pain Scale: Hurts even more Pain Location: R thigh Pain Descriptors / Indicators: Aching, Tightness, Sore Pain Intervention(s): Monitored during session, Repositioned, Ice applied    Home Living  Prior Function            PT Goals (current goals can now be found in the care plan section) Progress towards PT goals: Progressing toward goals    Frequency    7X/week      PT Plan      Co-evaluation              AM-PAC PT 6 Clicks Mobility   Outcome Measure  Help needed turning from your back to your side while in a flat bed without using bedrails?: A Little Help needed moving from lying on your back to  sitting on the side of a flat bed without using bedrails?: A Little Help needed moving to and from a bed to a chair (including a wheelchair)?: A Little Help needed standing up from a chair using your arms (e.g., wheelchair or bedside chair)?: A Little Help needed to walk in hospital room?: A Little Help needed climbing 3-5 steps with a railing? : A Little 6 Click Score: 18    End of Session Equipment Utilized During Treatment: Gait belt Activity Tolerance: Patient tolerated treatment well Patient left: in chair;with call bell/phone within reach   PT Visit Diagnosis: Unsteadiness on feet (R26.81);Other abnormalities of gait and mobility (R26.89);Muscle weakness (generalized) (M62.81);Difficulty in walking, not elsewhere classified (R26.2)     Time: 8664-8644 PT Time Calculation (min) (ACUTE ONLY): 20 min  Charges:    $Gait Training: 8-22 mins $Therapeutic Exercise: 8-22 mins PT General Charges $$ ACUTE PT VISIT: 1 Visit                        Dannial SQUIBB, PT Acute Rehabilitation  Office: [phone]

## 2023-09-23 NOTE — Discharge Instructions (Signed)

## 2023-09-24 DIAGNOSIS — Z791 Long term (current) use of non-steroidal anti-inflammatories (NSAID): Secondary | ICD-10-CM | POA: Diagnosis not present

## 2023-09-24 DIAGNOSIS — E785 Hyperlipidemia, unspecified: Secondary | ICD-10-CM | POA: Diagnosis not present

## 2023-09-24 DIAGNOSIS — R32 Unspecified urinary incontinence: Secondary | ICD-10-CM | POA: Diagnosis not present

## 2023-09-24 DIAGNOSIS — Z7982 Long term (current) use of aspirin: Secondary | ICD-10-CM | POA: Diagnosis not present

## 2023-09-24 DIAGNOSIS — M4712 Other spondylosis with myelopathy, cervical region: Secondary | ICD-10-CM | POA: Diagnosis not present

## 2023-09-24 DIAGNOSIS — Z471 Aftercare following joint replacement surgery: Secondary | ICD-10-CM | POA: Diagnosis not present

## 2023-09-24 DIAGNOSIS — Z96651 Presence of right artificial knee joint: Secondary | ICD-10-CM | POA: Diagnosis not present

## 2023-09-24 DIAGNOSIS — G629 Polyneuropathy, unspecified: Secondary | ICD-10-CM | POA: Diagnosis not present

## 2023-09-24 DIAGNOSIS — E559 Vitamin D deficiency, unspecified: Secondary | ICD-10-CM | POA: Diagnosis not present

## 2023-09-24 DIAGNOSIS — Z87891 Personal history of nicotine dependence: Secondary | ICD-10-CM | POA: Diagnosis not present

## 2023-09-24 DIAGNOSIS — K5900 Constipation, unspecified: Secondary | ICD-10-CM | POA: Diagnosis not present

## 2023-09-24 NOTE — Discharge Summary (Signed)
 Patient ID: Fernando Pratt MRN: [id number] DOB/AGE: November 06, 1945 78 y.o.  Admit date: 09/22/2023 Discharge date: 09/23/2023  Admission Diagnoses:  Principal Problem:   Unilateral primary osteoarthritis, right knee Active Problems:   Status post total right knee replacement   Discharge Diagnoses:  Same  Past Medical History:  Diagnosis Date   Arthritis    Complication of anesthesia    slow to wake up after neck surgery   Hyperlipidemia     Surgeries: Procedure(s): ARTHROPLASTY, KNEE, TOTAL on 09/22/2023   Consultants:   Discharged Condition: Improved  Hospital Course: Fernando Pratt is an 78 y.o. male who was admitted 09/22/2023 for operative treatment ofUnilateral primary osteoarthritis, right knee. Patient has severe unremitting pain that affects sleep, daily activities, and work/hobbies. After pre-op  clearance the patient was taken to the operating room on 09/22/2023 and underwent  Procedure(s): ARTHROPLASTY, KNEE, TOTAL.    Patient was given perioperative antibiotics:  Anti-infectives (From admission, onward)    Start     Dose/Rate Route Frequency Ordered Stop   09/22/23 1900  ceFAZolin  (ANCEF ) IVPB 2g/100 mL premix        2 g 200 mL/hr over 30 Minutes Intravenous Every 6 hours 09/22/23 1537 09/23/23 0200   09/22/23 1000  ceFAZolin  (ANCEF ) IVPB 2g/100 mL premix        2 g 200 mL/hr over 30 Minutes Intravenous On call to O.R. 09/22/23 0956 09/22/23 1245        Patient was given sequential compression devices, early ambulation, and chemoprophylaxis to prevent DVT.  Inpatient Morphine Milligram Equivalents Per Day 9/19 - 9/20   Values displayed are in units of MME/Day    Order Start / End Date 9/19 Yesterday    oxyCODONE  (Oxy IR/ROXICODONE ) immediate release tablet 5 mg 9/19 - 9/19 7.5 of Unknown --    oxyCODONE  (ROXICODONE ) 5 MG/5ML solution 5 mg 9/19 - 9/19 0 of Unknown --      Group total: 7.5 of Unknown     fentaNYL  (SUBLIMAZE ) injection 50-100 mcg 9/19 - 9/19  15 of 15-30 --    fentaNYL  (SUBLIMAZE ) injection 9/19 - 9/19 *30 of 30 --    HYDROmorphone  (DILAUDID ) injection 0.5-1 mg 9/19 - 9/20 0 of 30-60 0 of 50-100    oxyCODONE  (Oxy IR/ROXICODONE ) immediate release tablet 5-10 mg 9/19 - 9/20 7.5 of 22.5-45 22.5 of 37.5-75    oxyCODONE  (Oxy IR/ROXICODONE ) immediate release tablet 10-15 mg 9/19 - 9/20 0 of 45-67.5 0 of 75-112.5    fentaNYL  (SUBLIMAZE ) injection 25-50 mcg 9/19 - 9/19 0 of 45-90 --    Daily Totals  * 60 of Unknown (at least 187.5-322.5) 22.5 of 162.5-287.5  *One-Step medication  Calculation Errors     Order Type Date Details   oxyCODONE  (Oxy IR/ROXICODONE ) immediate release tablet 5 mg Ordered Dose -- Insufficient frequency information   oxyCODONE  (ROXICODONE ) 5 MG/5ML solution 5 mg Ordered Dose -- Insufficient frequency information            Patient benefited maximally from hospital stay and there were no complications.    Recent vital signs: No data found.   Recent laboratory studies:  Recent Labs    09/23/23 0315  WBC 13.7*  HGB 12.9*  HCT 40.2  PLT 127*  NA 140  K 4.3  CL 104  CO2 24  BUN 23  CREATININE 1.23  GLUCOSE 156*  CALCIUM  8.5*     Discharge Medications:   Allergies as of 09/23/2023   No Known Allergies  Medication List     TAKE these medications    aspirin  81 MG chewable tablet Chew 1 tablet (81 mg total) by mouth 2 (two) times daily.   celecoxib  400 MG capsule Commonly known as: CeleBREX  Take 1 capsule (400 mg total) by mouth daily. With food   methocarbamol  500 MG tablet Commonly known as: ROBAXIN  Take 1 tablet (500 mg total) by mouth every 6 (six) hours as needed for muscle spasms.   ondansetron  4 MG disintegrating tablet Commonly known as: ZOFRAN -ODT Take 1 tablet (4 mg total) by mouth every 8 (eight) hours as needed.   oxyCODONE  5 MG immediate release tablet Commonly known as: Oxy IR/ROXICODONE  Take 1-2 tablets (5-10 mg total) by mouth every 6 (six) hours as needed for  moderate pain (pain score 4-6) (pain score 4-6). No more than 6 tablets per day.        Diagnostic Studies: DG Knee Right Port Result Date: 09/22/2023 EXAM: 1 or 2 VIEW(S) XRAY OF THE RIGHT KNEE 09/22/2023 04:23:00 PM COMPARISON: Knee radiograph 04/19/2022 and femur radiograph 02/06/2023. CLINICAL HISTORY: Status post total right knee replacement. FINDINGS: BONES AND JOINTS: Right total knee arthroplasty with well-seated prosthetic components. Partially visualized sclerotic lesion in the distal femoral metadiaphysis, incompletely assessed. Small postoperative joint effusion. SOFT TISSUES: Anterior skin staples noted. Expected soft tissue swelling and gas. IMPRESSION: 1. Right total knee arthroplasty with expected postoperative appearance. 2. Partially visualized sclerotic lesion in the distal femoral metadiaphysis, incompletely assessed. Electronically signed by: Donnice Mania MD 09/22/2023 06:26 PM EDT RP Workstation: [id number]    Disposition: Discharge disposition: 01-Home or Self Care          Follow-up Information     Vernetta Lonni GRADE, MD Follow up in 2 week(s).   Specialty: Orthopedic Surgery Contact information: [address] [place] [address] [phone]         [facility] Follow up.   Specialty: Home Health Services Why: [facility] will provide PT in the home after discharge. Contact information: [address] [place] [address] [phone]                  Signed: Lonni GRADE Vernetta 09/24/2023, 7:17 PM

## 2023-09-25 ENCOUNTER — Telehealth: Payer: Self-pay

## 2023-09-25 ENCOUNTER — Other Ambulatory Visit: Payer: Self-pay | Admitting: *Deleted

## 2023-09-25 ENCOUNTER — Encounter (HOSPITAL_COMMUNITY): Payer: Self-pay | Admitting: Orthopaedic Surgery

## 2023-09-25 ENCOUNTER — Telehealth: Payer: Self-pay | Admitting: Radiology

## 2023-09-25 DIAGNOSIS — Z96651 Presence of right artificial knee joint: Secondary | ICD-10-CM

## 2023-09-25 DIAGNOSIS — M1711 Unilateral primary osteoarthritis, right knee: Secondary | ICD-10-CM

## 2023-09-25 NOTE — Telephone Encounter (Signed)
 Fernando Pratt w/ [facility]   [phone] called needing verbal order for this patient.  Homehealth PT   3 x for 2 wks

## 2023-09-25 NOTE — Telephone Encounter (Signed)
 Hadassah Abrahams with [facility] called asking for a verbal order for this patient.    ** gave ok for verbals

## 2023-09-25 NOTE — Telephone Encounter (Signed)
 done

## 2023-09-27 ENCOUNTER — Telehealth: Payer: Self-pay

## 2023-09-27 NOTE — Telephone Encounter (Signed)
 [facility] called and left message on triage phone. Patients bandage is 75% soaked per therapist. She wants to know patient should do? Come here for new dressing or have them change it? Therapist- [phone]

## 2023-09-28 NOTE — Telephone Encounter (Signed)
 Called spoke with therapist.

## 2023-10-05 ENCOUNTER — Encounter: Payer: Self-pay | Admitting: Orthopaedic Surgery

## 2023-10-05 ENCOUNTER — Ambulatory Visit: Admitting: Orthopaedic Surgery

## 2023-10-05 DIAGNOSIS — Z96651 Presence of right artificial knee joint: Secondary | ICD-10-CM

## 2023-10-05 NOTE — Progress Notes (Signed)
 HPI: Fernando Pratt comes in today 2 weeks status post right total knee arthroplasty.  He is overall doing well.  Has no complaints.  He states his pain is minimal and he is now taking a pain medication.  He is taking aspirin  twice daily for DVT prophylaxis.  No fevers chills.  Review of systems: See HPI otherwise negative  Physical exam: General Well-developed well-nourished male who ambulates with a walker. Psych: Alert and oriented x 3 Right knee: Lacks a few degrees in full extension.  Flexing to approximately 100 degrees.  Calf supple nontender.  Dorsiflexion plantarflexion right ankle intact.  Impression: Status post right total knee arthroplasty 09/22/2023  Plan: He will continue work on range of motion and strengthening.  He has physical therapy outpatient starting tomorrow.  He will continue to take aspirin  once daily for a week and then discontinue as he was on no aspirin  prior to surgery.  Staples were harvested Steri-Strips applied.  Questions were encouraged and answered.

## 2023-10-06 ENCOUNTER — Ambulatory Visit: Attending: Orthopaedic Surgery | Admitting: Physical Therapy

## 2023-10-06 ENCOUNTER — Other Ambulatory Visit: Payer: Self-pay

## 2023-10-06 DIAGNOSIS — M6281 Muscle weakness (generalized): Secondary | ICD-10-CM | POA: Insufficient documentation

## 2023-10-06 DIAGNOSIS — M1711 Unilateral primary osteoarthritis, right knee: Secondary | ICD-10-CM | POA: Diagnosis not present

## 2023-10-06 DIAGNOSIS — Z96651 Presence of right artificial knee joint: Secondary | ICD-10-CM | POA: Insufficient documentation

## 2023-10-06 DIAGNOSIS — M25561 Pain in right knee: Secondary | ICD-10-CM | POA: Diagnosis not present

## 2023-10-06 DIAGNOSIS — M25661 Stiffness of right knee, not elsewhere classified: Secondary | ICD-10-CM | POA: Insufficient documentation

## 2023-10-06 DIAGNOSIS — R2689 Other abnormalities of gait and mobility: Secondary | ICD-10-CM | POA: Insufficient documentation

## 2023-10-06 NOTE — Therapy (Signed)
 OUTPATIENT PHYSICAL THERAPY LOWER EXTREMITY EVALUATION   Patient Name: Fernando Pratt MRN: [id number] DOB:April 30, 1945, 78 y.o., male Today's Date: 10/06/2023  END OF SESSION:  PT End of Session - 10/06/23 0926     Visit Number 1    Number of Visits 16    Date for Recertification  12/01/23    Authorization Type [employer]    PT Start Time 0930    PT Stop Time 1000    PT Time Calculation (min) 30 min    Activity Tolerance Patient tolerated treatment well          Past Medical History:  Diagnosis Date   Arthritis    Complication of anesthesia    slow to wake up after neck surgery   Hyperlipidemia    Past Surgical History:  Procedure Laterality Date   COLONOSCOPY     NECK SURGERY  01/03/1989   TOTAL KNEE ARTHROPLASTY Right 09/22/2023   Procedure: ARTHROPLASTY, KNEE, TOTAL;  Surgeon: Vernetta Lonni GRADE, MD;  Location: [facility];  Service: Orthopedics;  Laterality: Right;   Patient Active Problem List   Diagnosis Date Noted   Status post total right knee replacement 09/22/2023   Unilateral primary osteoarthritis, right knee 09/21/2023   Spondylosis, cervical, with myelopathy 09/22/2015   Pain in joint of left shoulder 09/22/2015   Hyperlipidemia 09/22/2015   Neuropathy 09/22/2015   Vitamin D  deficiency 09/22/2015    PCP: Zollie Lowers, MD  REFERRING PROVIDER: Vernetta Lonni GRADE, MD  REFERRING DIAG: M17.11 (ICD-10-CM) - Unilateral primary osteoarthritis, right knee Z96.651 (ICD-10-CM) - Status post total right knee replacement  THERAPY DIAG:  Stiffness of right knee, not elsewhere classified  Acute pain of right knee  Muscle weakness (generalized)  Other abnormalities of gait and mobility  Rationale for Evaluation and Treatment: Rehabilitation  ONSET DATE: 09/22/23 surgery date  SUBJECTIVE:   SUBJECTIVE STATEMENT: Pt states his knee has been doing good. Pt just finished HHPT this past Wednesday. Pt feels it is slowly getting better. Has  been able to walk for about 15 min. Has been able to do stairs normally. Has been using RW at home. Today is the first time he's using the cane out of the house. Got staples removed yesterday.   PERTINENT HISTORY: History of neck surgery, R achilles injury  PAIN:  Are you having pain? Yes: NPRS scale: 1-2 constant Pain location: Along R knee joint line Pain description: throbbing Aggravating factors: Exercise Relieving factors: Ice   PRECAUTIONS: None  RED FLAGS: None   WEIGHT BEARING RESTRICTIONS: No  FALLS:  Has patient fallen in last 6 months? 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  DIAGNOSTIC FINDINGS: n/a   PATIENT SURVEYS:  LEFS  Extreme difficulty/unable (0), Quite a bit of difficulty (1), Moderate difficulty (2), Little difficulty (3), No difficulty (4) Survey date:  10/06/23  Any of your usual work, housework or school activities 3  2. Usual hobbies, recreational or sporting activities 2  3. Getting into/out of the bath 4  4. Walking between rooms 4  5. Putting on socks/shoes 2  6. Squatting  1  7. Lifting an object, like a bag of groceries from the floor 2  8. Performing light activities around your home 2  9. Performing heavy activities around your home 2  10. Getting into/out of a car 2  11. Walking 2 blocks 2  12. Walking 1 mile 1  13. Going up/down 10 stairs (1 flight) 3  14. Standing for 1 hour 2  15.  sitting for 1 hour 2  16. Running on even ground 0  17. Running on uneven ground 0  18. Making  sharp turns while running fast 0  19. Hopping  0  20. Rolling over in bed 2  Score total:  36/80     COGNITION: Overall cognitive status: Within functional limits for tasks assessed     SENSATION: WFL  EDEMA:  Mild edema  MUSCLE LENGTH: Did not assess -- see knee ROM  POSTURE: flexed trunk   PALPATION: No unexpected tenderness to palpation  LOWER EXTREMITY ROM:  Active/Passive ROM Right eval Left eval  Hip flexion    Hip extension    Hip abduction    Hip adduction    Hip internal rotation    Hip external rotation    Knee flexion 95/102 127/127  Knee extension -20/-9 0/0  Ankle dorsiflexion    Ankle plantarflexion    Ankle inversion    Ankle eversion     (Blank rows = not tested)  LOWER EXTREMITY MMT:  MMT Right eval Left eval  Hip flexion 4+ 5  Hip extension 4- 4  Hip abduction 3+ 4  Hip adduction    Hip internal rotation    Hip external rotation    Knee flexion 4+ 5  Knee extension 4+ 5  Ankle dorsiflexion    Ankle plantarflexion    Ankle inversion    Ankle eversion     (Blank rows = not tested)  LOWER EXTREMITY SPECIAL TESTS:  Did not assess  FUNCTIONAL TESTS:  5 times sit to stand: 13.78 sec with increased weight shift to L LE but no UE support SLS: L 43.15 sec, R 4.16 sec  GAIT: Distance walked: ~100' Assistive device utilized: tri tip cane Level of assistance: SBA Comments: R LE slightly externally rotated, R knee slightly flexed throughout gait, cane on L, diminished heel strike and hip ext during stance                                                                                                                                TREATMENT DATE:  10/06/23 Education only   PATIENT EDUCATION:  Education details: POC, bringing in HHPT HEP so we can update it accordingly, exam findings Person educated: Patient and Spouse Education method: Explanation, Demonstration, and Handouts Education comprehension: verbalized understanding,  returned demonstration, and needs further education  HOME EXERCISE PROGRAM: To be assessed  ASSESSMENT:  CLINICAL IMPRESSION: Patient is a 78 y.o. M who was seen today  for physical therapy evaluation and treatment for R TKA on 09/22/23. Pt has received HHPT and is now ready to begin OPPT. PMH is significant for R achilles injury and neck surgery. Assessment demos pain, reduced R knee ROM, weakness, and decreased balance affecting home and community mobility. Pt will benefit from PT to address these deficits to maximize his level of function post op.   OBJECTIVE IMPAIRMENTS: Abnormal gait, decreased activity tolerance, decreased balance, decreased endurance, decreased mobility, difficulty walking, decreased ROM, decreased strength, hypomobility, increased edema, increased fascial restrictions, increased muscle spasms, improper body mechanics, postural dysfunction, and pain.   ACTIVITY LIMITATIONS: carrying, lifting, bending, sitting, standing, squatting, sleeping, stairs, transfers, bed mobility, bathing, toileting, hygiene/grooming, and locomotion level  PARTICIPATION LIMITATIONS: meal prep, cleaning, driving, shopping, community activity, occupation, and yard work  PERSONAL FACTORS: Age, Fitness, Past/current experiences, and Time since onset of injury/illness/exacerbation are also affecting patient's functional outcome.   REHAB POTENTIAL: Good  CLINICAL DECISION MAKING: Evolving/moderate complexity  EVALUATION COMPLEXITY: Moderate   GOALS: Goals reviewed with patient? Yes  SHORT TERM GOALS: Target date: 11/03/2023  Patient will be independent with initial HEP. Baseline: From HHPT Goal status: INITIAL   LONG TERM GOALS: Target date: 12/01/2023   Patient will be independent with advanced/ongoing HEP to improve outcomes and carryover.  Baseline: Not yet provided Goal status: INITIAL  2.  Patient will report at least 75% improvement in R knee pain to improve QOL. Baseline:  1-2 Goal status: INITIAL  3.  Patient will demonstrate improved R knee AROM to >/= 5-120 deg to allow for normal gait and stair mechanics. Baseline: 20-95 AROM Goal status: INITIAL  4.  Patient will demonstrate improved functional LE strength as demonstrated by 5 x STS of </= 10 sec. Baseline: 13 Goal status: INITIAL  5.  Patient will be able to ambulate 600' with LRAD and normal gait pattern without increased pain to access community.  Baseline: 100' with SPC Goal status: INITIAL  6. Patient will be able to ascend/descend stairs without HR using reciprocal step pattern safely to access home and community.  Baseline: Able to ascend/descend 4 steps with 1 HR and cane reciprocally Goal status: INITIAL  7.  Patient will report 48 on LEFS (patient reported outcome measure) to demonstrate improved functional ability. Baseline: 36/80 Goal status: INITIAL      PLAN:  PT FREQUENCY: 2x/week  PT DURATION: 8 weeks  PLANNED INTERVENTIONS: 97164- PT Re-evaluation, 97750- Physical Performance Testing, 97110-Therapeutic exercises, 97530- Therapeutic activity, [id number]- Neuromuscular re-education, 97535- Self Care, 02859- Manual therapy, [phone]- Gait training, [phone]- Electrical stimulation (unattended), 97016- Vasopneumatic device, [id number]- Ionotophoresis 4mg /ml Dexamethasone , 79439 (1-2 muscles), 20561 (3+ muscles)- Dry Needling, Patient/Family education, Balance training, Taping, Joint mobilization, Spinal mobilization, Cryotherapy, and Moist heat  PLAN FOR NEXT SESSION: Update/modify pt's HEP. Work on knee ROM and strength. Gait training with cane indoor/outdoor/stairs   Teddy Rebstock April Ma L Woodland, PT, DPT 10/06/2023, 10:23 AM

## 2023-10-11 ENCOUNTER — Ambulatory Visit: Admitting: Physical Therapy

## 2023-10-11 ENCOUNTER — Encounter: Payer: Self-pay | Admitting: Physical Therapy

## 2023-10-11 DIAGNOSIS — R2689 Other abnormalities of gait and mobility: Secondary | ICD-10-CM

## 2023-10-11 DIAGNOSIS — M25661 Stiffness of right knee, not elsewhere classified: Secondary | ICD-10-CM | POA: Diagnosis not present

## 2023-10-11 DIAGNOSIS — M25561 Pain in right knee: Secondary | ICD-10-CM

## 2023-10-11 DIAGNOSIS — M6281 Muscle weakness (generalized): Secondary | ICD-10-CM

## 2023-10-11 NOTE — Therapy (Signed)
 OUTPATIENT PHYSICAL THERAPY TREATMENT   Patient Name: Fernando Pratt MRN: [id number] DOB:16-Feb-1945, 78 y.o., male Today's Date: 10/11/2023  END OF SESSION:  PT End of Session - 10/11/23 1522     Visit Number 2    Number of Visits 16    Date for Recertification  12/01/23    Authorization Type [employer]    PT Start Time 1518    PT Stop Time 1558    PT Time Calculation (min) 40 min    Activity Tolerance Patient tolerated treatment well           Past Medical History:  Diagnosis Date   Arthritis    Complication of anesthesia    slow to wake up after neck surgery   Hyperlipidemia    Past Surgical History:  Procedure Laterality Date   COLONOSCOPY     NECK SURGERY  01/03/1989   TOTAL KNEE ARTHROPLASTY Right 09/22/2023   Procedure: ARTHROPLASTY, KNEE, TOTAL;  Surgeon: Vernetta Lonni GRADE, MD;  Location: [facility];  Service: Orthopedics;  Laterality: Right;   Patient Active Problem List   Diagnosis Date Noted   Status post total right knee replacement 09/22/2023   Unilateral primary osteoarthritis, right knee 09/21/2023   Spondylosis, cervical, with myelopathy 09/22/2015   Pain in joint of left shoulder 09/22/2015   Hyperlipidemia 09/22/2015   Neuropathy 09/22/2015   Vitamin D  deficiency 09/22/2015    PCP: Zollie Lowers, MD  REFERRING PROVIDER: Vernetta Lonni GRADE, MD  REFERRING DIAG: M17.11 (ICD-10-CM) - Unilateral primary osteoarthritis, right knee Z96.651 (ICD-10-CM) - Status post total right knee replacement  THERAPY DIAG:  Stiffness of right knee, not elsewhere classified  Acute pain of right knee  Muscle weakness (generalized)  Other abnormalities of gait and mobility  Rationale for Evaluation and Treatment: Rehabilitation  ONSET DATE: 09/22/23 surgery date  SUBJECTIVE:   SUBJECTIVE STATEMENT: Pt states he was able to mow his lawn with zero turn lawnmower. Has been using walker some (25% of the time) like going out to [employer]. Has  noticed some loss of weight. Pain has been up/down.   PERTINENT HISTORY: History of neck surgery, R achilles injury  PAIN:  Are you having pain? Yes: NPRS scale: 1-2 constant Pain location: Along R knee joint line Pain description: throbbing Aggravating factors: Exercise Relieving factors: Ice   PRECAUTIONS: None  RED FLAGS: None   WEIGHT BEARING RESTRICTIONS: No  FALLS:  Has patient fallen in last 6 months? 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  DIAGNOSTIC FINDINGS: n/a   PATIENT SURVEYS:  LEFS  Extreme difficulty/unable (0), Quite a bit of difficulty (1), Moderate difficulty (2), Little difficulty (3), No difficulty (4) Survey date:  10/06/23  Any of your usual work, housework or school activities 3  2. Usual hobbies, recreational or sporting activities 2  3. Getting into/out of the bath 4  4. Walking between rooms 4  5. Putting on socks/shoes 2  6. Squatting  1  7. Lifting an object, like a bag of groceries from the floor 2  8. Performing light activities around your  home 2  9. Performing heavy activities around your home 2  10. Getting into/out of a car 2  11. Walking 2 blocks 2  12. Walking 1 mile 1  13. Going up/down 10 stairs (1 flight) 3  14. Standing for 1 hour 2  15.  sitting for 1 hour 2  16. Running on even ground 0  17. Running on uneven ground 0  18. Making sharp turns while running fast 0  19. Hopping  0  20. Rolling over in bed 2  Score total:  36/80     EDEMA:  Mild edema  MUSCLE  LENGTH: Did not assess -- see knee ROM  POSTURE: flexed trunk   PALPATION: No unexpected tenderness to palpation  LOWER EXTREMITY ROM:  Active/Passive ROM Right eval Left eval  Hip flexion    Hip extension    Hip abduction    Hip adduction    Hip internal rotation    Hip external rotation    Knee flexion 95/102 127/127  Knee extension -20/-9 0/0  Ankle dorsiflexion    Ankle plantarflexion    Ankle inversion    Ankle eversion     (Blank rows = not tested)  LOWER EXTREMITY MMT:  MMT Right eval Left eval  Hip flexion 4+ 5  Hip extension 4- 4  Hip abduction 3+ 4  Hip adduction    Hip internal rotation    Hip external rotation    Knee flexion 4+ 5  Knee extension 4+ 5  Ankle dorsiflexion    Ankle plantarflexion    Ankle inversion    Ankle eversion     (Blank rows = not tested)  LOWER EXTREMITY SPECIAL TESTS:  Did not assess  FUNCTIONAL TESTS:  5 times sit to stand: 13.78 sec with increased weight shift to L LE but no UE support SLS: L 43.15 sec, R 4.16 sec  GAIT: Distance walked: ~100' Assistive device utilized: tri tip cane Level of assistance: SBA Comments: R LE slightly externally rotated, R knee slightly flexed throughout gait, cane on L, diminished heel strike and hip ext during stance                                                                                                                                TREATMENT DATE:  10/11/23 Nustep L3 x 12 min total, starting at seat 12 to seat 8 Seated knee flexion self ROM 10x5 Seated hamstring curl red TB 2x10 Seated hamstring stretch 2x30 Seated SLR 2x10 Standing hip abd red TB 2x10 on R; no resistance on L Standing hip ext red TB 2x10 on R; no resistance on L Gait training: Amb with narrower BOS, equal step length and focus on R LE heel strike/knee ext with and without [facility]  10/06/23 Education only   PATIENT EDUCATION:  Education details: POC, bringing in +*)HHPT HEP so we can update it  accordingly, exam findings Person educated: Patient and Spouse Education  method: Explanation, Demonstration, and Handouts Education comprehension: verbalized understanding, returned demonstration, and needs further education  HOME EXERCISE PROGRAM: Access Code: [email or web address] URL: [email or web address] Date: 10/11/2023 Prepared by: Mayrin Schmuck April Earnie Starring  Exercises - Seated Knee Flexion AAROM  - 1 x daily - 7 x weekly - 1 sets - 10 reps - 5 hold - Seated Hamstring Curls with Resistance  - 1 x daily - 7 x weekly - 2 sets - 10 reps - 3 sec hold - Seated Hamstring Stretch  - 1 x daily - 7 x weekly - 2 sets - 30 sec hold - Seated Active Straight-Leg Raise  - 1 x daily - 7 x weekly - 2 sets - 10 reps - Hip Abduction with Resistance Loop  - 1 x daily - 7 x weekly - 2 sets - 10 reps - 3 sec hold - Hip Extension with Resistance Loop  - 1 x daily - 7 x weekly - 2 sets - 10 reps - 3 sec hold  ASSESSMENT:  CLINICAL IMPRESSION: Treatment focused on updating pt's HEP. Added more resistance training today and standing exercises to improve weight shift and stability on to R LE. Difficulty maintaining knee extension in standing  OBJECTIVE IMPAIRMENTS: Abnormal gait, decreased activity tolerance, decreased balance, decreased endurance, decreased mobility, difficulty walking, decreased ROM, decreased strength, hypomobility, increased edema, increased fascial restrictions, increased muscle spasms, improper body mechanics, postural dysfunction, and pain.   ACTIVITY LIMITATIONS: carrying, lifting, bending, sitting, standing, squatting, sleeping, stairs, transfers, bed mobility, bathing, toileting, hygiene/grooming, and locomotion level  PARTICIPATION LIMITATIONS: meal prep, cleaning, driving, shopping, community activity, occupation, and yard work  PERSONAL FACTORS: Age, Fitness, Past/current experiences, and Time since onset of injury/illness/exacerbation are also affecting patient's functional  outcome.   REHAB POTENTIAL: Good  CLINICAL DECISION MAKING: Evolving/moderate complexity  EVALUATION COMPLEXITY: Moderate   GOALS: Goals reviewed with patient? Yes  SHORT TERM GOALS: Target date: 11/03/2023  Patient will be independent with initial HEP. Baseline: From HHPT Goal status: INITIAL   LONG TERM GOALS: Target date: 12/01/2023   Patient will be independent with advanced/ongoing HEP to improve outcomes and carryover.  Baseline: Not yet provided Goal status: INITIAL  2.  Patient will report at least 75% improvement in R knee pain to improve QOL. Baseline: 1-2 Goal status: INITIAL  3.  Patient will demonstrate improved R knee AROM to >/= 5-120 deg to allow for normal gait and stair mechanics. Baseline: 20-95 AROM Goal status: INITIAL  4.  Patient will demonstrate improved functional LE strength as demonstrated by 5 x STS of </= 10 sec. Baseline: 13 Goal status: INITIAL  5.  Patient will be able to ambulate 600' with LRAD and normal gait pattern without increased pain to access community.  Baseline: 100' with SPC Goal status: INITIAL  6. Patient will be able to ascend/descend stairs without HR using reciprocal step pattern safely to access home and community.  Baseline: Able to ascend/descend 4 steps with 1 HR and cane reciprocally Goal status: INITIAL  7.  Patient will report 48 on LEFS (patient reported outcome measure) to demonstrate improved functional ability. Baseline: 36/80 Goal status: INITIAL      PLAN:  PT FREQUENCY: 2x/week  PT DURATION: 8 weeks  PLANNED INTERVENTIONS: 97164- PT Re-evaluation, 97750- Physical Performance Testing, 97110-Therapeutic exercises, 97530- Therapeutic activity, [id number]- Neuromuscular re-education, 97535- Self Care, 02859- Manual therapy, [id number]- Gait training, [phone]- Electrical stimulation (unattended), 97016- Vasopneumatic device, [id number]- Ionotophoresis 4mg /ml Dexamethasone , 79439 (1-2 muscles), 20561 (3+ muscles)- Dry  Needling, Patient/Family  education, Balance training, Taping, Joint mobilization, Spinal mobilization, Cryotherapy, and Moist heat  PLAN FOR NEXT SESSION: Review HEP for form. Work on knee ROM and strength. Standing terminal knee ext. Gait training with cane indoor/outdoor/stairs   Hassaan Crite April Ma L Kimball, PT, DPT 10/11/2023, 3:23 PM

## 2023-10-13 ENCOUNTER — Ambulatory Visit: Admitting: Physical Therapy

## 2023-10-13 ENCOUNTER — Encounter: Payer: Self-pay | Admitting: Physical Therapy

## 2023-10-13 DIAGNOSIS — M25661 Stiffness of right knee, not elsewhere classified: Secondary | ICD-10-CM

## 2023-10-13 DIAGNOSIS — M25561 Pain in right knee: Secondary | ICD-10-CM

## 2023-10-13 DIAGNOSIS — M6281 Muscle weakness (generalized): Secondary | ICD-10-CM

## 2023-10-13 NOTE — Therapy (Signed)
 OUTPATIENT PHYSICAL THERAPY TREATMENT   Patient Name: Fernando Pratt MRN: [id number] DOB:1945/07/06, 78 y.o., male Today's Date: 10/13/2023  END OF SESSION:  PT End of Session - 10/13/23 1100     Visit Number 3    Number of Visits 16    Date for Recertification  12/01/23    PT Start Time 0930    PT Stop Time 1019    PT Time Calculation (min) 49 min    Activity Tolerance Patient tolerated treatment well    Behavior During Therapy [facility] for tasks assessed/performed            Past Medical History:  Diagnosis Date   Arthritis    Complication of anesthesia    slow to wake up after neck surgery   Hyperlipidemia    Past Surgical History:  Procedure Laterality Date   COLONOSCOPY     NECK SURGERY  01/03/1989   TOTAL KNEE ARTHROPLASTY Right 09/22/2023   Procedure: ARTHROPLASTY, KNEE, TOTAL;  Surgeon: Vernetta Lonni GRADE, MD;  Location: [facility];  Service: Orthopedics;  Laterality: Right;   Patient Active Problem List   Diagnosis Date Noted   Status post total right knee replacement 09/22/2023   Unilateral primary osteoarthritis, right knee 09/21/2023   Spondylosis, cervical, with myelopathy 09/22/2015   Pain in joint of left shoulder 09/22/2015   Hyperlipidemia 09/22/2015   Neuropathy 09/22/2015   Vitamin D  deficiency 09/22/2015    PCP: Zollie Lowers, MD  REFERRING PROVIDER: Vernetta Lonni GRADE, MD  REFERRING DIAG: M17.11 (ICD-10-CM) - Unilateral primary osteoarthritis, right knee Z96.651 (ICD-10-CM) - Status post total right knee replacement  THERAPY DIAG:  Stiffness of right knee, not elsewhere classified  Acute pain of right knee  Muscle weakness (generalized)  Rationale for Evaluation and Treatment: Rehabilitation  ONSET DATE: 09/22/23 surgery date  SUBJECTIVE:   SUBJECTIVE STATEMENT: No new complaints.    PERTINENT HISTORY: History of neck surgery, R achilles injury  PAIN:  Are you having pain? Yes: NPRS scale: 1-2 constant Pain location:  Along R knee joint line Pain description: throbbing Aggravating factors: Exercise Relieving factors: Ice   PRECAUTIONS: None  RED FLAGS: None   WEIGHT BEARING RESTRICTIONS: No  FALLS:  Has patient fallen in last 6 months? 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  DIAGNOSTIC FINDINGS: n/a   PATIENT SURVEYS:  LEFS  Extreme difficulty/unable (0), Quite a bit of difficulty (1), Moderate difficulty (2), Little difficulty (3), No difficulty (4) Survey date:  10/06/23  Any of your usual work, housework or school activities 3  2. Usual hobbies, recreational or sporting activities 2  3. Getting into/out of the bath 4  4. Walking between rooms 4  5. Putting on socks/shoes 2  6. Squatting  1  7. Lifting an object, like a bag of groceries from the floor 2  8. Performing light activities around your home 2  9. Performing heavy activities around your home 2  10. Getting into/out of a car 2  11. Walking 2 blocks 2  12. Walking 1 mile 1  13. Going up/down 10  stairs (1 flight) 3  14. Standing for 1 hour 2  15.  sitting for 1 hour 2  16. Running on even ground 0  17. Running on uneven ground 0  18. Making sharp turns while running fast 0  19. Hopping  0  20. Rolling over in bed 2  Score total:  36/80     EDEMA:  Mild edema  MUSCLE LENGTH: Did not assess -- see knee ROM  POSTURE: flexed trunk   PALPATION: No unexpected tenderness to palpation  LOWER EXTREMITY ROM:  Active/Passive ROM Right eval Left eval  Hip flexion     Hip extension    Hip abduction    Hip adduction    Hip internal rotation    Hip external rotation    Knee flexion 95/102 127/127  Knee extension -20/-9 0/0  Ankle dorsiflexion    Ankle plantarflexion    Ankle inversion    Ankle eversion     (Blank rows = not tested)  LOWER EXTREMITY MMT:  MMT Right eval Left eval  Hip flexion 4+ 5  Hip extension 4- 4  Hip abduction 3+ 4  Hip adduction    Hip internal rotation    Hip external rotation    Knee flexion 4+ 5  Knee extension 4+ 5  Ankle dorsiflexion    Ankle plantarflexion    Ankle inversion    Ankle eversion     (Blank rows = not tested)  LOWER EXTREMITY SPECIAL TESTS:  Did not assess  FUNCTIONAL TESTS:  5 times sit to stand: 13.78 sec with increased weight shift to L LE but no UE support SLS: L 43.15 sec, R 4.16 sec  GAIT: Distance walked: ~100' Assistive device utilized: tri tip cane Level of assistance: SBA Comments: R LE slightly externally rotated, R knee slightly flexed throughout gait, cane on L, diminished heel strike and hip ext during stance                                                                                                                                TREATMENT DATE:   10/13/23:                                       EXERCISE LOG  Exercise Repetitions and Resistance Comments  Nustep Level 3 x 15 minutes moving seat forward x 2 to increase flexion.     Rockerboard In parallel bars x 4 minutes.               PROM in supine x 8 minutes with LLLDS technique into left knee flexion and extension f/b LE elevation x 15 minutes.    10/11/23 Nustep L3 x 12 min total, starting at seat 12 to seat 8 Seated knee flexion self ROM 10x5 Seated hamstring curl red TB 2x10 Seated hamstring stretch 2x30 Seated SLR 2x10  Standing hip abd red TB 2x10 on R; no resistance on L Standing hip ext red TB 2x10 on R; no resistance on L Gait training: Amb with narrower BOS, equal step length and focus on  R LE heel strike/knee ext with and without [facility]  10/06/23 Education only   PATIENT EDUCATION:  Education details: See below. Person educated: Patient and Spouse Education method: Explanation, Demonstration, and Handouts Education comprehension: verbalized understanding, returned demonstration, and needs further education  HOME EXERCISE PROGRAM: HOME EXERCISE PROGRAM [[id number]]  PRONE KNEE HANGS -  Repeat 1 Repetition, Hold 10 Minutes, Complete 1 Set, Perform 3 Times a Day  Exercises - Seated Knee Flexion AAROM  - 1 x daily - 7 x weekly - 1 sets - 10 reps - 5 hold - Seated Hamstring Curls with Resistance  - 1 x daily - 7 x weekly - 2 sets - 10 reps - 3 sec hold - Seated Hamstring Stretch  - 1 x daily - 7 x weekly - 2 sets - 30 sec hold - Seated Active Straight-Leg Raise  - 1 x daily - 7 x weekly - 2 sets - 10 reps - Hip Abduction with Resistance Loop  - 1 x daily - 7 x weekly - 2 sets - 10 reps - 3 sec hold - Hip Extension with Resistance Loop  - 1 x daily - 7 x weekly - 2 sets - 10 reps - 3 sec hold  ASSESSMENT:  CLINICAL IMPRESSION: The patient did very well with treatment.  We discussed working on extension using his Zero Knee and he was instructed in the prone exercise.  Picture provided.    OBJECTIVE IMPAIRMENTS: Abnormal gait, decreased activity tolerance, decreased balance, decreased endurance, decreased mobility, difficulty walking, decreased ROM, decreased strength, hypomobility, increased edema, increased fascial restrictions, increased muscle spasms, improper body mechanics, postural dysfunction, and pain.   ACTIVITY LIMITATIONS: carrying, lifting, bending, sitting, standing, squatting, sleeping, stairs, transfers, bed mobility, bathing, toileting, hygiene/grooming, and locomotion level  PARTICIPATION LIMITATIONS: meal prep, cleaning, driving, shopping, community activity, occupation, and yard work  PERSONAL FACTORS: Age, Fitness, Past/current experiences, and Time since  onset of injury/illness/exacerbation are also affecting patient's functional outcome.   REHAB POTENTIAL: Good  CLINICAL DECISION MAKING: Evolving/moderate complexity  EVALUATION COMPLEXITY: Moderate   GOALS: Goals reviewed with patient? Yes  SHORT TERM GOALS: Target date: 11/03/2023  Patient will be independent with initial HEP. Baseline: From HHPT Goal status: INITIAL   LONG TERM GOALS: Target date: 12/01/2023   Patient will be independent with advanced/ongoing HEP to improve outcomes and carryover.  Baseline: Not yet provided Goal status: INITIAL  2.  Patient will report at least 75% improvement in R knee pain to improve QOL. Baseline: 1-2 Goal status: INITIAL  3.  Patient will demonstrate improved R knee AROM to >/= 5-120 deg to allow for normal gait and stair mechanics. Baseline: 20-95 AROM Goal status: INITIAL  4.  Patient will demonstrate improved functional LE strength as demonstrated by 5 x STS of </= 10 sec. Baseline: 13 Goal status: INITIAL  5.  Patient will be able to ambulate 600' with LRAD and normal gait pattern without increased pain to access community.  Baseline: 100' with SPC Goal status: INITIAL  6. Patient will be able to ascend/descend stairs without HR using reciprocal step pattern safely to access home and community.  Baseline: Able to ascend/descend 4 steps with 1 HR and cane reciprocally Goal status: INITIAL  7.  Patient will report 48 on LEFS (patient  reported outcome measure) to demonstrate improved functional ability. Baseline: 36/80 Goal status: INITIAL      PLAN:  PT FREQUENCY: 2x/week  PT DURATION: 8 weeks  PLANNED INTERVENTIONS: 97164- PT Re-evaluation, 97750- Physical Performance Testing, 97110-Therapeutic exercises, 97530- Therapeutic activity, [id number]- Neuromuscular re-education, 97535- Self Care, 02859- Manual therapy, [phone]- Gait training, [phone]- Electrical stimulation (unattended), 97016- Vasopneumatic device, [id number]-  Ionotophoresis 4mg /ml Dexamethasone , 79439 (1-2 muscles), 20561 (3+ muscles)- Dry Needling, Patient/Family education, Balance training, Taping, Joint mobilization, Spinal mobilization, Cryotherapy, and Moist heat  PLAN FOR NEXT SESSION: Review HEP for form. Work on knee ROM and strength. Standing terminal knee ext. Gait training with cane indoor/outdoor/stairs   Kensy Blizard, [place], PT, DPT 10/13/2023, 12:42 PM

## 2023-10-18 ENCOUNTER — Ambulatory Visit: Admitting: Physical Therapy

## 2023-10-18 DIAGNOSIS — M25661 Stiffness of right knee, not elsewhere classified: Secondary | ICD-10-CM | POA: Diagnosis not present

## 2023-10-18 DIAGNOSIS — R2689 Other abnormalities of gait and mobility: Secondary | ICD-10-CM

## 2023-10-18 DIAGNOSIS — M25561 Pain in right knee: Secondary | ICD-10-CM

## 2023-10-18 DIAGNOSIS — M6281 Muscle weakness (generalized): Secondary | ICD-10-CM

## 2023-10-18 NOTE — Therapy (Signed)
 OUTPATIENT PHYSICAL THERAPY TREATMENT   Patient Name: Fernando Pratt MRN: [id number] DOB:Apr 03, 1945, 78 y.o., male Today's Date: 10/18/2023  END OF SESSION:  PT End of Session - 10/18/23 1116     Visit Number 4    Number of Visits 16    Date for Recertification  12/01/23    Authorization Type [employer]    PT Start Time 1055    PT Stop Time 1150    PT Time Calculation (min) 55 min    Activity Tolerance Patient tolerated treatment well    Behavior During Therapy WFL for tasks assessed/performed            Past Medical History:  Diagnosis Date   Arthritis    Complication of anesthesia    slow to wake up after neck surgery   Hyperlipidemia    Past Surgical History:  Procedure Laterality Date   COLONOSCOPY     NECK SURGERY  01/03/1989   TOTAL KNEE ARTHROPLASTY Right 09/22/2023   Procedure: ARTHROPLASTY, KNEE, TOTAL;  Surgeon: Vernetta Lonni GRADE, MD;  Location: [facility];  Service: Orthopedics;  Laterality: Right;   Patient Active Problem List   Diagnosis Date Noted   Status post total right knee replacement 09/22/2023   Unilateral primary osteoarthritis, right knee 09/21/2023   Spondylosis, cervical, with myelopathy 09/22/2015   Pain in joint of left shoulder 09/22/2015   Hyperlipidemia 09/22/2015   Neuropathy 09/22/2015   Vitamin D  deficiency 09/22/2015    PCP: Zollie Lowers, MD  REFERRING PROVIDER: Vernetta Lonni GRADE, MD  REFERRING DIAG: M17.11 (ICD-10-CM) - Unilateral primary osteoarthritis, right knee Z96.651 (ICD-10-CM) - Status post total right knee replacement  THERAPY DIAG:  Stiffness of right knee, not elsewhere classified  Acute pain of right knee  Muscle weakness (generalized)  Other abnormalities of gait and mobility  Rationale for Evaluation and Treatment: Rehabilitation  ONSET DATE: 09/22/23 surgery date  SUBJECTIVE:   SUBJECTIVE STATEMENT: Right thigh is really sore PERTINENT HISTORY: History of neck surgery, R  achilles injury  PAIN:  Are you having pain? Yes: NPRS scale: 1-2 constant Pain location: Along R knee joint line Pain description: throbbing Aggravating factors: Exercise Relieving factors: Ice   PRECAUTIONS: None  RED FLAGS: None   WEIGHT BEARING RESTRICTIONS: No  FALLS:  Has patient fallen in last 6 months? 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  DIAGNOSTIC FINDINGS: n/a   PATIENT SURVEYS:  LEFS  Extreme difficulty/unable (0), Quite a bit of difficulty (1), Moderate difficulty (2), Little difficulty (3), No difficulty (4) Survey date:  10/06/23  Any of your usual work, housework or school activities 3  2. Usual hobbies, recreational or sporting activities 2  3. Getting into/out of the bath 4  4. Walking between rooms 4  5. Putting on socks/shoes 2  6. Squatting  1  7. Lifting an object, like a bag of groceries from the floor 2  8. Performing light activities around your home 2  9. Performing heavy activities around your home 2  10. Getting into/out of a car 2  11. Walking 2  blocks 2  12. Walking 1 mile 1  13. Going up/down 10 stairs (1 flight) 3  14. Standing for 1 hour 2  15.  sitting for 1 hour 2  16. Running on even ground 0  17. Running on uneven ground 0  18. Making sharp turns while running fast 0  19. Hopping  0  20. Rolling over in bed 2  Score total:  36/80     EDEMA:  Mild edema  MUSCLE LENGTH: Did not assess -- see knee ROM  POSTURE: flexed trunk   PALPATION: No unexpected tenderness  to palpation  LOWER EXTREMITY ROM:  Active/Passive ROM Right eval Left eval  Hip flexion    Hip extension    Hip abduction    Hip adduction    Hip internal rotation    Hip external rotation    Knee flexion 95/102 127/127  Knee extension -20/-9 0/0  Ankle dorsiflexion    Ankle plantarflexion    Ankle inversion    Ankle eversion     (Blank rows = not tested)  LOWER EXTREMITY MMT:  MMT Right eval Left eval  Hip flexion 4+ 5  Hip extension 4- 4  Hip abduction 3+ 4  Hip adduction    Hip internal rotation    Hip external rotation    Knee flexion 4+ 5  Knee extension 4+ 5  Ankle dorsiflexion    Ankle plantarflexion    Ankle inversion    Ankle eversion     (Blank rows = not tested)  LOWER EXTREMITY SPECIAL TESTS:  Did not assess  FUNCTIONAL TESTS:  5 times sit to stand: 13.78 sec with increased weight shift to L LE but no UE support SLS: L 43.15 sec, R 4.16 sec  GAIT: Distance walked: ~100' Assistive device utilized: tri tip cane Level of assistance: SBA Comments: R LE slightly externally rotated, R knee slightly flexed throughout gait, cane on L, diminished heel strike and hip ext during stance                                                                                                                                TREATMENT DATE:   10/18/23:                                     EXERCISE LOG  Exercise Repetitions and Resistance Comments  Nustep Level 3 moving seat forward x 2 to increase flexion x 15 minutes    Recumbent bike Level 2 beginning at seat 10 and finishing at seat 9 x 10 minutes               STW/M x 13 minutes to left quadriceps f/b LE elevation and vasopneumatic x 15 minutes on medium.    10/13/23:  EXERCISE LOG  Exercise Repetitions and Resistance Comments  Nustep Level 3 x 15 minutes moving seat forward x 2 to increase flexion.     Rockerboard In parallel bars x 4 minutes.                PROM in supine x 8 minutes with LLLDS technique into left knee flexion and extension f/b LE elevation x 15 minutes.    10/11/23 Nustep L3 x 12 min total, starting at seat 12 to seat 8 Seated knee flexion self ROM 10x5 Seated hamstring curl red TB 2x10 Seated hamstring stretch 2x30 Seated SLR 2x10 Standing hip abd red TB 2x10 on R; no resistance on L Standing hip ext red TB 2x10 on R; no resistance on L Gait training: Amb with narrower BOS, equal step length and focus on R LE heel strike/knee ext with and without [facility]  10/06/23 Education only   PATIENT EDUCATION:  Education details: See below. Person educated: Patient and Spouse Education method: Explanation, Demonstration, and Handouts Education comprehension: verbalized understanding, returned demonstration, and needs further education  HOME EXERCISE PROGRAM: HOME EXERCISE PROGRAM [[id number]]  PRONE KNEE HANGS -  Repeat 1 Repetition, Hold 10 Minutes, Complete 1 Set, Perform 3 Times a Day  Exercises - Seated Knee Flexion AAROM  - 1 x daily - 7 x weekly - 1 sets - 10 reps - 5 hold - Seated Hamstring Curls with Resistance  - 1 x daily - 7 x weekly - 2 sets - 10 reps - 3 sec hold - Seated Hamstring Stretch  - 1 x daily - 7 x weekly - 2 sets - 30 sec hold - Seated Active Straight-Leg Raise  - 1 x daily - 7 x weekly - 2 sets - 10 reps - Hip Abduction with Resistance Loop  - 1 x daily - 7 x weekly - 2 sets - 10 reps - 3 sec hold - Hip Extension with Resistance Loop  - 1 x daily - 7 x weekly - 2 sets - 10 reps - 3 sec hold  ASSESSMENT:  CLINICAL IMPRESSION: The patient did very well today and able to progress to recumbent bike and make forward revolutions.  He had notable trigger points over the left quads with a very good response to STW/M.    OBJECTIVE IMPAIRMENTS: Abnormal gait, decreased activity tolerance, decreased balance, decreased endurance, decreased mobility, difficulty walking, decreased ROM, decreased strength,  hypomobility, increased edema, increased fascial restrictions, increased muscle spasms, improper body mechanics, postural dysfunction, and pain.   ACTIVITY LIMITATIONS: carrying, lifting, bending, sitting, standing, squatting, sleeping, stairs, transfers, bed mobility, bathing, toileting, hygiene/grooming, and locomotion level  PARTICIPATION LIMITATIONS: meal prep, cleaning, driving, shopping, community activity, occupation, and yard work  PERSONAL FACTORS: Age, Fitness, Past/current experiences, and Time since onset of injury/illness/exacerbation are also affecting patient's functional outcome.   REHAB POTENTIAL: Good  CLINICAL DECISION MAKING: Evolving/moderate complexity  EVALUATION COMPLEXITY: Moderate   GOALS: Goals reviewed with patient? Yes  SHORT TERM GOALS: Target date: 11/03/2023  Patient will be independent with initial HEP. Baseline: From HHPT Goal status: INITIAL   LONG TERM GOALS: Target date: 12/01/2023   Patient will be independent with advanced/ongoing HEP to improve outcomes and carryover.  Baseline: Not yet provided Goal status: INITIAL  2.  Patient will report at least 75% improvement in R knee pain to improve QOL. Baseline: 1-2 Goal status: INITIAL  3.  Patient will demonstrate improved R knee AROM to >/= 5-120 deg to allow for normal gait  and stair mechanics. Baseline: 20-95 AROM Goal status: INITIAL  4.  Patient will demonstrate improved functional LE strength as demonstrated by 5 x STS of </= 10 sec. Baseline: 13 Goal status: INITIAL  5.  Patient will be able to ambulate 600' with LRAD and normal gait pattern without increased pain to access community.  Baseline: 100' with SPC Goal status: INITIAL  6. Patient will be able to ascend/descend stairs without HR using reciprocal step pattern safely to access home and community.  Baseline: Able to ascend/descend 4 steps with 1 HR and cane reciprocally Goal status: INITIAL  7.  Patient will report  48 on LEFS (patient reported outcome measure) to demonstrate improved functional ability. Baseline: 36/80 Goal status: INITIAL      PLAN:  PT FREQUENCY: 2x/week  PT DURATION: 8 weeks  PLANNED INTERVENTIONS: 97164- PT Re-evaluation, 97750- Physical Performance Testing, 97110-Therapeutic exercises, 97530- Therapeutic activity, [id number]- Neuromuscular re-education, 97535- Self Care, 02859- Manual therapy, [phone]- Gait training, [phone]- Electrical stimulation (unattended), 97016- Vasopneumatic device, [id number]- Ionotophoresis 4mg /ml Dexamethasone , 79439 (1-2 muscles), 20561 (3+ muscles)- Dry Needling, Patient/Family education, Balance training, Taping, Joint mobilization, Spinal mobilization, Cryotherapy, and Moist heat  PLAN FOR NEXT SESSION: Review HEP for form. Work on knee ROM and strength. Standing terminal knee ext. Gait training with cane indoor/outdoor/stairs   Asucena Galer, [place], PT, DPT 10/18/2023, 12:23 PM

## 2023-10-20 ENCOUNTER — Ambulatory Visit: Admitting: *Deleted

## 2023-10-20 ENCOUNTER — Encounter: Payer: Self-pay | Admitting: *Deleted

## 2023-10-20 DIAGNOSIS — M6281 Muscle weakness (generalized): Secondary | ICD-10-CM

## 2023-10-20 DIAGNOSIS — R2689 Other abnormalities of gait and mobility: Secondary | ICD-10-CM

## 2023-10-20 DIAGNOSIS — M25661 Stiffness of right knee, not elsewhere classified: Secondary | ICD-10-CM | POA: Diagnosis not present

## 2023-10-20 DIAGNOSIS — M25561 Pain in right knee: Secondary | ICD-10-CM

## 2023-10-20 NOTE — Therapy (Signed)
 OUTPATIENT PHYSICAL THERAPY TREATMENT   Patient Name: BODEN STUCKY MRN: [id number] DOB:10/27/1945, 78 y.o., male Today's Date: 10/20/2023  END OF SESSION:  PT End of Session - 10/20/23 0939     Visit Number 5    Number of Visits 16    Date for Recertification  12/01/23    Authorization Type [employer]    PT Start Time 0930    PT Stop Time 1034    PT Time Calculation (min) 64 min            Past Medical History:  Diagnosis Date   Arthritis    Complication of anesthesia    slow to wake up after neck surgery   Hyperlipidemia    Past Surgical History:  Procedure Laterality Date   COLONOSCOPY     NECK SURGERY  01/03/1989   TOTAL KNEE ARTHROPLASTY Right 09/22/2023   Procedure: ARTHROPLASTY, KNEE, TOTAL;  Surgeon: Vernetta Lonni GRADE, MD;  Location: [facility];  Service: Orthopedics;  Laterality: Right;   Patient Active Problem List   Diagnosis Date Noted   Status post total right knee replacement 09/22/2023   Unilateral primary osteoarthritis, right knee 09/21/2023   Spondylosis, cervical, with myelopathy 09/22/2015   Pain in joint of left shoulder 09/22/2015   Hyperlipidemia 09/22/2015   Neuropathy 09/22/2015   Vitamin D  deficiency 09/22/2015    PCP: Zollie Lowers, MD  REFERRING PROVIDER: Vernetta Lonni GRADE, MD  REFERRING DIAG: M17.11 (ICD-10-CM) - Unilateral primary osteoarthritis, right knee Z96.651 (ICD-10-CM) - Status post total right knee replacement  THERAPY DIAG:  Stiffness of right knee, not elsewhere classified  Acute pain of right knee  Muscle weakness (generalized)  Other abnormalities of gait and mobility  Rationale for Evaluation and Treatment: Rehabilitation  ONSET DATE: 09/22/23 surgery date  SUBJECTIVE:   SUBJECTIVE STATEMENT: Right thigh is doing better. 2/10 today PERTINENT HISTORY: History of neck surgery, R achilles injury  PAIN:  Are you having pain? Yes: NPRS scale: 1-2 constant Pain location: Along R  knee joint line Pain description: throbbing Aggravating factors: Exercise Relieving factors: Ice   PRECAUTIONS: None  RED FLAGS: None   WEIGHT BEARING RESTRICTIONS: No  FALLS:  Has patient fallen in last 6 months? 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  DIAGNOSTIC FINDINGS: n/a   PATIENT SURVEYS:  LEFS  Extreme difficulty/unable (0), Quite a bit of difficulty (1), Moderate difficulty (2), Little difficulty (3), No difficulty (4) Survey date:  10/06/23  Any of your usual work, housework or school activities 3  2. Usual hobbies, recreational or sporting activities 2  3. Getting into/out of the bath 4  4. Walking between rooms 4  5. Putting on socks/shoes 2  6. Squatting  1  7. Lifting an object, like a bag of groceries from the floor 2  8. Performing light activities around your home 2  9. Performing heavy activities around your home 2  10. Getting into/out of a car 2  11. Walking 2 blocks 2  12. Walking 1 mile 1  13. Going up/down 10 stairs (1 flight) 3  14. Standing for 1 hour 2  15.  sitting for 1 hour 2  16. Running on even ground 0  17. Running on uneven ground 0  18. Making sharp turns while running fast 0  19. Hopping  0  20. Rolling over in bed 2  Score total:  36/80     EDEMA:  Mild edema  MUSCLE LENGTH: Did not assess -- see knee ROM  POSTURE: flexed trunk   PALPATION: No unexpected tenderness to palpation  LOWER EXTREMITY ROM:  Active/Passive ROM Right eval Left eval  Hip flexion    Hip  extension    Hip abduction    Hip adduction    Hip internal rotation    Hip external rotation    Knee flexion 95/102 127/127  Knee extension -20/-9 0/0  Ankle dorsiflexion    Ankle plantarflexion    Ankle inversion    Ankle eversion     (Blank rows = not tested)  LOWER EXTREMITY MMT:  MMT Right eval Left eval  Hip flexion 4+ 5  Hip extension 4- 4  Hip abduction 3+ 4  Hip adduction    Hip internal rotation    Hip external rotation    Knee flexion 4+ 5  Knee extension 4+ 5  Ankle dorsiflexion    Ankle plantarflexion    Ankle inversion    Ankle eversion     (Blank rows = not tested)  LOWER EXTREMITY SPECIAL TESTS:  Did not assess  FUNCTIONAL TESTS:  5 times sit to stand: 13.78 sec with increased weight shift to L LE but no UE support SLS: L 43.15 sec, R 4.16 sec  GAIT: Distance walked: ~100' Assistive device utilized: tri tip cane Level of assistance: SBA Comments: R LE slightly externally rotated, R knee slightly flexed throughout gait, cane on L, diminished heel strike and hip ext during stance                                                                                                                                TREATMENT DATE:   10/20/23:                                     EXERCISE LOG  Exercise Repetitions and Resistance Comments      Recumbent bike Level 2 beginning at seat 9 and finishing at seat 7 x 16 minutes   Rocker board X 5 mins  ROM and balance   LAQs 2# 3x10   HS curl Red 3x10       STW/M  to quad and posterior knee and PROM for Ext ROM progression. Flexion to 118 degrees today  LE elevation and vasopneumatic x 15 minutes on medium.    10/13/23:  EXERCISE LOG  Exercise Repetitions and Resistance Comments  Nustep Level 3 x 15 minutes moving seat forward x 2 to increase flexion.     Rockerboard In parallel bars x 4 minutes.               PROM in supine x 8 minutes with LLLDS technique into  left knee flexion and extension f/b LE elevation x 15 minutes.    10/11/23 Nustep L3 x 12 min total, starting at seat 12 to seat 8 Seated knee flexion self ROM 10x5 Seated hamstring curl red TB 2x10 Seated hamstring stretch 2x30 Seated SLR 2x10 Standing hip abd red TB 2x10 on R; no resistance on L Standing hip ext red TB 2x10 on R; no resistance on L Gait training: Amb with narrower BOS, equal step length and focus on R LE heel strike/knee ext with and without [facility]  10/06/23 Education only   PATIENT EDUCATION:  Education details: See below. Person educated: Patient and Spouse Education method: Explanation, Demonstration, and Handouts Education comprehension: verbalized understanding, returned demonstration, and needs further education  HOME EXERCISE PROGRAM: HOME EXERCISE PROGRAM [[id number]]  PRONE KNEE HANGS -  Repeat 1 Repetition, Hold 10 Minutes, Complete 1 Set, Perform 3 Times a Day  Exercises - Seated Knee Flexion AAROM  - 1 x daily - 7 x weekly - 1 sets - 10 reps - 5 hold - Seated Hamstring Curls with Resistance  - 1 x daily - 7 x weekly - 2 sets - 10 reps - 3 sec hold - Seated Hamstring Stretch  - 1 x daily - 7 x weekly - 2 sets - 30 sec hold - Seated Active Straight-Leg Raise  - 1 x daily - 7 x weekly - 2 sets - 10 reps - Hip Abduction with Resistance Loop  - 1 x daily - 7 x weekly - 2 sets - 10 reps - 3 sec hold - Hip Extension with Resistance Loop  - 1 x daily - 7 x weekly - 2 sets - 10 reps - 3 sec hold  ASSESSMENT:  CLINICAL IMPRESSION: The patient arrived today reporting decreased thigh pain and 2/10 knee pain today. Rx focused on ROM and strengthening progressions and did well. Flexion PROM to 118 degrees today      OBJECTIVE IMPAIRMENTS: Abnormal gait, decreased activity tolerance, decreased balance, decreased endurance, decreased mobility, difficulty walking, decreased ROM, decreased strength, hypomobility, increased edema, increased fascial restrictions,  increased muscle spasms, improper body mechanics, postural dysfunction, and pain.   ACTIVITY LIMITATIONS: carrying, lifting, bending, sitting, standing, squatting, sleeping, stairs, transfers, bed mobility, bathing, toileting, hygiene/grooming, and locomotion level  PARTICIPATION LIMITATIONS: meal prep, cleaning, driving, shopping, community activity, occupation, and yard work  PERSONAL FACTORS: Age, Fitness, Past/current experiences, and Time since onset of injury/illness/exacerbation are also affecting patient's functional outcome.   REHAB POTENTIAL: Good  CLINICAL DECISION MAKING: Evolving/moderate complexity  EVALUATION COMPLEXITY: Moderate   GOALS: Goals reviewed with patient? Yes  SHORT TERM GOALS: Target date: 11/03/2023  Patient will be independent with initial HEP. Baseline: From HHPT Goal status: INITIAL   LONG TERM GOALS: Target date: 12/01/2023   Patient will be independent with advanced/ongoing HEP to improve outcomes and carryover.  Baseline: Not yet provided Goal status: INITIAL  2.  Patient will report at least 75% improvement in R knee pain to improve QOL. Baseline: 1-2 Goal status: INITIAL  3.  Patient will demonstrate improved R knee AROM to >/= 5-120 deg to allow for normal gait and stair mechanics.  Baseline: 20-95 AROM       118 degrees Goal status: INITIAL  4.  Patient will demonstrate improved functional LE strength as demonstrated by 5 x STS of </= 10 sec. Baseline: 13 Goal status: INITIAL  5.  Patient will be able to ambulate 600' with LRAD and normal gait pattern without increased pain to access community.  Baseline: 100' with SPC Goal status: INITIAL  6. Patient will be able to ascend/descend stairs without HR using reciprocal step pattern safely to access home and community.  Baseline: Able to ascend/descend 4 steps with 1 HR and cane reciprocally Goal status: INITIAL  7.  Patient will report 48 on LEFS (patient reported outcome measure)  to demonstrate improved functional ability. Baseline: 36/80 Goal status: INITIAL      PLAN:  PT FREQUENCY: 2x/week  PT DURATION: 8 weeks  PLANNED INTERVENTIONS: 97164- PT Re-evaluation, 97750- Physical Performance Testing, 97110-Therapeutic exercises, 97530- Therapeutic activity, [id number]- Neuromuscular re-education, 97535- Self Care, 02859- Manual therapy, [phone]- Gait training, [phone]- Electrical stimulation (unattended), 97016- Vasopneumatic device, [id number]- Ionotophoresis 4mg /ml Dexamethasone , 79439 (1-2 muscles), 20561 (3+ muscles)- Dry Needling, Patient/Family education, Balance training, Taping, Joint mobilization, Spinal mobilization, Cryotherapy, and Moist heat  PLAN FOR NEXT SESSION: Review HEP for form. Work on knee ROM and strength. Standing terminal knee ext. Gait training with cane indoor/outdoor/stairs   Maybelle Depaoli,[name], PTA, 10/20/2023, 12:36 PM

## 2023-10-25 ENCOUNTER — Encounter: Payer: Self-pay | Admitting: Physical Therapy

## 2023-10-25 ENCOUNTER — Ambulatory Visit: Admitting: Physical Therapy

## 2023-10-25 DIAGNOSIS — M6281 Muscle weakness (generalized): Secondary | ICD-10-CM

## 2023-10-25 DIAGNOSIS — M25561 Pain in right knee: Secondary | ICD-10-CM

## 2023-10-25 DIAGNOSIS — M25661 Stiffness of right knee, not elsewhere classified: Secondary | ICD-10-CM

## 2023-10-25 DIAGNOSIS — R2689 Other abnormalities of gait and mobility: Secondary | ICD-10-CM

## 2023-10-25 NOTE — Therapy (Signed)
 OUTPATIENT PHYSICAL THERAPY TREATMENT   Patient Name: Fernando Pratt MRN: [id number] DOB:12/11/1945, 78 y.o., male Today's Date: 10/25/2023  END OF SESSION:  PT End of Session - 10/25/23 1526     Visit Number 6    Number of Visits 16    Date for Recertification  12/01/23    PT Start Time 0235    PT Stop Time 0337    PT Time Calculation (min) 62 min    Activity Tolerance Patient tolerated treatment well    Behavior During Therapy [facility] for tasks assessed/performed            Past Medical History:  Diagnosis Date   Arthritis    Complication of anesthesia    slow to wake up after neck surgery   Hyperlipidemia    Past Surgical History:  Procedure Laterality Date   COLONOSCOPY     NECK SURGERY  01/03/1989   TOTAL KNEE ARTHROPLASTY Right 09/22/2023   Procedure: ARTHROPLASTY, KNEE, TOTAL;  Surgeon: Vernetta Lonni GRADE, MD;  Location: [facility];  Service: Orthopedics;  Laterality: Right;   Patient Active Problem List   Diagnosis Date Noted   Status post total right knee replacement 09/22/2023   Unilateral primary osteoarthritis, right knee 09/21/2023   Spondylosis, cervical, with myelopathy 09/22/2015   Pain in joint of left shoulder 09/22/2015   Hyperlipidemia 09/22/2015   Neuropathy 09/22/2015   Vitamin D  deficiency 09/22/2015    PCP: Zollie Lowers, MD  REFERRING PROVIDER: Vernetta Lonni GRADE, MD  REFERRING DIAG: M17.11 (ICD-10-CM) - Unilateral primary osteoarthritis, right knee Z96.651 (ICD-10-CM) - Status post total right knee replacement  THERAPY DIAG:  Stiffness of right knee, not elsewhere classified  Acute pain of right knee  Muscle weakness (generalized)  Other abnormalities of gait and mobility  Rationale for Evaluation and Treatment: Rehabilitation  ONSET DATE: 09/22/23 surgery date  SUBJECTIVE:   SUBJECTIVE STATEMENT: Right thigh is doing better. 2/10 today PERTINENT HISTORY: History of neck surgery, R achilles injury  PAIN:  Are  you having pain? Yes: NPRS scale: 1-2 constant Pain location: Along R knee joint line Pain description: throbbing Aggravating factors: Exercise Relieving factors: Ice   PRECAUTIONS: None  RED FLAGS: None   WEIGHT BEARING RESTRICTIONS: No  FALLS:  Has patient fallen in last 6 months? 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  DIAGNOSTIC FINDINGS: n/a   PATIENT SURVEYS:  LEFS  Extreme difficulty/unable (0), Quite a bit of difficulty (1), Moderate difficulty (2), Little difficulty (3), No difficulty (4) Survey date:  10/06/23  Any of your usual work, housework or school activities 3  2. Usual hobbies, recreational or sporting activities 2  3. Getting into/out of the bath 4  4. Walking between rooms 4  5. Putting on socks/shoes 2  6. Squatting  1  7. Lifting an object, like a bag of groceries from the floor 2  8. Performing light activities around your home 2  9. Performing heavy activities around your home 2  10. Getting into/out of a car 2  11. Walking 2 blocks 2  12. Walking  1 mile 1  13. Going up/down 10 stairs (1 flight) 3  14. Standing for 1 hour 2  15.  sitting for 1 hour 2  16. Running on even ground 0  17. Running on uneven ground 0  18. Making sharp turns while running fast 0  19. Hopping  0  20. Rolling over in bed 2  Score total:  36/80     EDEMA:  Mild edema  MUSCLE LENGTH: Did not assess -- see knee ROM  POSTURE: flexed trunk   PALPATION: No unexpected tenderness to palpation  LOWER EXTREMITY  ROM:  Active/Passive ROM Right eval Left eval  Hip flexion    Hip extension    Hip abduction    Hip adduction    Hip internal rotation    Hip external rotation    Knee flexion 95/102 127/127  Knee extension -20/-9 0/0  Ankle dorsiflexion    Ankle plantarflexion    Ankle inversion    Ankle eversion     (Blank rows = not tested)  LOWER EXTREMITY MMT:  MMT Right eval Left eval  Hip flexion 4+ 5  Hip extension 4- 4  Hip abduction 3+ 4  Hip adduction    Hip internal rotation    Hip external rotation    Knee flexion 4+ 5  Knee extension 4+ 5  Ankle dorsiflexion    Ankle plantarflexion    Ankle inversion    Ankle eversion     (Blank rows = not tested)  LOWER EXTREMITY SPECIAL TESTS:  Did not assess  FUNCTIONAL TESTS:  5 times sit to stand: 13.78 sec with increased weight shift to L LE but no UE support SLS: L 43.15 sec, R 4.16 sec  GAIT: Distance walked: ~100' Assistive device utilized: tri tip cane Level of assistance: SBA Comments: R LE slightly externally rotated, R knee slightly flexed throughout gait, cane on L, diminished heel strike and hip ext during stance                                                                                                                                TREATMENT DATE:   10/25/23:                                     EXERCISE LOG  Exercise Repetitions and Resistance Comments  Recumbent bike  16 minutes   Knee ext 10# x 3 minutes   Ham curls 40# x 3 minutes   Leg Press 2 plates x 3 minutes        In supine:  1-1 stretching focused on extension (13 minutes total) including sustained right hamstring, calf and over pressure stretching f/b  LE elevation and vasopneumatic x 14 minutes on medium.     10/20/23:  EXERCISE LOG  Exercise Repetitions and Resistance Comments      Recumbent bike Level 2 beginning at seat 9 and finishing at seat 7 x 16 minutes   Rocker board X 5 mins  ROM and  balance   LAQs 2# 3x10   HS curl Red 3x10       STW/M  to quad and posterior knee and PROM for Ext ROM progression. Flexion to 118 degrees today  LE elevation and vasopneumatic x 15 minutes on medium.    10/13/23:                                       EXERCISE LOG  Exercise Repetitions and Resistance Comments  Nustep Level 3 x 15 minutes moving seat forward x 2 to increase flexion.     Rockerboard In parallel bars x 4 minutes.               PROM in supine x 8 minutes with LLLDS technique into left knee flexion and extension f/b LE elevation x 15 minutes.    10/11/23 Nustep L3 x 12 min total, starting at seat 12 to seat 8 Seated knee flexion self ROM 10x5 Seated hamstring curl red TB 2x10 Seated hamstring stretch 2x30 Seated SLR 2x10 Standing hip abd red TB 2x10 on R; no resistance on L Standing hip ext red TB 2x10 on R; no resistance on L Gait training: Amb with narrower BOS, equal step length and focus on R LE heel strike/knee ext with and without [facility]  10/06/23 Education only   PATIENT EDUCATION:  Education details: See below. Person educated: Patient and Spouse Education method: Explanation, Demonstration, and Handouts Education comprehension: verbalized understanding, returned demonstration, and needs further education  HOME EXERCISE PROGRAM: HOME EXERCISE PROGRAM [[id number]]  PRONE KNEE HANGS -  Repeat 1 Repetition, Hold 10 Minutes, Complete 1 Set, Perform 3 Times a Day  Exercises - Seated Knee Flexion AAROM  - 1 x daily - 7 x weekly - 1 sets - 10 reps - 5 hold - Seated Hamstring Curls with Resistance  - 1 x daily - 7 x weekly - 2 sets - 10 reps - 3 sec hold - Seated Hamstring Stretch  - 1 x daily - 7 x weekly - 2 sets - 30 sec hold - Seated Active Straight-Leg Raise  - 1 x daily - 7 x weekly - 2 sets - 10 reps - Hip Abduction with Resistance Loop  - 1 x daily - 7 x weekly - 2 sets - 10 reps - 3 sec hold - Hip Extension with Resistance Loop  - 1 x daily - 7 x  weekly - 2 sets - 10 reps - 3 sec hold  ASSESSMENT:  CLINICAL IMPRESSION: The patient is progressing well.  Added circuit weight machines today which the patient performed with excellent technique and without complaint.  OBJECTIVE IMPAIRMENTS: Abnormal gait, decreased activity tolerance, decreased balance, decreased endurance, decreased mobility, difficulty walking, decreased ROM, decreased strength, hypomobility, increased edema, increased fascial restrictions, increased muscle spasms, improper body mechanics, postural dysfunction, and pain.   ACTIVITY LIMITATIONS: carrying, lifting, bending, sitting, standing, squatting, sleeping, stairs, transfers, bed mobility, bathing, toileting, hygiene/grooming, and locomotion level  PARTICIPATION LIMITATIONS: meal prep, cleaning, driving, shopping, community activity, occupation, and yard work  PERSONAL FACTORS: Age, Fitness, Past/current experiences, and Time since onset of injury/illness/exacerbation are also affecting patient's functional outcome.   REHAB  POTENTIAL: Good  CLINICAL DECISION MAKING: Evolving/moderate complexity  EVALUATION COMPLEXITY: Moderate   GOALS: Goals reviewed with patient? Yes  SHORT TERM GOALS: Target date: 11/03/2023  Patient will be independent with initial HEP. Baseline: From HHPT Goal status: INITIAL   LONG TERM GOALS: Target date: 12/01/2023   Patient will be independent with advanced/ongoing HEP to improve outcomes and carryover.  Baseline: Not yet provided Goal status: INITIAL  2.  Patient will report at least 75% improvement in R knee pain to improve QOL. Baseline: 1-2 Goal status: INITIAL  3.  Patient will demonstrate improved R knee AROM to >/= 5-120 deg to allow for normal gait and stair mechanics. Baseline: 20-95 AROM       118 degrees Goal status: INITIAL  4.  Patient will demonstrate improved functional LE strength as demonstrated by 5 x STS of </= 10 sec. Baseline: 13 Goal status:  INITIAL  5.  Patient will be able to ambulate 600' with LRAD and normal gait pattern without increased pain to access community.  Baseline: 100' with SPC Goal status: INITIAL  6. Patient will be able to ascend/descend stairs without HR using reciprocal step pattern safely to access home and community.  Baseline: Able to ascend/descend 4 steps with 1 HR and cane reciprocally Goal status: INITIAL  7.  Patient will report 48 on LEFS (patient reported outcome measure) to demonstrate improved functional ability. Baseline: 36/80 Goal status: INITIAL      PLAN:  PT FREQUENCY: 2x/week  PT DURATION: 8 weeks  PLANNED INTERVENTIONS: 97164- PT Re-evaluation, 97750- Physical Performance Testing, 97110-Therapeutic exercises, 97530- Therapeutic activity, [id number]- Neuromuscular re-education, 97535- Self Care, 02859- Manual therapy, [phone]- Gait training, [phone]- Electrical stimulation (unattended), 97016- Vasopneumatic device, [id number]- Ionotophoresis 4mg /ml Dexamethasone , 79439 (1-2 muscles), 20561 (3+ muscles)- Dry Needling, Patient/Family education, Balance training, Taping, Joint mobilization, Spinal mobilization, Cryotherapy, and Moist heat  PLAN FOR NEXT SESSION: Review HEP for form. Work on knee ROM and strength. Standing terminal knee ext. Gait training with cane indoor/outdoor/stairs   Accalia Rigdon, [place], PT, 10/25/2023, 3:49 PM

## 2023-10-27 ENCOUNTER — Ambulatory Visit: Admitting: Physical Therapy

## 2023-10-27 DIAGNOSIS — M25561 Pain in right knee: Secondary | ICD-10-CM

## 2023-10-27 DIAGNOSIS — M25661 Stiffness of right knee, not elsewhere classified: Secondary | ICD-10-CM | POA: Diagnosis not present

## 2023-10-27 NOTE — Therapy (Signed)
 OUTPATIENT PHYSICAL THERAPY TREATMENT   Patient Name: Fernando Pratt MRN: [id number] DOB:February 24, 1945, 78 y.o., male Today's Date: 10/27/2023  END OF SESSION:  PT End of Session - 10/27/23 1044     Visit Number 7    Number of Visits 16    Date for Recertification  12/01/23    PT Start Time 0931    PT Stop Time 1034    PT Time Calculation (min) 63 min    Activity Tolerance Patient tolerated treatment well    Behavior During Therapy [facility] for tasks assessed/performed            Past Medical History:  Diagnosis Date   Arthritis    Complication of anesthesia    slow to wake up after neck surgery   Hyperlipidemia    Past Surgical History:  Procedure Laterality Date   COLONOSCOPY     NECK SURGERY  01/03/1989   TOTAL KNEE ARTHROPLASTY Right 09/22/2023   Procedure: ARTHROPLASTY, KNEE, TOTAL;  Surgeon: Vernetta Lonni GRADE, MD;  Location: [facility];  Service: Orthopedics;  Laterality: Right;   Patient Active Problem List   Diagnosis Date Noted   Status post total right knee replacement 09/22/2023   Unilateral primary osteoarthritis, right knee 09/21/2023   Spondylosis, cervical, with myelopathy 09/22/2015   Pain in joint of left shoulder 09/22/2015   Hyperlipidemia 09/22/2015   Neuropathy 09/22/2015   Vitamin D  deficiency 09/22/2015    PCP: Zollie Lowers, MD  REFERRING PROVIDER: Vernetta Lonni GRADE, MD  REFERRING DIAG: M17.11 (ICD-10-CM) - Unilateral primary osteoarthritis, right knee Z96.651 (ICD-10-CM) - Status post total right knee replacement  THERAPY DIAG:  Stiffness of right knee, not elsewhere classified  Acute pain of right knee  Rationale for Evaluation and Treatment: Rehabilitation  ONSET DATE: 09/22/23 surgery date  SUBJECTIVE:   SUBJECTIVE STATEMENT: Doing good.  PERTINENT HISTORY: History of neck surgery, R achilles injury  PAIN:  Are you having pain? Yes: NPRS scale: 1-2 constant Pain location: Along R knee joint line Pain  description: throbbing Aggravating factors: Exercise Relieving factors: Ice   PRECAUTIONS: None  RED FLAGS: None   WEIGHT BEARING RESTRICTIONS: No  FALLS:  Has patient fallen in last 6 months? 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  DIAGNOSTIC FINDINGS: n/a   PATIENT SURVEYS:  LEFS  Extreme difficulty/unable (0), Quite a bit of difficulty (1), Moderate difficulty (2), Little difficulty (3), No difficulty (4) Survey date:  10/06/23  Any of your usual work, housework or school activities 3  2. Usual hobbies, recreational or sporting activities 2  3. Getting into/out of the bath 4  4. Walking between rooms 4  5. Putting on socks/shoes 2  6. Squatting  1  7. Lifting an object, like a bag of groceries from the floor 2  8. Performing light activities around your home 2  9. Performing heavy activities around your home 2  10. Getting into/out of a car 2  11. Walking 2 blocks 2  12. Walking 1 mile 1  13. Going up/down 10 stairs (1 flight) 3  14. Standing  for 1 hour 2  15.  sitting for 1 hour 2  16. Running on even ground 0  17. Running on uneven ground 0  18. Making sharp turns while running fast 0  19. Hopping  0  20. Rolling over in bed 2  Score total:  36/80     EDEMA:  Mild edema  MUSCLE LENGTH: Did not assess -- see knee ROM  POSTURE: flexed trunk   PALPATION: No unexpected tenderness to palpation  LOWER EXTREMITY ROM:  Active/Passive ROM Right eval Left eval  Hip flexion    Hip extension    Hip  abduction    Hip adduction    Hip internal rotation    Hip external rotation    Knee flexion 95/102 127/127  Knee extension -20/-9 0/0  Ankle dorsiflexion    Ankle plantarflexion    Ankle inversion    Ankle eversion     (Blank rows = not tested)  LOWER EXTREMITY MMT:  MMT Right eval Left eval  Hip flexion 4+ 5  Hip extension 4- 4  Hip abduction 3+ 4  Hip adduction    Hip internal rotation    Hip external rotation    Knee flexion 4+ 5  Knee extension 4+ 5  Ankle dorsiflexion    Ankle plantarflexion    Ankle inversion    Ankle eversion     (Blank rows = not tested)  LOWER EXTREMITY SPECIAL TESTS:  Did not assess  FUNCTIONAL TESTS:  5 times sit to stand: 13.78 sec with increased weight shift to L LE but no UE support SLS: L 43.15 sec, R 4.16 sec  GAIT: Distance walked: ~100' Assistive device utilized: tri tip cane Level of assistance: SBA Comments: R LE slightly externally rotated, R knee slightly flexed throughout gait, cane on L, diminished heel strike and hip ext during stance                                                                                                                                TREATMENT DATE:    10/27/23:                                     EXERCISE LOG  Exercise Repetitions and Resistance Comments  Recumbent bike 16 minutes progressing to seat 6   Knee ext 10# x 3 minutes   Ham curls 40# x 3 minutes   Leg Press 3 plates x 3 minutes       In supine:  1-1 stretching focused on extension (13 minutes total) including sustained right hamstring, calf and over pressure stretching f/b  LE elevation and vasopneumatic x 15 minutes on medium.      10/25/23:  EXERCISE LOG  Exercise Repetitions and Resistance Comments  Recumbent bike  16 minutes   Knee ext 10# x 3 minutes   Ham curls 40# x 3 minutes   Leg Press 2 plates x 3 minutes        In supine:  1-1 stretching focused on extension (13  minutes total) including sustained right hamstring, calf and over pressure stretching f/b  LE elevation and vasopneumatic x 14 minutes on medium.     10/20/23:                                     EXERCISE LOG  Exercise Repetitions and Resistance Comments      Recumbent bike Level 2 beginning at seat 9 and finishing at seat 7 x 16 minutes   Rocker board X 5 mins  ROM and balance   LAQs 2# 3x10   HS curl Red 3x10       STW/M  to quad and posterior knee and PROM for Ext ROM progression. Flexion to 118 degrees today  LE elevation and vasopneumatic x 15 minutes on medium.    10/13/23:                                       EXERCISE LOG  Exercise Repetitions and Resistance Comments  Nustep Level 3 x 15 minutes moving seat forward x 2 to increase flexion.     Rockerboard In parallel bars x 4 minutes.               PROM in supine x 8 minutes with LLLDS technique into left knee flexion and extension f/b LE elevation x 15 minutes.    10/11/23 Nustep L3 x 12 min total, starting at seat 12 to seat 8 Seated knee flexion self ROM 10x5 Seated hamstring curl red TB 2x10 Seated hamstring stretch 2x30 Seated SLR 2x10 Standing hip abd red TB 2x10 on R; no resistance on L Standing hip ext red TB 2x10 on R; no resistance on L Gait training: Amb with narrower BOS, equal step length and focus on R LE heel strike/knee ext with and without [facility]  10/06/23 Education only   PATIENT EDUCATION:  Education details: See below. Person educated: Patient and Spouse Education method: Explanation, Demonstration, and Handouts Education comprehension: verbalized understanding, returned demonstration, and needs further education  HOME EXERCISE PROGRAM: HOME EXERCISE PROGRAM [[id number]]  PRONE KNEE HANGS -  Repeat 1 Repetition, Hold 10 Minutes, Complete 1 Set, Perform 3 Times a Day  Exercises - Seated Knee Flexion AAROM  - 1 x daily - 7 x weekly - 1 sets - 10 reps - 5 hold - Seated Hamstring Curls with  Resistance  - 1 x daily - 7 x weekly - 2 sets - 10 reps - 3 sec hold - Seated Hamstring Stretch  - 1 x daily - 7 x weekly - 2 sets - 30 sec hold - Seated Active Straight-Leg Raise  - 1 x daily - 7 x weekly - 2 sets - 10 reps - Hip Abduction with Resistance Loop  - 1 x daily - 7 x weekly - 2 sets - 10 reps - 3 sec hold - Hip Extension with Resistance Loop  - 1 x daily - 7 x weekly - 2 sets - 10 reps - 3 sec hold  ASSESSMENT:  CLINICAL IMPRESSION: The patient is progressing well.  Added weight to leg press exercise which patient performed without complaint and excellent technique.    OBJECTIVE IMPAIRMENTS: Abnormal gait, decreased activity tolerance, decreased balance, decreased endurance, decreased mobility, difficulty walking, decreased ROM, decreased strength, hypomobility, increased edema, increased fascial restrictions, increased muscle spasms, improper body mechanics, postural dysfunction, and pain.   ACTIVITY LIMITATIONS: carrying, lifting, bending, sitting, standing, squatting, sleeping, stairs, transfers, bed mobility, bathing, toileting, hygiene/grooming, and locomotion level  PARTICIPATION LIMITATIONS: meal prep, cleaning, driving, shopping, community activity, occupation, and yard work  PERSONAL FACTORS: Age, Fitness, Past/current experiences, and Time since onset of injury/illness/exacerbation are also affecting patient's functional outcome.   REHAB POTENTIAL: Good  CLINICAL DECISION MAKING: Evolving/moderate complexity  EVALUATION COMPLEXITY: Moderate   GOALS: Goals reviewed with patient? Yes  SHORT TERM GOALS: Target date: 11/03/2023  Patient will be independent with initial HEP. Baseline: From HHPT Goal status: INITIAL   LONG TERM GOALS: Target date: 12/01/2023   Patient will be independent with advanced/ongoing HEP to improve outcomes and carryover.  Baseline: Not yet provided Goal status: INITIAL  2.  Patient will report at least 75% improvement in R knee  pain to improve QOL. Baseline: 1-2 Goal status: INITIAL  3.  Patient will demonstrate improved R knee AROM to >/= 5-120 deg to allow for normal gait and stair mechanics. Baseline: 20-95 AROM       118 degrees Goal status: INITIAL  4.  Patient will demonstrate improved functional LE strength as demonstrated by 5 x STS of </= 10 sec. Baseline: 13 Goal status: INITIAL  5.  Patient will be able to ambulate 600' with LRAD and normal gait pattern without increased pain to access community.  Baseline: 100' with SPC Goal status: INITIAL  6. Patient will be able to ascend/descend stairs without HR using reciprocal step pattern safely to access home and community.  Baseline: Able to ascend/descend 4 steps with 1 HR and cane reciprocally Goal status: INITIAL  7.  Patient will report 48 on LEFS (patient reported outcome measure) to demonstrate improved functional ability. Baseline: 36/80 Goal status: INITIAL      PLAN:  PT FREQUENCY: 2x/week  PT DURATION: 8 weeks  PLANNED INTERVENTIONS: 97164- PT Re-evaluation, 97750- Physical Performance Testing, 97110-Therapeutic exercises, 97530- Therapeutic activity, [id number]- Neuromuscular re-education, 97535- Self Care, 02859- Manual therapy, [phone]- Gait training, [phone]- Electrical stimulation (unattended), 97016- Vasopneumatic device, [id number]- Ionotophoresis 4mg /ml Dexamethasone , 79439 (1-2 muscles), 20561 (3+ muscles)- Dry Needling, Patient/Family education, Balance training, Taping, Joint mobilization, Spinal mobilization, Cryotherapy, and Moist heat  PLAN FOR NEXT SESSION: Review HEP for form. Work on knee ROM and strength. Standing terminal knee ext. Gait training with cane indoor/outdoor/stairs   Sherina Stammer, [place], PT, 10/27/2023, 10:50 AM

## 2023-11-01 ENCOUNTER — Ambulatory Visit

## 2023-11-01 DIAGNOSIS — M6281 Muscle weakness (generalized): Secondary | ICD-10-CM

## 2023-11-01 DIAGNOSIS — R2689 Other abnormalities of gait and mobility: Secondary | ICD-10-CM

## 2023-11-01 DIAGNOSIS — M25661 Stiffness of right knee, not elsewhere classified: Secondary | ICD-10-CM | POA: Diagnosis not present

## 2023-11-01 DIAGNOSIS — M25561 Pain in right knee: Secondary | ICD-10-CM

## 2023-11-01 NOTE — Therapy (Signed)
 OUTPATIENT PHYSICAL THERAPY TREATMENT   Patient Name: Fernando Pratt MRN: [id number] DOB:[birth year], 78 y.o., male Today's Date: 11/01/2023  END OF SESSION:  PT End of Session - 11/01/23 0850     Visit Number 8    Number of Visits 16    Date for Recertification  12/01/23    PT Start Time 0849    PT Stop Time 0950    PT Time Calculation (min) 61 min    Activity Tolerance Patient tolerated treatment well    Behavior During Therapy [facility] for tasks assessed/performed            Past Medical History:  Diagnosis Date   Arthritis    Complication of anesthesia    slow to wake up after neck surgery   Hyperlipidemia    Past Surgical History:  Procedure Laterality Date   COLONOSCOPY     NECK SURGERY  01/03/1989   TOTAL KNEE ARTHROPLASTY Right 09/22/2023   Procedure: ARTHROPLASTY, KNEE, TOTAL;  Surgeon: Vernetta Lonni GRADE, MD;  Location: [facility];  Service: Orthopedics;  Laterality: Right;   Patient Active Problem List   Diagnosis Date Noted   Status post total right knee replacement 09/22/2023   Unilateral primary osteoarthritis, right knee 09/21/2023   Spondylosis, cervical, with myelopathy 09/22/2015   Pain in joint of left shoulder 09/22/2015   Hyperlipidemia 09/22/2015   Neuropathy 09/22/2015   Vitamin D  deficiency 09/22/2015    PCP: Zollie Lowers, MD  REFERRING PROVIDER: Vernetta Lonni GRADE, MD  REFERRING DIAG: M17.11 (ICD-10-CM) - Unilateral primary osteoarthritis, right knee Z96.651 (ICD-10-CM) - Status post total right knee replacement  THERAPY DIAG:  Stiffness of right knee, not elsewhere classified  Acute pain of right knee  Muscle weakness (generalized)  Other abnormalities of gait and mobility  Rationale for Evaluation and Treatment: Rehabilitation  ONSET DATE: 09/22/23 surgery date  SUBJECTIVE:   SUBJECTIVE STATEMENT: Pt denies any pain today.   PERTINENT HISTORY: History of neck surgery, R achilles injury  PAIN:  Are you having  pain? No  PRECAUTIONS: None  RED FLAGS: None   WEIGHT BEARING RESTRICTIONS: No  FALLS:  Has patient fallen in last 6 months? 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  DIAGNOSTIC FINDINGS: n/a   PATIENT SURVEYS:  LEFS  Extreme difficulty/unable (0), Quite a bit of difficulty (1), Moderate difficulty (2), Little difficulty (3), No difficulty (4) Survey date:  10/06/23  Any of your usual work, housework or school activities 3  2. Usual hobbies, recreational or sporting activities 2  3. Getting into/out of the bath 4  4. Walking between rooms 4  5. Putting on socks/shoes 2  6. Squatting  1  7. Lifting an object, like a bag of groceries from the floor 2  8. Performing light activities around your home 2  9. Performing heavy activities around your home 2  10. Getting into/out of a car 2  11. Walking 2 blocks 2  12. Walking 1 mile 1  13. Going up/down 10 stairs (1 flight) 3  14. Standing for 1 hour 2  15.  sitting for 1 hour 2  16. Running on even ground 0  17. Running on uneven ground 0  18. Making sharp turns while running fast 0  19. Hopping  0  20. Rolling over in bed 2  Score total:  36/80     EDEMA:  Mild edema  MUSCLE LENGTH: Did not assess -- see knee ROM  POSTURE: flexed trunk   PALPATION: No unexpected tenderness to palpation  LOWER EXTREMITY ROM:  Active/Passive ROM Right eval Left eval  Hip flexion    Hip extension    Hip abduction    Hip adduction    Hip internal rotation    Hip external  rotation    Knee flexion 95/102 127/127  Knee extension -20/-9 0/0  Ankle dorsiflexion    Ankle plantarflexion    Ankle inversion    Ankle eversion     (Blank rows = not tested)  LOWER EXTREMITY MMT:  MMT Right eval Left eval  Hip flexion 4+ 5  Hip extension 4- 4  Hip abduction 3+ 4  Hip adduction    Hip internal rotation    Hip external rotation    Knee flexion 4+ 5  Knee extension 4+ 5  Ankle dorsiflexion    Ankle plantarflexion    Ankle inversion    Ankle eversion     (Blank rows = not tested)  LOWER EXTREMITY SPECIAL TESTS:  Did not assess  FUNCTIONAL TESTS:  5 times sit to stand: 13.78 sec with increased weight shift to L LE but no UE support SLS: L 43.15 sec, R 4.16 sec  GAIT: Distance walked: ~100' Assistive device utilized: tri tip cane Level of assistance: SBA Comments: R LE slightly externally rotated, R knee slightly flexed throughout gait, cane on L, diminished heel strike and hip ext during stance                                                                                                                                TREATMENT DATE:    11/01/23:                          EXERCISE LOG  Exercise Repetitions and Resistance Comments  Recumbent bike Lvl 3 x 16 mins; seat 7-5   Knee ext 10# x 4 minutes   Ham curls 40# x 4 minutes   Leg Press 3 plates x 4 minutes   Prolonged Extension Stretch    Goal Assessment See Below    Modalities  Date:  Vaso: Knee, 34 degrees; low pressure, 15 mins, Pain and Edema   10/25/23:                                     EXERCISE LOG  Exercise Repetitions and Resistance Comments  Recumbent bike  16 minutes   Knee ext 10#  x 3 minutes   Ham curls 40# x 3 minutes   Leg Press 2 plates x 3 minutes        In supine:  1-1 stretching focused on extension (13 minutes total) including sustained right hamstring, calf and over pressure stretching f/b  LE elevation and vasopneumatic x 14 minutes on medium.      10/20/23:                                     EXERCISE LOG  Exercise Repetitions and Resistance Comments      Recumbent bike Level 2 beginning at seat 9 and finishing at seat 7 x 16 minutes   Rocker board X 5 mins  ROM and balance   LAQs 2# 3x10   HS curl Red 3x10       STW/M  to quad and posterior knee and PROM for Ext ROM progression. Flexion to 118 degrees today  LE elevation and vasopneumatic x 15 minutes on medium.    PATIENT EDUCATION:  Education details: See below. Person educated: Patient and Spouse Education method: Explanation, Demonstration, and Handouts Education comprehension: verbalized understanding, returned demonstration, and needs further education  HOME EXERCISE PROGRAM: HOME EXERCISE PROGRAM [[id number]]  PRONE KNEE HANGS -  Repeat 1 Repetition, Hold 10 Minutes, Complete 1 Set, Perform 3 Times a Day  Exercises - Seated Knee Flexion AAROM  - 1 x daily - 7 x weekly - 1 sets - 10 reps - 5 hold - Seated Hamstring Curls with Resistance  - 1 x daily - 7 x weekly - 2 sets - 10 reps - 3 sec hold - Seated Hamstring Stretch  - 1 x daily - 7 x weekly - 2 sets - 30 sec hold - Seated Active Straight-Leg Raise  - 1 x daily - 7 x weekly - 2 sets - 10 reps - Hip Abduction with Resistance Loop  - 1 x daily - 7 x weekly - 2 sets - 10 reps - 3 sec hold - Hip Extension with Resistance Loop  - 1 x daily - 7 x weekly - 2 sets - 10 reps - 3 sec hold  ASSESSMENT:  CLINICAL IMPRESSION: Pt arrives for today's treatment session denying any pain.  Pt able to increase LEFs score to 62/80 today meeting his LTG.  Pt able to demonstrate -4 to 123 degrees of right knee flexion today, meeting his ROM goal as well.  Pt requires us  of one hand rail when navigating stairs for safety and stability.  Pts 5 STS time decreased to 12.2 seconds making progress towards his goal.  Pt able to tolerate increased time with all cybex exercises today with minimal fatigue noted.  Normal responses to vaso  noted upon removal.  Pt denied any pain at completion of today's treatment session.  OBJECTIVE IMPAIRMENTS: Abnormal gait, decreased activity tolerance, decreased balance, decreased endurance, decreased mobility, difficulty walking, decreased ROM, decreased strength, hypomobility, increased edema, increased fascial restrictions, increased muscle spasms, improper body mechanics, postural dysfunction, and pain.   ACTIVITY LIMITATIONS: carrying, lifting, bending, sitting, standing, squatting, sleeping, stairs, transfers, bed mobility, bathing, toileting, hygiene/grooming, and locomotion level  PARTICIPATION LIMITATIONS: meal prep, cleaning, driving, shopping, community activity, occupation, and yard work  PERSONAL FACTORS: Age, Fitness, Past/current experiences, and Time since onset of injury/illness/exacerbation are also affecting patient's functional outcome.   REHAB POTENTIAL: Good  CLINICAL DECISION MAKING: Evolving/moderate complexity  EVALUATION COMPLEXITY:  Moderate   GOALS: Goals reviewed with patient? Yes  SHORT TERM GOALS: Target date: 11/03/2023  Patient will be independent with initial HEP. Baseline: From HHPT Goal status: MET   LONG TERM GOALS: Target date: 12/01/2023   Patient will be independent with advanced/ongoing HEP to improve outcomes and carryover.  Baseline: Not yet provided Goal status: IN PROGRESS  2.  Patient will report at least 75% improvement in R knee pain to improve QOL. Baseline: 1-2 Goal status: MET  3.  Patient will demonstrate improved R knee AROM to >/= 5-120 deg to allow for normal gait and stair mechanics. Baseline: 20-95 AROM       118 degrees; 10/29: -4-123 degrees Goal status: MET  4.  Patient will demonstrate improved functional LE strength as demonstrated by 5 x STS of </= 10 sec. Baseline: 13; 10/29: 12.2 seconds Goal status: IN PROGRESS  5.  Patient will be able to ambulate 600' with LRAD and normal gait pattern without increased  pain to access community.  Baseline: 100' with SPC Goal status: MET  6. Patient will be able to ascend/descend stairs without HR using reciprocal step pattern safely to access home and community.  Baseline: Able to ascend/descend 4 steps with 1 HR and cane reciprocally; 10/29: requires 1 HR Goal status: IN PROGRESS  7.  Patient will report 48 on LEFS (patient reported outcome measure) to demonstrate improved functional ability. Baseline: 36/80; 10/29: 62/80 Goal status: MET      PLAN:  PT FREQUENCY: 2x/week  PT DURATION: 8 weeks  PLANNED INTERVENTIONS: 97164- PT Re-evaluation, 97750- Physical Performance Testing, 97110-Therapeutic exercises, 97530- Therapeutic activity, [id number]- Neuromuscular re-education, 97535- Self Care, 02859- Manual therapy, [id number]- Gait training, [phone]- Electrical stimulation (unattended), 97016- Vasopneumatic device, [id number]- Ionotophoresis 4mg /ml Dexamethasone , 79439 (1-2 muscles), 20561 (3+ muscles)- Dry Needling, Patient/Family education, Balance training, Taping, Joint mobilization, Spinal mobilization, Cryotherapy, and Moist heat  PLAN FOR NEXT SESSION: Review HEP for form. Work on knee ROM and strength. Standing terminal knee ext. Gait training with cane indoor/outdoor/stairs   Delon DELENA Gosling, PTA, 11/01/2023, 10:19 AM   Progress Note Reporting Period 10/06/23 to 11/01/23  See note below for Objective Data and Assessment of Progress/Goals. Excellent progress toward goals and he has currently met LTG's #2,3,5 and 7.   Chad Applegate MPT

## 2023-11-03 ENCOUNTER — Ambulatory Visit: Admitting: Physical Therapy

## 2023-11-03 DIAGNOSIS — R2689 Other abnormalities of gait and mobility: Secondary | ICD-10-CM

## 2023-11-03 DIAGNOSIS — M6281 Muscle weakness (generalized): Secondary | ICD-10-CM

## 2023-11-03 DIAGNOSIS — M25661 Stiffness of right knee, not elsewhere classified: Secondary | ICD-10-CM | POA: Diagnosis not present

## 2023-11-03 DIAGNOSIS — M25561 Pain in right knee: Secondary | ICD-10-CM

## 2023-11-03 NOTE — Therapy (Signed)
 OUTPATIENT PHYSICAL THERAPY TREATMENT   Patient Name: Fernando Pratt MRN: [id number] DOB:[birth year], 78 y.o., male Today's Date: 11/03/2023  END OF SESSION:  PT End of Session - 11/03/23 0925     Visit Number 9    Number of Visits 16    Date for Recertification  12/01/23    PT Start Time 0850    PT Stop Time 0955    PT Time Calculation (min) 65 min    Activity Tolerance Patient tolerated treatment well    Behavior During Therapy [facility] for tasks assessed/performed             Past Medical History:  Diagnosis Date   Arthritis    Complication of anesthesia    slow to wake up after neck surgery   Hyperlipidemia    Past Surgical History:  Procedure Laterality Date   COLONOSCOPY     NECK SURGERY  01/03/1989   TOTAL KNEE ARTHROPLASTY Right 09/22/2023   Procedure: ARTHROPLASTY, KNEE, TOTAL;  Surgeon: Vernetta Lonni GRADE, MD;  Location: [facility];  Service: Orthopedics;  Laterality: Right;   Patient Active Problem List   Diagnosis Date Noted   Status post total right knee replacement 09/22/2023   Unilateral primary osteoarthritis, right knee 09/21/2023   Spondylosis, cervical, with myelopathy 09/22/2015   Pain in joint of left shoulder 09/22/2015   Hyperlipidemia 09/22/2015   Neuropathy 09/22/2015   Vitamin D  deficiency 09/22/2015    PCP: Zollie Lowers, MD  REFERRING PROVIDER: Vernetta Lonni GRADE, MD  REFERRING DIAG: M17.11 (ICD-10-CM) - Unilateral primary osteoarthritis, right knee Z96.651 (ICD-10-CM) - Status post total right knee replacement  THERAPY DIAG:  Stiffness of right knee, not elsewhere classified  Acute pain of right knee  Muscle weakness (generalized)  Other abnormalities of gait and mobility  Rationale for Evaluation and Treatment: Rehabilitation  ONSET DATE: 09/22/23 surgery date  SUBJECTIVE:   SUBJECTIVE STATEMENT: Pt denies any pain today.   PERTINENT HISTORY: History of neck surgery, R achilles injury  PAIN:  Are you having  pain? No  PRECAUTIONS: None  RED FLAGS: None   WEIGHT BEARING RESTRICTIONS: No  FALLS:  Has patient fallen in last 6 months? 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  DIAGNOSTIC FINDINGS: n/a   PATIENT SURVEYS:  LEFS  Extreme difficulty/unable (0), Quite a bit of difficulty (1), Moderate difficulty (2), Little difficulty (3), No difficulty (4) Survey date:  10/06/23  Any of your usual work, housework or school activities 3  2. Usual hobbies, recreational or sporting activities 2  3. Getting into/out of the bath 4  4. Walking between rooms 4  5. Putting on socks/shoes 2  6. Squatting  1  7. Lifting an object, like a bag of groceries from the floor 2  8. Performing light activities around your home 2  9. Performing heavy activities around your home 2  10. Getting into/out of a car 2  11. Walking 2 blocks 2  12. Walking 1 mile 1  13. Going up/down 10 stairs (1 flight) 3  14. Standing for 1 hour 2  15.  sitting for 1 hour 2  16. Running on even ground 0  17. Running on uneven ground 0  18. Making sharp turns while running fast 0  19. Hopping  0  20. Rolling over in bed 2  Score total:  36/80     EDEMA:  Mild edema  MUSCLE LENGTH: Did not assess -- see knee ROM  POSTURE: flexed trunk   PALPATION: No unexpected tenderness to palpation  LOWER EXTREMITY ROM:  Active/Passive ROM Right eval Left eval  Hip flexion    Hip extension    Hip abduction    Hip adduction    Hip internal rotation    Hip external  rotation    Knee flexion 95/102 127/127  Knee extension -20/-9 0/0  Ankle dorsiflexion    Ankle plantarflexion    Ankle inversion    Ankle eversion     (Blank rows = not tested)  LOWER EXTREMITY MMT:  MMT Right eval Left eval  Hip flexion 4+ 5  Hip extension 4- 4  Hip abduction 3+ 4  Hip adduction    Hip internal rotation    Hip external rotation    Knee flexion 4+ 5  Knee extension 4+ 5  Ankle dorsiflexion    Ankle plantarflexion    Ankle inversion    Ankle eversion     (Blank rows = not tested)  LOWER EXTREMITY SPECIAL TESTS:  Did not assess  FUNCTIONAL TESTS:  5 times sit to stand: 13.78 sec with increased weight shift to L LE but no UE support SLS: L 43.15 sec, R 4.16 sec  GAIT: Distance walked: ~100' Assistive device utilized: tri tip cane Level of assistance: SBA Comments: R LE slightly externally rotated, R knee slightly flexed throughout gait, cane on L, diminished heel strike and hip ext during stance                                                                                                                                TREATMENT DATE:   11/03/23:                                     EXERCISE LOG  Exercise Repetitions and Resistance Comments  Recumbent bike 16 minutes   Knee ext 10# x 3 minutes   Ham curls 40# x 3 minutes   Leg press 3 plates x 3 minutes   Rockerboard  5 minutes   4 minutes right hamstring stretch x 4 minutes f/b 4 minutes overpressure stretch f/b LE elevation and vasopneumatic on medium.     11/01/23:                          EXERCISE LOG  Exercise Repetitions and Resistance Comments  Recumbent bike Lvl 3 x 16 mins; seat 7-5   Knee ext 10#  x 4 minutes   Ham curls 40# x 4 minutes   Leg Press 3 plates x 4 minutes   Prolonged Extension Stretch    Goal Assessment See Below    Modalities  Date:  Vaso: Knee, 34 degrees; low pressure, 15 mins, Pain and Edema     PATIENT EDUCATION:  Education details: See  below. Person educated: Patient and Spouse Education method: Explanation, Demonstration, and Handouts Education comprehension: verbalized understanding, returned demonstration, and needs further education  HOME EXERCISE PROGRAM: HOME EXERCISE PROGRAM [[id number]]  PRONE KNEE HANGS -  Repeat 1 Repetition, Hold 10 Minutes, Complete 1 Set, Perform 3 Times a Day  Exercises - Seated Knee Flexion AAROM  - 1 x daily - 7 x weekly - 1 sets - 10 reps - 5 hold - Seated Hamstring Curls with Resistance  - 1 x daily - 7 x weekly - 2 sets - 10 reps - 3 sec hold - Seated Hamstring Stretch  - 1 x daily - 7 x weekly - 2 sets - 30 sec hold - Seated Active Straight-Leg Raise  - 1 x daily - 7 x weekly - 2 sets - 10 reps - Hip Abduction with Resistance Loop  - 1 x daily - 7 x weekly - 2 sets - 10 reps - 3 sec hold - Hip Extension with Resistance Loop  - 1 x daily - 7 x weekly - 2 sets - 10 reps - 3 sec hold  ASSESSMENT:  CLINICAL IMPRESSION: Patient is highly motivated and did very well with treatment today and felt better following and noted improved gait following.    OBJECTIVE IMPAIRMENTS: Abnormal gait, decreased activity tolerance, decreased balance, decreased endurance, decreased mobility, difficulty walking, decreased ROM, decreased strength, hypomobility, increased edema, increased fascial restrictions, increased muscle spasms, improper body mechanics, postural dysfunction, and pain.   ACTIVITY LIMITATIONS: carrying, lifting, bending, sitting, standing, squatting, sleeping, stairs, transfers, bed mobility, bathing, toileting, hygiene/grooming, and locomotion level  PARTICIPATION LIMITATIONS: meal prep, cleaning, driving, shopping, community activity, occupation, and yard work  PERSONAL FACTORS: Age, Fitness, Past/current experiences, and Time since onset of injury/illness/exacerbation are also affecting patient's functional outcome.   REHAB POTENTIAL: Good  CLINICAL DECISION MAKING: Evolving/moderate  complexity  EVALUATION COMPLEXITY: Moderate   GOALS: Goals reviewed with patient? Yes  SHORT TERM GOALS: Target date: 11/03/2023  Patient will be independent with initial HEP. Baseline: From HHPT Goal status: MET   LONG TERM GOALS: Target date: 12/01/2023   Patient will be independent with advanced/ongoing HEP to improve outcomes and carryover.  Baseline: Not yet provided Goal status: IN PROGRESS  2.  Patient will report at least 75% improvement in R knee pain to improve QOL. Baseline: 1-2 Goal status: MET  3.  Patient will demonstrate improved R knee AROM to >/= 5-120 deg to allow for normal gait and stair mechanics. Baseline: 20-95 AROM       118 degrees; 10/29: -4-123 degrees Goal status: MET  4.  Patient will demonstrate improved functional LE strength as demonstrated by 5 x STS of </= 10 sec. Baseline: 13; 10/29: 12.2 seconds Goal status: IN PROGRESS  5.  Patient will be able to ambulate 600' with LRAD and normal gait pattern without increased pain to access community.  Baseline: 100' with SPC Goal status: MET  6. Patient will be able to ascend/descend stairs without HR using reciprocal step pattern safely to access home and community.  Baseline: Able to ascend/descend 4 steps with 1 HR and cane reciprocally; 10/29: requires  1 HR Goal status: IN PROGRESS  7.  Patient will report 48 on LEFS (patient reported outcome measure) to demonstrate improved functional ability. Baseline: 36/80; 10/29: 62/80 Goal status: MET      PLAN:  PT FREQUENCY: 2x/week  PT DURATION: 8 weeks  PLANNED INTERVENTIONS: 97164- PT Re-evaluation, 97750- Physical Performance Testing, 97110-Therapeutic exercises, 97530- Therapeutic activity, [id number]- Neuromuscular re-education, 97535- Self Care, 02859- Manual therapy, [id number]- Gait training, [phone]- Electrical stimulation (unattended), 97016- Vasopneumatic device, [id number]- Ionotophoresis 4mg /ml Dexamethasone , 79439 (1-2 muscles), 20561 (3+  muscles)- Dry Needling, Patient/Family education, Balance training, Taping, Joint mobilization, Spinal mobilization, Cryotherapy, and Moist heat  PLAN FOR NEXT SESSION: Review HEP for form. Work on knee ROM and strength. Standing terminal knee ext. Gait training with cane indoor/outdoor/stairs   Zyliah Schier, PT, 11/03/2023, 10:12 AM

## 2023-11-06 ENCOUNTER — Encounter: Payer: Self-pay | Admitting: Radiology

## 2023-11-08 ENCOUNTER — Ambulatory Visit: Attending: Orthopaedic Surgery

## 2023-11-08 ENCOUNTER — Ambulatory Visit: Admitting: Orthopaedic Surgery

## 2023-11-08 ENCOUNTER — Encounter: Payer: Self-pay | Admitting: Orthopaedic Surgery

## 2023-11-08 DIAGNOSIS — R2689 Other abnormalities of gait and mobility: Secondary | ICD-10-CM | POA: Diagnosis not present

## 2023-11-08 DIAGNOSIS — M25561 Pain in right knee: Secondary | ICD-10-CM | POA: Insufficient documentation

## 2023-11-08 DIAGNOSIS — M25661 Stiffness of right knee, not elsewhere classified: Secondary | ICD-10-CM | POA: Insufficient documentation

## 2023-11-08 DIAGNOSIS — M6281 Muscle weakness (generalized): Secondary | ICD-10-CM | POA: Insufficient documentation

## 2023-11-08 DIAGNOSIS — Z96651 Presence of right artificial knee joint: Secondary | ICD-10-CM

## 2023-11-08 NOTE — Progress Notes (Signed)
 The patient is now about 6 weeks status post a right total knee replacement to treat significant right knee pain and arthritis.  He is an active 78 year old gentleman.  He is walking without assistive device and reports good range of motion and strength.  He is still in physical therapy.  Examination of his right knee shows swelling to be expected.  His extension is almost full and his flexion is almost full.  The knee feels stable on exam.  From our standpoint we will see him back in 3 months with a standing AP and lateral of his right knee unless there are issues before then.

## 2023-11-08 NOTE — Therapy (Signed)
 OUTPATIENT PHYSICAL THERAPY TREATMENT   Patient Name: Fernando Pratt MRN: [id number] DOB:[birth year], 78 y.o., male Today's Date: 11/08/2023  END OF SESSION:  PT End of Session - 11/08/23 0850     Visit Number 10    Number of Visits 16    Date for Recertification  12/01/23    PT Start Time 0849    PT Stop Time 0950    PT Time Calculation (min) 61 min    Activity Tolerance Patient tolerated treatment well    Behavior During Therapy [facility] for tasks assessed/performed             Past Medical History:  Diagnosis Date   Arthritis    Complication of anesthesia    slow to wake up after neck surgery   Hyperlipidemia    Past Surgical History:  Procedure Laterality Date   COLONOSCOPY     NECK SURGERY  01/03/1989   TOTAL KNEE ARTHROPLASTY Right 09/22/2023   Procedure: ARTHROPLASTY, KNEE, TOTAL;  Surgeon: Vernetta Lonni GRADE, MD;  Location: [facility];  Service: Orthopedics;  Laterality: Right;   Patient Active Problem List   Diagnosis Date Noted   Status post total right knee replacement 09/22/2023   Unilateral primary osteoarthritis, right knee 09/21/2023   Spondylosis, cervical, with myelopathy 09/22/2015   Pain in joint of left shoulder 09/22/2015   Hyperlipidemia 09/22/2015   Neuropathy 09/22/2015   Vitamin D  deficiency 09/22/2015    PCP: Zollie Lowers, MD  REFERRING PROVIDER: Vernetta Lonni GRADE, MD  REFERRING DIAG: M17.11 (ICD-10-CM) - Unilateral primary osteoarthritis, right knee Z96.651 (ICD-10-CM) - Status post total right knee replacement  THERAPY DIAG:  Stiffness of right knee, not elsewhere classified  Acute pain of right knee  Muscle weakness (generalized)  Other abnormalities of gait and mobility  Rationale for Evaluation and Treatment: Rehabilitation  ONSET DATE: 09/22/23 surgery date  SUBJECTIVE:   SUBJECTIVE STATEMENT: Pt denies any pain today.   PERTINENT HISTORY: History of neck surgery, R achilles injury  PAIN:  Are you having  pain? No  PRECAUTIONS: None  RED FLAGS: None   WEIGHT BEARING RESTRICTIONS: No  FALLS:  Has patient fallen in last 6 months? 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  DIAGNOSTIC FINDINGS: n/a   PATIENT SURVEYS:  LEFS  Extreme difficulty/unable (0), Quite a bit of difficulty (1), Moderate difficulty (2), Little difficulty (3), No difficulty (4) Survey date:  10/06/23  Any of your usual work, housework or school activities 3  2. Usual hobbies, recreational or sporting activities 2  3. Getting into/out of the bath 4  4. Walking between rooms 4  5. Putting on socks/shoes 2  6. Squatting  1  7. Lifting an object, like a bag of groceries from the floor 2  8. Performing light activities around your home 2  9. Performing heavy activities around your home 2  10. Getting into/out of a car 2  11. Walking 2 blocks 2  12. Walking 1 mile 1  13. Going up/down 10 stairs (1 flight) 3  14. Standing for 1 hour 2  15.  sitting for 1 hour 2  16. Running on even ground 0  17. Running on uneven ground 0  18. Making sharp turns while running fast 0  19. Hopping  0  20. Rolling over in bed 2  Score total:  36/80     EDEMA:  Mild edema  MUSCLE LENGTH: Did not assess -- see knee ROM  POSTURE: flexed trunk   PALPATION: No unexpected tenderness to palpation  LOWER EXTREMITY ROM:  Active/Passive ROM Right eval Left eval  Hip flexion    Hip extension    Hip abduction    Hip adduction    Hip internal rotation    Hip external  rotation    Knee flexion 95/102 127/127  Knee extension -20/-9 0/0  Ankle dorsiflexion    Ankle plantarflexion    Ankle inversion    Ankle eversion     (Blank rows = not tested)  LOWER EXTREMITY MMT:  MMT Right eval Left eval  Hip flexion 4+ 5  Hip extension 4- 4  Hip abduction 3+ 4  Hip adduction    Hip internal rotation    Hip external rotation    Knee flexion 4+ 5  Knee extension 4+ 5  Ankle dorsiflexion    Ankle plantarflexion    Ankle inversion    Ankle eversion     (Blank rows = not tested)  LOWER EXTREMITY SPECIAL TESTS:  Did not assess  FUNCTIONAL TESTS:  5 times sit to stand: 13.78 sec with increased weight shift to L LE but no UE support SLS: L 43.15 sec, R 4.16 sec  GAIT: Distance walked: ~100' Assistive device utilized: tri tip cane Level of assistance: SBA Comments: R LE slightly externally rotated, R knee slightly flexed throughout gait, cane on L, diminished heel strike and hip ext during stance                                                                                                                                TREATMENT DATE:   11/08/23:                                   EXERCISE LOG  Exercise Repetitions and Resistance Comments  Recumbent bike Lvl 3 x 16 minutes; seat 5   Knee ext 10# x 4 minutes   Ham curls 40# x 4 minutes   Leg press 3 plates x 4 minutes   Lunges 14 box x 4 mins   Prolonged Extension Stretch Zero Knee x 4 mins   Flexion with Over pressure 4  mins    Modalities  Date:  Vaso: Knee, 34 degrees; medium pressure, 15 mins, Pain and Edema  11/01/23:                          EXERCISE LOG  Exercise Repetitions and  Resistance Comments  Recumbent bike Lvl 3 x 16 mins; seat 7-5   Knee ext 10# x 4 minutes   Ham curls 40# x 4 minutes   Leg Press 3 plates x 4 minutes   Prolonged Extension Stretch    Goal Assessment See Below    Modalities  Date:  Vaso: Knee, 34 degrees; low pressure, 15 mins, Pain and  Edema  PATIENT EDUCATION:  Education details: See below. Person educated: Patient and Spouse Education method: Explanation, Demonstration, and Handouts Education comprehension: verbalized understanding, returned demonstration, and needs further education  HOME EXERCISE PROGRAM: HOME EXERCISE PROGRAM [[id number]]  PRONE KNEE HANGS -  Repeat 1 Repetition, Hold 10 Minutes, Complete 1 Set, Perform 3 Times a Day  Exercises - Seated Knee Flexion AAROM  - 1 x daily - 7 x weekly - 1 sets - 10 reps - 5 hold - Seated Hamstring Curls with Resistance  - 1 x daily - 7 x weekly - 2 sets - 10 reps - 3 sec hold - Seated Hamstring Stretch  - 1 x daily - 7 x weekly - 2 sets - 30 sec hold - Seated Active Straight-Leg Raise  - 1 x daily - 7 x weekly - 2 sets - 10 reps - Hip Abduction with Resistance Loop  - 1 x daily - 7 x weekly - 2 sets - 10 reps - 3 sec hold - Hip Extension with Resistance Loop  - 1 x daily - 7 x weekly - 2 sets - 10 reps - 3 sec hold  ASSESSMENT:  CLINICAL IMPRESSION: Patient arrives for today's treatment session denying any pain.  Pt able to tolerate increased time with all cybex exercises today without discomfort or fatigue.  Pt has follow-up MD appointment today.  Pt able to demonstrate 4 to 125 degrees of right knee flexion post stretching.  Normal responses to vaso noted upon removal.  Pt denied any pain at completion of today's treatment session.   OBJECTIVE IMPAIRMENTS: Abnormal gait, decreased activity tolerance, decreased balance, decreased endurance, decreased mobility, difficulty walking, decreased ROM, decreased strength, hypomobility, increased edema, increased fascial restrictions, increased muscle spasms, improper body mechanics, postural dysfunction, and pain.   ACTIVITY LIMITATIONS: carrying, lifting, bending, sitting, standing, squatting, sleeping, stairs, transfers, bed mobility, bathing, toileting, hygiene/grooming, and locomotion level  PARTICIPATION LIMITATIONS: meal  prep, cleaning, driving, shopping, community activity, occupation, and yard work  PERSONAL FACTORS: Age, Fitness, Past/current experiences, and Time since onset of injury/illness/exacerbation are also affecting patient's functional outcome.   REHAB POTENTIAL: Good  CLINICAL DECISION MAKING: Evolving/moderate complexity  EVALUATION COMPLEXITY: Moderate   GOALS: Goals reviewed with patient? Yes  SHORT TERM GOALS: Target date: 11/03/2023  Patient will be independent with initial HEP. Baseline: From HHPT Goal status: MET   LONG TERM GOALS: Target date: 12/01/2023   Patient will be independent with advanced/ongoing HEP to improve outcomes and carryover.  Baseline: Not yet provided Goal status: IN PROGRESS  2.  Patient will report at least 75% improvement in R knee pain to improve QOL. Baseline: 1-2 Goal status: MET  3.  Patient will demonstrate improved R knee AROM to >/= 5-120 deg to allow for normal gait and stair mechanics. Baseline: 20-95 AROM       118 degrees; 10/29: -4-123 degrees Goal status: MET  4.  Patient will demonstrate improved functional LE strength as demonstrated by 5 x STS of </= 10 sec. Baseline: 13; 10/29: 12.2 seconds Goal status: IN PROGRESS  5.  Patient will be able  to ambulate 600' with LRAD and normal gait pattern without increased pain to access community.  Baseline: 100' with SPC Goal status: MET  6. Patient will be able to ascend/descend stairs without HR using reciprocal step pattern safely to access home and community.  Baseline: Able to ascend/descend 4 steps with 1 HR and cane reciprocally; 10/29: requires 1 HR Goal status: IN PROGRESS  7.  Patient will report 48 on LEFS (patient reported outcome measure) to demonstrate improved functional ability. Baseline: 36/80; 10/29: 62/80 Goal status: MET      PLAN:  PT FREQUENCY: 2x/week  PT DURATION: 8 weeks  PLANNED INTERVENTIONS: 97164- PT Re-evaluation, 97750- Physical Performance  Testing, 97110-Therapeutic exercises, 97530- Therapeutic activity, [id number]- Neuromuscular re-education, 97535- Self Care, 02859- Manual therapy, [id number]- Gait training, [phone]- Electrical stimulation (unattended), 97016- Vasopneumatic device, [id number]- Ionotophoresis 4mg /ml Dexamethasone , 79439 (1-2 muscles), 20561 (3+ muscles)- Dry Needling, Patient/Family education, Balance training, Taping, Joint mobilization, Spinal mobilization, Cryotherapy, and Moist heat  PLAN FOR NEXT SESSION: Review HEP for form. Work on knee ROM and strength. Standing terminal knee ext. Gait training with cane indoor/outdoor/stairs   Delon DELENA Gosling, PTA, 11/08/2023, 10:01 AM

## 2023-11-10 ENCOUNTER — Ambulatory Visit: Admitting: Physical Therapy

## 2023-11-10 DIAGNOSIS — R2689 Other abnormalities of gait and mobility: Secondary | ICD-10-CM

## 2023-11-10 DIAGNOSIS — M25661 Stiffness of right knee, not elsewhere classified: Secondary | ICD-10-CM | POA: Diagnosis not present

## 2023-11-10 DIAGNOSIS — M6281 Muscle weakness (generalized): Secondary | ICD-10-CM

## 2023-11-10 DIAGNOSIS — M25561 Pain in right knee: Secondary | ICD-10-CM

## 2023-11-10 NOTE — Therapy (Signed)
 OUTPATIENT PHYSICAL THERAPY TREATMENT   Patient Name: Fernando Pratt MRN: [id number] DOB:28-Apr-1945, 78 y.o., male Today's Date: 11/10/2023  END OF SESSION:  PT End of Session - 11/10/23 0901     Visit Number 11    Number of Visits 16    Date for Recertification  12/01/23    PT Start Time 0847    PT Stop Time 0945    PT Time Calculation (min) 58 min    Activity Tolerance Patient tolerated treatment well    Behavior During Therapy [facility] for tasks assessed/performed             Past Medical History:  Diagnosis Date   Arthritis    Complication of anesthesia    slow to wake up after neck surgery   Hyperlipidemia    Past Surgical History:  Procedure Laterality Date   COLONOSCOPY     NECK SURGERY  01/03/1989   TOTAL KNEE ARTHROPLASTY Right 09/22/2023   Procedure: ARTHROPLASTY, KNEE, TOTAL;  Surgeon: Vernetta Lonni GRADE, MD;  Location: [facility];  Service: Orthopedics;  Laterality: Right;   Patient Active Problem List   Diagnosis Date Noted   Status post total right knee replacement 09/22/2023   Unilateral primary osteoarthritis, right knee 09/21/2023   Spondylosis, cervical, with myelopathy 09/22/2015   Pain in joint of left shoulder 09/22/2015   Hyperlipidemia 09/22/2015   Neuropathy 09/22/2015   Vitamin D  deficiency 09/22/2015    PCP: Zollie Lowers, MD  REFERRING PROVIDER: Vernetta Lonni GRADE, MD  REFERRING DIAG: M17.11 (ICD-10-CM) - Unilateral primary osteoarthritis, right knee Z96.651 (ICD-10-CM) - Status post total right knee replacement  THERAPY DIAG:  Stiffness of right knee, not elsewhere classified  Acute pain of right knee  Muscle weakness (generalized)  Other abnormalities of gait and mobility  Rationale for Evaluation and Treatment: Rehabilitation  ONSET DATE: 09/22/23 surgery date  SUBJECTIVE:   SUBJECTIVE STATEMENT: Pt denies any pain today.   PERTINENT HISTORY: History of neck surgery, R achilles injury  PAIN:  Are you having  pain? No  PRECAUTIONS: None  RED FLAGS: None   WEIGHT BEARING RESTRICTIONS: No  FALLS:  Has patient fallen in last 6 months? 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  DIAGNOSTIC FINDINGS: n/a   PATIENT SURVEYS:  LEFS  Extreme difficulty/unable (0), Quite a bit of difficulty (1), Moderate difficulty (2), Little difficulty (3), No difficulty (4) Survey date:  10/06/23  Any of your usual work, housework or school activities 3  2. Usual hobbies, recreational or sporting activities 2  3. Getting into/out of the bath 4  4. Walking between rooms 4  5. Putting on socks/shoes 2  6. Squatting  1  7. Lifting an object, like a bag of groceries from the floor 2  8. Performing light activities around your home 2  9. Performing heavy activities around your home 2  10. Getting into/out of a car 2  11. Walking 2 blocks 2  12. Walking 1 mile 1  13. Going up/down 10 stairs (1 flight) 3  14. Standing for 1 hour 2  15.  sitting for 1 hour 2  16. Running on even ground 0  17. Running on uneven ground 0  18. Making sharp turns while running fast 0  19. Hopping  0  20. Rolling over in bed 2  Score total:  36/80     EDEMA:  Mild edema  MUSCLE LENGTH: Did not assess -- see knee ROM  POSTURE: flexed trunk   PALPATION: No unexpected tenderness to palpation  LOWER EXTREMITY ROM:  Active/Passive ROM Right eval Left eval  Hip flexion    Hip extension    Hip abduction    Hip adduction    Hip internal rotation    Hip external  rotation    Knee flexion 95/102 127/127  Knee extension -20/-9 0/0  Ankle dorsiflexion    Ankle plantarflexion    Ankle inversion    Ankle eversion     (Blank rows = not tested)  LOWER EXTREMITY MMT:  MMT Right eval Left eval  Hip flexion 4+ 5  Hip extension 4- 4  Hip abduction 3+ 4  Hip adduction    Hip internal rotation    Hip external rotation    Knee flexion 4+ 5  Knee extension 4+ 5  Ankle dorsiflexion    Ankle plantarflexion    Ankle inversion    Ankle eversion     (Blank rows = not tested)  LOWER EXTREMITY SPECIAL TESTS:  Did not assess  FUNCTIONAL TESTS:  5 times sit to stand: 13.78 sec with increased weight shift to L LE but no UE support SLS: L 43.15 sec, R 4.16 sec  GAIT: Distance walked: ~100' Assistive device utilized: tri tip cane Level of assistance: SBA Comments: R LE slightly externally rotated, R knee slightly flexed throughout gait, cane on L, diminished heel strike and hip ext during stance                                                                                                                                TREATMENT DATE:   11/10/23:                                       EXERCISE LOG  Exercise Repetitions and Resistance Comments  Recumbent 16 minutes progressing to seat 5.   Knee ext 10# x 3 minutes   Ham curls 40# x 3 minutes   Leg press 3 plates x 3 minutes    Knee extension stretch  Zero knee with 5# overpressure x 10 minutes.   3 minutes right hamstring stretch f/b Vaso on medium x 10 minutes.    11/08/23:                                   EXERCISE LOG  Exercise Repetitions and Resistance Comments  Recumbent bike Lvl 3 x 16 minutes; seat 5   Knee ext 10# x 4 minutes   Ham curls 40# x 4 minutes   Leg press 3 plates x 4 minutes   Lunges 14 box x 4 mins   Prolonged Extension Stretch Zero Knee x 4 mins   Flexion with Over pressure 4  mins    Modalities  Date:  Vaso: Knee, 34 degrees; medium pressure, 15 mins, Pain  and Edema  11/01/23:                          EXERCISE LOG  Exercise Repetitions and Resistance Comments  Recumbent bike Lvl 3 x 16 mins; seat 7-5   Knee ext 10# x 4 minutes   Ham curls 40# x 4 minutes   Leg Press 3 plates x 4 minutes   Prolonged Extension Stretch    Goal Assessment See Below    Modalities  Date:  Vaso: Knee, 34 degrees; low pressure, 15 mins, Pain and Edema  PATIENT EDUCATION:  Education details: See below. Person educated: Patient and Spouse Education method: Explanation, Demonstration, and Handouts Education comprehension: verbalized understanding, returned demonstration, and needs further education  HOME EXERCISE PROGRAM: HOME EXERCISE PROGRAM [[id number]]  PRONE KNEE HANGS -  Repeat 1 Repetition, Hold 10 Minutes, Complete 1 Set, Perform 3 Times a Day  Exercises - Seated Knee Flexion AAROM  - 1 x daily - 7 x weekly - 1 sets - 10 reps - 5 hold - Seated Hamstring Curls with Resistance  - 1 x daily - 7 x weekly - 2 sets - 10 reps - 3 sec hold - Seated Hamstring Stretch  - 1 x daily - 7 x weekly - 2 sets - 30 sec hold - Seated Active Straight-Leg Raise  - 1 x daily - 7 x weekly - 2 sets - 10 reps - Hip Abduction with Resistance Loop  - 1 x daily - 7 x weekly - 2 sets - 10 reps - 3 sec hold - Hip Extension with Resistance Loop  - 1 x daily - 7 x weekly - 2 sets - 10 reps - 3 sec hold  ASSESSMENT:  CLINICAL IMPRESSION: Patient did great today tolerating Zero Knee with 5# overpressure without complaint.    OBJECTIVE IMPAIRMENTS: Abnormal gait, decreased activity tolerance, decreased balance, decreased endurance, decreased mobility, difficulty walking, decreased ROM, decreased strength, hypomobility, increased edema, increased fascial restrictions, increased muscle spasms, improper body mechanics, postural dysfunction, and pain.   ACTIVITY LIMITATIONS: carrying, lifting, bending, sitting, standing, squatting, sleeping, stairs, transfers, bed mobility, bathing,  toileting, hygiene/grooming, and locomotion level  PARTICIPATION LIMITATIONS: meal prep, cleaning, driving, shopping, community activity, occupation, and yard work  PERSONAL FACTORS: Age, Fitness, Past/current experiences, and Time since onset of injury/illness/exacerbation are also affecting patient's functional outcome.   REHAB POTENTIAL: Good  CLINICAL DECISION MAKING: Evolving/moderate complexity  EVALUATION COMPLEXITY: Moderate   GOALS: Goals reviewed with patient? Yes  SHORT TERM GOALS: Target date: 11/03/2023  Patient will be independent with initial HEP. Baseline: From HHPT Goal status: MET   LONG TERM GOALS: Target date: 12/01/2023   Patient will be independent with advanced/ongoing HEP to improve outcomes and carryover.  Baseline: Not yet provided Goal status: IN PROGRESS  2.  Patient will report at least 75% improvement in R knee pain to improve QOL. Baseline: 1-2 Goal status: MET  3.  Patient will demonstrate improved R knee AROM to >/= 5-120 deg to allow  for normal gait and stair mechanics. Baseline: 20-95 AROM       118 degrees; 10/29: -4-123 degrees Goal status: MET  4.  Patient will demonstrate improved functional LE strength as demonstrated by 5 x STS of </= 10 sec. Baseline: 13; 10/29: 12.2 seconds Goal status: IN PROGRESS  5.  Patient will be able to ambulate 600' with LRAD and normal gait pattern without increased pain to access community.  Baseline: 100' with SPC Goal status: MET  6. Patient will be able to ascend/descend stairs without HR using reciprocal step pattern safely to access home and community.  Baseline: Able to ascend/descend 4 steps with 1 HR and cane reciprocally; 10/29: requires 1 HR Goal status: IN PROGRESS  7.  Patient will report 48 on LEFS (patient reported outcome measure) to demonstrate improved functional ability. Baseline: 36/80; 10/29: 62/80 Goal status: MET      PLAN:  PT FREQUENCY: 2x/week  PT DURATION: 8  weeks  PLANNED INTERVENTIONS: 97164- PT Re-evaluation, 97750- Physical Performance Testing, 97110-Therapeutic exercises, 97530- Therapeutic activity, [id number]- Neuromuscular re-education, 97535- Self Care, 02859- Manual therapy, [id number]- Gait training, [phone]- Electrical stimulation (unattended), 97016- Vasopneumatic device, [id number]- Ionotophoresis 4mg /ml Dexamethasone , 79439 (1-2 muscles), 20561 (3+ muscles)- Dry Needling, Patient/Family education, Balance training, Taping, Joint mobilization, Spinal mobilization, Cryotherapy, and Moist heat  PLAN FOR NEXT SESSION: Review HEP for form. Work on knee ROM and strength. Standing terminal knee ext. Gait training with cane indoor/outdoor/stairs   Garrette Caine, PT, 11/10/2023, 10:18 AM

## 2023-11-15 ENCOUNTER — Ambulatory Visit

## 2023-11-15 DIAGNOSIS — M25661 Stiffness of right knee, not elsewhere classified: Secondary | ICD-10-CM | POA: Diagnosis not present

## 2023-11-15 DIAGNOSIS — M6281 Muscle weakness (generalized): Secondary | ICD-10-CM

## 2023-11-15 DIAGNOSIS — R2689 Other abnormalities of gait and mobility: Secondary | ICD-10-CM

## 2023-11-15 DIAGNOSIS — M25561 Pain in right knee: Secondary | ICD-10-CM

## 2023-11-15 NOTE — Therapy (Signed)
 OUTPATIENT PHYSICAL THERAPY TREATMENT   Patient Name: Fernando Pratt MRN: [id number] DOB:January 12, 1945, 78 y.o., male Today's Date: 11/15/2023  END OF SESSION:  PT End of Session - 11/15/23 0850     Visit Number 12    Number of Visits 16    Date for Recertification  12/01/23    PT Start Time 0845    PT Stop Time 0946    PT Time Calculation (min) 61 min    Activity Tolerance Patient tolerated treatment well    Behavior During Therapy [facility] for tasks assessed/performed             Past Medical History:  Diagnosis Date   Arthritis    Complication of anesthesia    slow to wake up after neck surgery   Hyperlipidemia    Past Surgical History:  Procedure Laterality Date   COLONOSCOPY     NECK SURGERY  01/03/1989   TOTAL KNEE ARTHROPLASTY Right 09/22/2023   Procedure: ARTHROPLASTY, KNEE, TOTAL;  Surgeon: Vernetta Lonni GRADE, MD;  Location: [facility];  Service: Orthopedics;  Laterality: Right;   Patient Active Problem List   Diagnosis Date Noted   Status post total right knee replacement 09/22/2023   Unilateral primary osteoarthritis, right knee 09/21/2023   Spondylosis, cervical, with myelopathy 09/22/2015   Pain in joint of left shoulder 09/22/2015   Hyperlipidemia 09/22/2015   Neuropathy 09/22/2015   Vitamin D  deficiency 09/22/2015    PCP: Zollie Lowers, MD  REFERRING PROVIDER: Vernetta Lonni GRADE, MD  REFERRING DIAG: M17.11 (ICD-10-CM) - Unilateral primary osteoarthritis, right knee Z96.651 (ICD-10-CM) - Status post total right knee replacement  THERAPY DIAG:  Stiffness of right knee, not elsewhere classified  Acute pain of right knee  Muscle weakness (generalized)  Other abnormalities of gait and mobility  Rationale for Evaluation and Treatment: Rehabilitation  ONSET DATE: 09/22/23 surgery date  SUBJECTIVE:   SUBJECTIVE STATEMENT: Pt denies any pain today.   PERTINENT HISTORY: History of neck surgery, R achilles injury  PAIN:  Are you  having pain? No  PRECAUTIONS: None  RED FLAGS: None   WEIGHT BEARING RESTRICTIONS: No  FALLS:  Has patient fallen in last 6 months? 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  DIAGNOSTIC FINDINGS: n/a   PATIENT SURVEYS:  LEFS  Extreme difficulty/unable (0), Quite a bit of difficulty (1), Moderate difficulty (2), Little difficulty (3), No difficulty (4) Survey date:  10/06/23  Any of your usual work, housework or school activities 3  2. Usual hobbies, recreational or sporting activities 2  3. Getting into/out of the bath 4  4. Walking between rooms 4  5. Putting on socks/shoes 2  6. Squatting  1  7. Lifting an object, like a bag of groceries from the floor 2  8. Performing light activities around your home 2  9. Performing heavy activities around your home 2  10. Getting into/out of a car 2  11. Walking 2 blocks 2  12. Walking 1 mile 1  13. Going up/down 10 stairs (1 flight) 3  14. Standing for 1 hour 2  15.  sitting for 1 hour 2  16. Running on even ground 0  17. Running on uneven ground 0  18. Making sharp turns while running fast 0  19. Hopping  0  20. Rolling over in bed 2  Score total:  36/80     EDEMA:  Mild edema  MUSCLE LENGTH: Did not assess -- see knee ROM  POSTURE: flexed trunk   PALPATION: No unexpected tenderness to palpation  LOWER EXTREMITY ROM:  Active/Passive ROM Right eval Left eval  Hip flexion    Hip extension    Hip abduction    Hip adduction    Hip internal rotation    Hip  external rotation    Knee flexion 95/102 127/127  Knee extension -20/-9 0/0  Ankle dorsiflexion    Ankle plantarflexion    Ankle inversion    Ankle eversion     (Blank rows = not tested)  LOWER EXTREMITY MMT:  MMT Right eval Left eval  Hip flexion 4+ 5  Hip extension 4- 4  Hip abduction 3+ 4  Hip adduction    Hip internal rotation    Hip external rotation    Knee flexion 4+ 5  Knee extension 4+ 5  Ankle dorsiflexion    Ankle plantarflexion    Ankle inversion    Ankle eversion     (Blank rows = not tested)  LOWER EXTREMITY SPECIAL TESTS:  Did not assess  FUNCTIONAL TESTS:  5 times sit to stand: 13.78 sec with increased weight shift to L LE but no UE support SLS: L 43.15 sec, R 4.16 sec  GAIT: Distance walked: ~100' Assistive device utilized: tri tip cane Level of assistance: SBA Comments: R LE slightly externally rotated, R knee slightly flexed throughout gait, cane on L, diminished heel strike and hip ext during stance                                                                                                                                TREATMENT DATE:   11/15/23:                                      EXERCISE LOG  Exercise Repetitions and Resistance Comments  Recumbent 16 minutes progressing to seat 5.   Knee ext 10# x 4.5 minutes   Ham curls 40# x 4.5 minutes   Leg press 3 plates x 4 minutes    Knee extension stretch  Zero knee with 5# overpressure x 5 minutes.   Lunges  14 box x 5 mins     11/08/23:                                   EXERCISE LOG  Exercise Repetitions and Resistance Comments  Recumbent bike Lvl 3 x  16 minutes; seat 5   Knee ext 10# x 4 minutes   Ham curls 40# x 4 minutes   Leg press 3 plates x 4 minutes   Lunges 14 box x 4 mins   Prolonged Extension Stretch Zero Knee x 4 mins   Flexion with Over pressure 4  mins    Modalities  Date:  Vaso: Knee, 34 degrees; medium pressure, 15 mins, Pain and Edema  11/01/23:                           EXERCISE LOG  Exercise Repetitions and Resistance Comments  Recumbent bike Lvl 3 x 16 mins; seat 7-5   Knee ext 10# x 4 minutes   Ham curls 40# x 4 minutes   Leg Press 3 plates x 4 minutes   Prolonged Extension Stretch    Goal Assessment See Below    Modalities  Date:  Vaso: Knee, 34 degrees; low pressure, 15 mins, Pain and Edema  PATIENT EDUCATION:  Education details: See below. Person educated: Patient and Spouse Education method: Explanation, Demonstration, and Handouts Education comprehension: verbalized understanding, returned demonstration, and needs further education  HOME EXERCISE PROGRAM: HOME EXERCISE PROGRAM [[id number]]  PRONE KNEE HANGS -  Repeat 1 Repetition, Hold 10 Minutes, Complete 1 Set, Perform 3 Times a Day  Exercises - Seated Knee Flexion AAROM  - 1 x daily - 7 x weekly - 1 sets - 10 reps - 5 hold - Seated Hamstring Curls with Resistance  - 1 x daily - 7 x weekly - 2 sets - 10 reps - 3 sec hold - Seated Hamstring Stretch  - 1 x daily - 7 x weekly - 2 sets - 30 sec hold - Seated Active Straight-Leg Raise  - 1 x daily - 7 x weekly - 2 sets - 10 reps - Hip Abduction with Resistance Loop  - 1 x daily - 7 x weekly - 2 sets - 10 reps - 3 sec hold - Hip Extension with Resistance Loop  - 1 x daily - 7 x weekly - 2 sets - 10 reps - 3 sec hold  ASSESSMENT:  CLINICAL IMPRESSION: Patient arrives for today's treatment session denying any pain, but reports that he has a cold.  Pt reports that doctor is please with his progress and would like for him to finish out his therapy.  Pt able to tolerate increased time on all cybex exercises today.  Pt would like to continue to work on increasing right knee extension.  Normal responses to vaso noted upon removal.  Pt denied any pain at completion of today's treatment session.   OBJECTIVE IMPAIRMENTS: Abnormal gait, decreased activity tolerance, decreased balance, decreased endurance, decreased mobility, difficulty  walking, decreased ROM, decreased strength, hypomobility, increased edema, increased fascial restrictions, increased muscle spasms, improper body mechanics, postural dysfunction, and pain.   ACTIVITY LIMITATIONS: carrying, lifting, bending, sitting, standing, squatting, sleeping, stairs, transfers, bed mobility, bathing, toileting, hygiene/grooming, and locomotion level  PARTICIPATION LIMITATIONS: meal prep, cleaning, driving, shopping, community activity, occupation, and yard work  PERSONAL FACTORS: Age, Fitness, Past/current experiences, and Time since onset of injury/illness/exacerbation are also affecting patient's functional outcome.   REHAB POTENTIAL: Good  CLINICAL DECISION MAKING: Evolving/moderate complexity  EVALUATION COMPLEXITY: Moderate   GOALS: Goals reviewed with patient? Yes  SHORT TERM GOALS: Target date: 11/03/2023  Patient will be independent with initial HEP. Baseline: From HHPT Goal status: MET   LONG TERM GOALS: Target date:  12/01/2023   Patient will be independent with advanced/ongoing HEP to improve outcomes and carryover.  Baseline: Not yet provided Goal status: IN PROGRESS  2.  Patient will report at least 75% improvement in R knee pain to improve QOL. Baseline: 1-2 Goal status: MET  3.  Patient will demonstrate improved R knee AROM to >/= 5-120 deg to allow for normal gait and stair mechanics. Baseline: 20-95 AROM       118 degrees; 10/29: -4-123 degrees Goal status: MET  4.  Patient will demonstrate improved functional LE strength as demonstrated by 5 x STS of </= 10 sec. Baseline: 13; 10/29: 12.2 seconds Goal status: IN PROGRESS  5.  Patient will be able to ambulate 600' with LRAD and normal gait pattern without increased pain to access community.  Baseline: 100' with SPC Goal status: MET  6. Patient will be able to ascend/descend stairs without HR using reciprocal step pattern safely to access home and community.  Baseline: Able to  ascend/descend 4 steps with 1 HR and cane reciprocally; 10/29: requires 1 HR Goal status: IN PROGRESS  7.  Patient will report 48 on LEFS (patient reported outcome measure) to demonstrate improved functional ability. Baseline: 36/80; 10/29: 62/80 Goal status: MET  PLAN:  PT FREQUENCY: 2x/week  PT DURATION: 8 weeks  PLANNED INTERVENTIONS: 97164- PT Re-evaluation, 97750- Physical Performance Testing, 97110-Therapeutic exercises, 97530- Therapeutic activity, 97112- Neuromuscular re-education, 97535- Self Care, 02859- Manual therapy, [id number]- Gait training, [phone]- Electrical stimulation (unattended), 97016- Vasopneumatic device, [id number]- Ionotophoresis 4mg /ml Dexamethasone , 79439 (1-2 muscles), 20561 (3+ muscles)- Dry Needling, Patient/Family education, Balance training, Taping, Joint mobilization, Spinal mobilization, Cryotherapy, and Moist heat  PLAN FOR NEXT SESSION: Review HEP for form. Work on knee ROM and strength. Standing terminal knee ext. Gait training with cane indoor/outdoor/stairs   Delon DELENA Gosling, PTA, 11/15/2023, 10:57 AM

## 2023-11-17 ENCOUNTER — Encounter: Payer: Self-pay | Admitting: Physical Therapy

## 2023-11-17 ENCOUNTER — Ambulatory Visit: Admitting: Physical Therapy

## 2023-11-17 DIAGNOSIS — M25661 Stiffness of right knee, not elsewhere classified: Secondary | ICD-10-CM

## 2023-11-17 DIAGNOSIS — M6281 Muscle weakness (generalized): Secondary | ICD-10-CM

## 2023-11-17 DIAGNOSIS — R2689 Other abnormalities of gait and mobility: Secondary | ICD-10-CM

## 2023-11-17 DIAGNOSIS — M25561 Pain in right knee: Secondary | ICD-10-CM

## 2023-11-17 NOTE — Therapy (Signed)
 OUTPATIENT PHYSICAL THERAPY TREATMENT   Patient Name: TONYA CARLILE MRN: [id number] DOB:[birth year], 78 y.o., male Today's Date: 11/17/2023  END OF SESSION:  PT End of Session - 11/17/23 1101     Visit Number 13    Number of Visits 16    Date for Recertification  12/01/23    PT Start Time 0845    PT Stop Time 0948    PT Time Calculation (min) 63 min    Activity Tolerance Patient tolerated treatment well    Behavior During Therapy [facility] for tasks assessed/performed              Past Medical History:  Diagnosis Date   Arthritis    Complication of anesthesia    slow to wake up after neck surgery   Hyperlipidemia    Past Surgical History:  Procedure Laterality Date   COLONOSCOPY     NECK SURGERY  01/03/1989   TOTAL KNEE ARTHROPLASTY Right 09/22/2023   Procedure: ARTHROPLASTY, KNEE, TOTAL;  Surgeon: Vernetta Lonni GRADE, MD;  Location: [facility];  Service: Orthopedics;  Laterality: Right;   Patient Active Problem List   Diagnosis Date Noted   Status post total right knee replacement 09/22/2023   Unilateral primary osteoarthritis, right knee 09/21/2023   Spondylosis, cervical, with myelopathy 09/22/2015   Pain in joint of left shoulder 09/22/2015   Hyperlipidemia 09/22/2015   Neuropathy 09/22/2015   Vitamin D  deficiency 09/22/2015    PCP: Zollie Lowers, MD  REFERRING PROVIDER: Vernetta Lonni GRADE, MD  REFERRING DIAG: M17.11 (ICD-10-CM) - Unilateral primary osteoarthritis, right knee Z96.651 (ICD-10-CM) - Status post total right knee replacement  THERAPY DIAG:  Stiffness of right knee, not elsewhere classified  Acute pain of right knee  Muscle weakness (generalized)  Other abnormalities of gait and mobility  Rationale for Evaluation and Treatment: Rehabilitation  ONSET DATE: 09/22/23 surgery date  SUBJECTIVE:   SUBJECTIVE STATEMENT: Pt denies any pain today.   PERTINENT HISTORY: History of neck surgery, R achilles injury  PAIN:  Are you  having pain? No  PRECAUTIONS: None  RED FLAGS: None   WEIGHT BEARING RESTRICTIONS: No  FALLS:  Has patient fallen in last 6 months? 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  DIAGNOSTIC FINDINGS: n/a   PATIENT SURVEYS:  LEFS  Extreme difficulty/unable (0), Quite a bit of difficulty (1), Moderate difficulty (2), Little difficulty (3), No difficulty (4) Survey date:  10/06/23  Any of your usual work, housework or school activities 3  2. Usual hobbies, recreational or sporting activities 2  3. Getting into/out of the bath 4  4. Walking between rooms 4  5. Putting on socks/shoes 2  6. Squatting  1  7. Lifting an object, like a bag of groceries from the floor 2  8. Performing light activities around your home 2  9. Performing heavy activities around your home 2  10. Getting into/out of a car 2  11. Walking 2 blocks 2  12. Walking 1 mile 1  13. Going up/down 10 stairs (1 flight) 3  14. Standing for 1 hour 2  15.  sitting for 1 hour 2  16. Running on even ground 0  17. Running on uneven ground 0  18. Making sharp turns while running fast 0  19. Hopping  0  20. Rolling over in bed 2  Score total:  36/80     EDEMA:  Mild edema  MUSCLE LENGTH: Did not assess -- see knee ROM  POSTURE: flexed trunk   PALPATION: No unexpected tenderness to palpation  LOWER EXTREMITY ROM:  Active/Passive ROM Right eval Left eval  Hip flexion    Hip extension    Hip abduction    Hip adduction    Hip internal rotation    Hip  external rotation    Knee flexion 95/102 127/127  Knee extension -20/-9 0/0  Ankle dorsiflexion    Ankle plantarflexion    Ankle inversion    Ankle eversion     (Blank rows = not tested)  LOWER EXTREMITY MMT:  MMT Right eval Left eval  Hip flexion 4+ 5  Hip extension 4- 4  Hip abduction 3+ 4  Hip adduction    Hip internal rotation    Hip external rotation    Knee flexion 4+ 5  Knee extension 4+ 5  Ankle dorsiflexion    Ankle plantarflexion    Ankle inversion    Ankle eversion     (Blank rows = not tested)  LOWER EXTREMITY SPECIAL TESTS:  Did not assess  FUNCTIONAL TESTS:  5 times sit to stand: 13.78 sec with increased weight shift to L LE but no UE support SLS: L 43.15 sec, R 4.16 sec  GAIT: Distance walked: ~100' Assistive device utilized: tri tip cane Level of assistance: SBA Comments: R LE slightly externally rotated, R knee slightly flexed throughout gait, cane on L, diminished heel strike and hip ext during stance                                                                                                                                TREATMENT DATE:   11/17/23:                                     EXERCISE LOG  Exercise Repetitions and Resistance Comments  Recumbent bike 15 minutes   Knee ext 10# x 4 minutes    Ham curls 40# x 2 minutes, 50# x 2 minutes    Leg Press 3 plates x 4 minutes   In supine:  1-1 stretching into extension x 11 minutes, overpressure and hamstring (SLR).  Vasopneumatic on medium x 15 minutes.    11/15/23:                                      EXERCISE LOG  Exercise Repetitions and Resistance Comments  Recumbent 16  minutes progressing to seat 5.   Knee ext 10# x 4.5 minutes   Ham curls 40# x 4.5 minutes   Leg press 3 plates x 4 minutes    Knee extension stretch  Zero knee with 5# overpressure x 5 minutes.   Lunges  14 box x 5 mins     11/08/23:                                   EXERCISE LOG  Exercise Repetitions  and Resistance Comments  Recumbent bike Lvl 3 x 16 minutes; seat 5   Knee ext 10# x 4 minutes   Ham curls 40# x 4 minutes   Leg press 3 plates x 4 minutes   Lunges 14 box x 4 mins   Prolonged Extension Stretch Zero Knee x 4 mins   Flexion with Over pressure 4  mins    Modalities  Date:  Vaso: Knee, 34 degrees; medium pressure, 15 mins, Pain and Edema  11/01/23:                          EXERCISE LOG  Exercise Repetitions and Resistance Comments  Recumbent bike Lvl 3 x 16 mins; seat 7-5   Knee ext 10# x 4 minutes   Ham curls 40# x 4 minutes   Leg Press 3 plates x 4 minutes   Prolonged Extension Stretch    Goal Assessment See Below    Modalities  Date:  Vaso: Knee, 34 degrees; low pressure, 15 mins, Pain and Edema  PATIENT EDUCATION:  Education details: See below. Person educated: Patient and Spouse Education method: Explanation, Demonstration, and Handouts Education comprehension: verbalized understanding, returned demonstration, and needs further education  HOME EXERCISE PROGRAM: HOME EXERCISE PROGRAM [[id number]]  PRONE KNEE HANGS -  Repeat 1 Repetition, Hold 10 Minutes, Complete 1 Set, Perform 3 Times a Day  Exercises - Seated Knee Flexion AAROM  - 1 x daily - 7 x weekly - 1 sets - 10 reps - 5 hold - Seated Hamstring Curls with Resistance  - 1 x daily - 7 x weekly - 2 sets - 10 reps - 3 sec hold - Seated Hamstring Stretch  - 1 x daily - 7 x weekly - 2 sets - 30 sec hold - Seated Active Straight-Leg Raise  - 1 x daily - 7 x weekly - 2 sets - 10 reps - Hip Abduction with Resistance Loop  - 1 x daily - 7 x weekly - 2 sets - 10 reps - 3 sec hold - Hip Extension with Resistance Loop  - 1 x daily - 7 x weekly - 2 sets - 10 reps - 3 sec hold  ASSESSMENT:  CLINICAL IMPRESSION: Patient did great today and was able to increase to 50# for ham curls today without compliant.  Close to achieving full passive extension today.    OBJECTIVE IMPAIRMENTS: Abnormal gait, decreased  activity tolerance, decreased balance, decreased endurance, decreased mobility, difficulty walking, decreased ROM, decreased strength, hypomobility, increased edema, increased fascial restrictions, increased muscle spasms, improper body mechanics, postural dysfunction, and pain.   ACTIVITY LIMITATIONS: carrying, lifting, bending, sitting, standing, squatting, sleeping, stairs, transfers, bed mobility, bathing, toileting, hygiene/grooming, and locomotion level  PARTICIPATION LIMITATIONS: meal prep, cleaning, driving, shopping, community activity, occupation, and yard work  PERSONAL FACTORS: Age, Fitness, Past/current experiences, and Time since onset of injury/illness/exacerbation are also affecting patient's  functional outcome.   REHAB POTENTIAL: Good  CLINICAL DECISION MAKING: Evolving/moderate complexity  EVALUATION COMPLEXITY: Moderate   GOALS: Goals reviewed with patient? Yes  SHORT TERM GOALS: Target date: 11/03/2023  Patient will be independent with initial HEP. Baseline: From HHPT Goal status: MET   LONG TERM GOALS: Target date: 12/01/2023   Patient will be independent with advanced/ongoing HEP to improve outcomes and carryover.  Baseline: Not yet provided Goal status: IN PROGRESS  2.  Patient will report at least 75% improvement in R knee pain to improve QOL. Baseline: 1-2 Goal status: MET  3.  Patient will demonstrate improved R knee AROM to >/= 5-120 deg to allow for normal gait and stair mechanics. Baseline: 20-95 AROM       118 degrees; 10/29: -4-123 degrees Goal status: MET  4.  Patient will demonstrate improved functional LE strength as demonstrated by 5 x STS of </= 10 sec. Baseline: 13; 10/29: 12.2 seconds Goal status: IN PROGRESS  5.  Patient will be able to ambulate 600' with LRAD and normal gait pattern without increased pain to access community.  Baseline: 100' with SPC Goal status: MET  6. Patient will be able to ascend/descend stairs without HR  using reciprocal step pattern safely to access home and community.  Baseline: Able to ascend/descend 4 steps with 1 HR and cane reciprocally; 10/29: requires 1 HR Goal status: IN PROGRESS  7.  Patient will report 48 on LEFS (patient reported outcome measure) to demonstrate improved functional ability. Baseline: 36/80; 10/29: 62/80 Goal status: MET  PLAN:  PT FREQUENCY: 2x/week  PT DURATION: 8 weeks  PLANNED INTERVENTIONS: 97164- PT Re-evaluation, 97750- Physical Performance Testing, 97110-Therapeutic exercises, 97530- Therapeutic activity, [id number]- Neuromuscular re-education, 97535- Self Care, 02859- Manual therapy, [id number]- Gait training, [phone]- Electrical stimulation (unattended), 97016- Vasopneumatic device, [id number]- Ionotophoresis 4mg /ml Dexamethasone , 79439 (1-2 muscles), 20561 (3+ muscles)- Dry Needling, Patient/Family education, Balance training, Taping, Joint mobilization, Spinal mobilization, Cryotherapy, and Moist heat  PLAN FOR NEXT SESSION: Review HEP for form. Work on knee ROM and strength. Standing terminal knee ext. Gait training with cane indoor/outdoor/stairs   Shashana Fullington, PT, 11/17/2023, 12:32 PM

## 2023-11-22 ENCOUNTER — Ambulatory Visit

## 2023-11-22 DIAGNOSIS — M25561 Pain in right knee: Secondary | ICD-10-CM

## 2023-11-22 DIAGNOSIS — R2689 Other abnormalities of gait and mobility: Secondary | ICD-10-CM

## 2023-11-22 DIAGNOSIS — M25661 Stiffness of right knee, not elsewhere classified: Secondary | ICD-10-CM

## 2023-11-22 DIAGNOSIS — M6281 Muscle weakness (generalized): Secondary | ICD-10-CM

## 2023-11-22 NOTE — Therapy (Signed)
 OUTPATIENT PHYSICAL THERAPY TREATMENT   Patient Name: Fernando Pratt MRN: [id number] DOB:Sep 24, 1945, 78 y.o., male Today's Date: 11/22/2023  END OF SESSION:  PT End of Session - 11/22/23 0851     Visit Number 14    Number of Visits 16    Date for Recertification  12/01/23    PT Start Time 0851    PT Stop Time 0954    PT Time Calculation (min) 63 min    Activity Tolerance Patient tolerated treatment well    Behavior During Therapy [facility] for tasks assessed/performed              Past Medical History:  Diagnosis Date   Arthritis    Complication of anesthesia    slow to wake up after neck surgery   Hyperlipidemia    Past Surgical History:  Procedure Laterality Date   COLONOSCOPY     NECK SURGERY  01/03/1989   TOTAL KNEE ARTHROPLASTY Right 09/22/2023   Procedure: ARTHROPLASTY, KNEE, TOTAL;  Surgeon: Vernetta Lonni GRADE, MD;  Location: [facility];  Service: Orthopedics;  Laterality: Right;   Patient Active Problem List   Diagnosis Date Noted   Status post total right knee replacement 09/22/2023   Unilateral primary osteoarthritis, right knee 09/21/2023   Spondylosis, cervical, with myelopathy 09/22/2015   Pain in joint of left shoulder 09/22/2015   Hyperlipidemia 09/22/2015   Neuropathy 09/22/2015   Vitamin D  deficiency 09/22/2015    PCP: Zollie Lowers, MD  REFERRING PROVIDER: Vernetta Lonni GRADE, MD  REFERRING DIAG: M17.11 (ICD-10-CM) - Unilateral primary osteoarthritis, right knee Z96.651 (ICD-10-CM) - Status post total right knee replacement  THERAPY DIAG:  Stiffness of right knee, not elsewhere classified  Acute pain of right knee  Muscle weakness (generalized)  Other abnormalities of gait and mobility  Rationale for Evaluation and Treatment: Rehabilitation  ONSET DATE: 09/22/23 surgery date  SUBJECTIVE:   SUBJECTIVE STATEMENT: Pt denies any pain today.   PERTINENT HISTORY: History of neck surgery, R achilles injury  PAIN:  Are you  having pain? No  PRECAUTIONS: None  RED FLAGS: None   WEIGHT BEARING RESTRICTIONS: No  FALLS:  Has patient fallen in last 6 months? 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  DIAGNOSTIC FINDINGS: n/a   PATIENT SURVEYS:  LEFS  Extreme difficulty/unable (0), Quite a bit of difficulty (1), Moderate difficulty (2), Little difficulty (3), No difficulty (4) Survey date:  10/06/23  Any of your usual work, housework or school activities 3  2. Usual hobbies, recreational or sporting activities 2  3. Getting into/out of the bath 4  4. Walking between rooms 4  5. Putting on socks/shoes 2  6. Squatting  1  7. Lifting an object, like a bag of groceries from the floor 2  8. Performing light activities around your home 2  9. Performing heavy activities around your home 2  10. Getting into/out of a car 2  11. Walking 2 blocks 2  12. Walking 1 mile 1  13. Going up/down 10 stairs (1 flight) 3  14. Standing for 1 hour 2  15.  sitting for 1 hour 2  16. Running on even ground 0  17. Running on uneven ground 0  18. Making sharp turns while running fast 0  19. Hopping  0  20. Rolling over in bed 2  Score total:  36/80     EDEMA:  Mild edema  MUSCLE LENGTH: Did not assess -- see knee ROM  POSTURE: flexed trunk   PALPATION: No unexpected tenderness to palpation  LOWER EXTREMITY ROM:  Active/Passive ROM Right eval Left eval  Hip flexion    Hip extension    Hip abduction    Hip adduction    Hip internal rotation    Hip  external rotation    Knee flexion 95/102 127/127  Knee extension -20/-9 0/0  Ankle dorsiflexion    Ankle plantarflexion    Ankle inversion    Ankle eversion     (Blank rows = not tested)  LOWER EXTREMITY MMT:  MMT Right eval Left eval  Hip flexion 4+ 5  Hip extension 4- 4  Hip abduction 3+ 4  Hip adduction    Hip internal rotation    Hip external rotation    Knee flexion 4+ 5  Knee extension 4+ 5  Ankle dorsiflexion    Ankle plantarflexion    Ankle inversion    Ankle eversion     (Blank rows = not tested)  LOWER EXTREMITY SPECIAL TESTS:  Did not assess  FUNCTIONAL TESTS:  5 times sit to stand: 13.78 sec with increased weight shift to L LE but no UE support SLS: L 43.15 sec, R 4.16 sec  GAIT: Distance walked: ~100' Assistive device utilized: tri tip cane Level of assistance: SBA Comments: R LE slightly externally rotated, R knee slightly flexed throughout gait, cane on L, diminished heel strike and hip ext during stance                                                                                                                                TREATMENT DATE:   11/22/23:                                   EXERCISE LOG  Exercise Repetitions and Resistance Comments  Recumbent bike Lvl 4 x 15 minutes   Knee ext 20# x 4 mins   Ham curls 50# x 4 mins   Leg Press 3.5 plates; seat 8;  x 4 minutes   Knee Extension Stretch 5# x 11 mins    Vasopneumatic on medium x 15 minutes.    11/15/23:                                      EXERCISE LOG  Exercise Repetitions and Resistance Comments  Recumbent 16 minutes progressing to seat 5.  Knee ext 10# x 4.5 minutes   Ham curls 40# x 4.5 minutes   Leg press 3 plates x 4 minutes    Knee extension stretch  Zero knee with 5# overpressure x 5 minutes.   Lunges  14 box x 5 mins     11/08/23:                                   EXERCISE LOG  Exercise Repetitions and Resistance Comments  Recumbent bike Lvl 3 x 16 minutes;  seat 5   Knee ext 10# x 4 minutes   Ham curls 40# x 4 minutes   Leg press 3 plates x 4 minutes   Lunges 14 box x 4 mins   Prolonged Extension Stretch Zero Knee x 4 mins   Flexion with Over pressure 4  mins    Modalities  Date:  Vaso: Knee, 34 degrees; medium pressure, 15 mins, Pain and Edema  11/01/23:                          EXERCISE LOG  Exercise Repetitions and Resistance Comments  Recumbent bike Lvl 3 x 16 mins; seat 7-5   Knee ext 10# x 4 minutes   Ham curls 40# x 4 minutes   Leg Press 3 plates x 4 minutes   Prolonged Extension Stretch    Goal Assessment See Below    Modalities  Date:  Vaso: Knee, 34 degrees; low pressure, 15 mins, Pain and Edema  PATIENT EDUCATION:  Education details: See below. Person educated: Patient and Spouse Education method: Explanation, Demonstration, and Handouts Education comprehension: verbalized understanding, returned demonstration, and needs further education  HOME EXERCISE PROGRAM: HOME EXERCISE PROGRAM [[id number]]  PRONE KNEE HANGS -  Repeat 1 Repetition, Hold 10 Minutes, Complete 1 Set, Perform 3 Times a Day  Exercises - Seated Knee Flexion AAROM  - 1 x daily - 7 x weekly - 1 sets - 10 reps - 5 hold - Seated Hamstring Curls with Resistance  - 1 x daily - 7 x weekly - 2 sets - 10 reps - 3 sec hold - Seated Hamstring Stretch  - 1 x daily - 7 x weekly - 2 sets - 30 sec hold - Seated Active Straight-Leg Raise  - 1 x daily - 7 x weekly - 2 sets - 10 reps - Hip Abduction with Resistance Loop  - 1 x daily - 7 x weekly - 2 sets - 10 reps - 3 sec hold - Hip Extension with Resistance Loop  - 1 x daily - 7 x weekly - 2 sets - 10 reps - 3 sec hold  ASSESSMENT:  CLINICAL IMPRESSION: Pt arrives for today's treatment session denying any pain, but does report right knee tightness. Pt able to tolerate increased resistance with all cybex exercises today with fatigue noted, but no discomfort.  Pt continues to benefit from extended time extension  stretch on zero knee.  Normal responses to vaso noted upon removal.  Pt denied any pain at completion of today's treatment session.  OBJECTIVE IMPAIRMENTS: Abnormal gait, decreased activity tolerance, decreased balance, decreased endurance, decreased mobility, difficulty walking, decreased ROM, decreased strength, hypomobility, increased edema, increased fascial restrictions, increased muscle spasms, improper body mechanics, postural dysfunction, and pain.   ACTIVITY LIMITATIONS: carrying, lifting, bending, sitting, standing, squatting, sleeping, stairs, transfers, bed mobility, bathing, toileting, hygiene/grooming, and locomotion level  PARTICIPATION LIMITATIONS: meal prep, cleaning, driving, shopping, community activity, occupation, and yard work  PERSONAL FACTORS: Age, Fitness, Past/current experiences, and Time since onset of injury/illness/exacerbation are also affecting patient's functional outcome.   REHAB POTENTIAL: Good  CLINICAL DECISION MAKING: Evolving/moderate complexity  EVALUATION COMPLEXITY: Moderate   GOALS: Goals reviewed with patient? Yes  SHORT TERM GOALS: Target date: 11/03/2023  Patient will be independent with initial HEP. Baseline: From HHPT Goal status: MET   LONG TERM GOALS: Target date: 12/01/2023   Patient will be independent with advanced/ongoing HEP to improve outcomes and carryover.  Baseline: Not yet provided Goal status: IN PROGRESS  2.  Patient will report at least 75% improvement in R knee pain to improve QOL. Baseline: 1-2 Goal status: MET  3.  Patient will demonstrate improved R knee AROM to >/= 5-120 deg to allow for normal gait and stair mechanics. Baseline: 20-95 AROM       118 degrees; 10/29: -4-123 degrees Goal status: MET  4.  Patient will demonstrate improved functional LE strength as demonstrated by 5 x STS of </= 10 sec. Baseline: 13; 10/29: 12.2 seconds Goal status: IN PROGRESS  5.  Patient will be able to ambulate 600' with  LRAD and normal gait pattern without increased pain to access community.  Baseline: 100' with SPC Goal status: MET  6. Patient will be able to ascend/descend stairs without HR using reciprocal step pattern safely to access home and community.  Baseline: Able to ascend/descend 4 steps with 1 HR and cane reciprocally; 10/29: requires 1 HR Goal status: IN PROGRESS  7.  Patient will report 48 on LEFS (patient reported outcome measure) to demonstrate improved functional ability. Baseline: 36/80; 10/29: 62/80 Goal status: MET  PLAN:  PT FREQUENCY: 2x/week  PT DURATION: 8 weeks  PLANNED INTERVENTIONS: 97164- PT Re-evaluation, 97750- Physical Performance Testing, 97110-Therapeutic exercises, 97530- Therapeutic activity, [id number]- Neuromuscular re-education, 97535- Self Care, 02859- Manual therapy, [id number]- Gait training, [id number]- Electrical stimulation (unattended), 97016- Vasopneumatic device, [id number]- Ionotophoresis 4mg /ml Dexamethasone , 79439 (1-2 muscles), 20561 (3+ muscles)- Dry Needling, Patient/Family education, Balance training, Taping, Joint mobilization, Spinal mobilization, Cryotherapy, and Moist heat  PLAN FOR NEXT SESSION: Review HEP for form. Work on knee ROM and strength. Standing terminal knee ext. Gait training with cane indoor/outdoor/stairs   Delon DELENA Gosling, PTA, 11/22/2023, 10:21 AM

## 2023-11-24 ENCOUNTER — Ambulatory Visit: Admitting: Physical Therapy

## 2023-11-24 ENCOUNTER — Encounter: Payer: Self-pay | Admitting: Physical Therapy

## 2023-11-24 DIAGNOSIS — M25661 Stiffness of right knee, not elsewhere classified: Secondary | ICD-10-CM

## 2023-11-24 DIAGNOSIS — M25561 Pain in right knee: Secondary | ICD-10-CM

## 2023-11-24 DIAGNOSIS — M6281 Muscle weakness (generalized): Secondary | ICD-10-CM

## 2023-11-24 DIAGNOSIS — R2689 Other abnormalities of gait and mobility: Secondary | ICD-10-CM

## 2023-11-24 NOTE — Therapy (Signed)
 OUTPATIENT PHYSICAL THERAPY TREATMENT   Patient Name: RHYTHM WIGFALL MRN: [id number] DOB:08-02-1945, 78 y.o., male Today's Date: 11/24/2023  END OF SESSION:  PT End of Session - 11/24/23 0901     Visit Number 15    Number of Visits 16    Date for Recertification  12/01/23    Authorization Type [employer]    PT Start Time 0845    PT Stop Time 0944    PT Time Calculation (min) 59 min    Activity Tolerance Patient tolerated treatment well    Behavior During Therapy [facility] for tasks assessed/performed              Past Medical History:  Diagnosis Date   Arthritis    Complication of anesthesia    slow to wake up after neck surgery   Hyperlipidemia    Past Surgical History:  Procedure Laterality Date   COLONOSCOPY     NECK SURGERY  01/03/1989   TOTAL KNEE ARTHROPLASTY Right 09/22/2023   Procedure: ARTHROPLASTY, KNEE, TOTAL;  Surgeon: Vernetta Lonni GRADE, MD;  Location: [facility];  Service: Orthopedics;  Laterality: Right;   Patient Active Problem List   Diagnosis Date Noted   Status post total right knee replacement 09/22/2023   Unilateral primary osteoarthritis, right knee 09/21/2023   Spondylosis, cervical, with myelopathy 09/22/2015   Pain in joint of left shoulder 09/22/2015   Hyperlipidemia 09/22/2015   Neuropathy 09/22/2015   Vitamin D  deficiency 09/22/2015    PCP: Zollie Lowers, MD  REFERRING PROVIDER: Vernetta Lonni GRADE, MD  REFERRING DIAG: M17.11 (ICD-10-CM) - Unilateral primary osteoarthritis, right knee Z96.651 (ICD-10-CM) - Status post total right knee replacement  THERAPY DIAG:  Stiffness of right knee, not elsewhere classified  Acute pain of right knee  Muscle weakness (generalized)  Other abnormalities of gait and mobility  Rationale for Evaluation and Treatment: Rehabilitation  ONSET DATE: 09/22/23 surgery date  SUBJECTIVE:   SUBJECTIVE STATEMENT: Pt denies any pain today.   PERTINENT HISTORY: History of neck  surgery, R achilles injury  PAIN:  Are you having pain? No  PRECAUTIONS: None  RED FLAGS: None   WEIGHT BEARING RESTRICTIONS: No  FALLS:  Has patient fallen in last 6 months? 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  DIAGNOSTIC FINDINGS: n/a   PATIENT SURVEYS:  LEFS  Extreme difficulty/unable (0), Quite a bit of difficulty (1), Moderate difficulty (2), Little difficulty (3), No difficulty (4) Survey date:  10/06/23  Any of your usual work, housework or school activities 3  2. Usual hobbies, recreational or sporting activities 2  3. Getting into/out of the bath 4  4. Walking between rooms 4  5. Putting on socks/shoes 2  6. Squatting  1  7. Lifting an object, like a bag of groceries from the floor 2  8. Performing light activities around your home 2  9. Performing heavy activities around your home 2  10. Getting into/out of a car 2  11. Walking 2 blocks 2  12. Walking 1 mile 1  13. Going up/down 10 stairs (1 flight) 3  14. Standing for 1 hour 2  15.  sitting for 1 hour 2  16. Running on even ground 0  17. Running on uneven ground 0  18. Making sharp turns while running fast 0  19. Hopping  0  20. Rolling over in bed 2  Score total:  36/80     EDEMA:  Mild edema  MUSCLE LENGTH: Did not assess -- see knee ROM  POSTURE: flexed trunk   PALPATION: No unexpected tenderness to palpation  LOWER EXTREMITY ROM:  Active/Passive ROM Right eval Left eval  Hip flexion    Hip extension    Hip abduction    Hip  adduction    Hip internal rotation    Hip external rotation    Knee flexion 95/102 127/127  Knee extension -20/-9 0/0  Ankle dorsiflexion    Ankle plantarflexion    Ankle inversion    Ankle eversion     (Blank rows = not tested)  LOWER EXTREMITY MMT:  MMT Right eval Left eval  Hip flexion 4+ 5  Hip extension 4- 4  Hip abduction 3+ 4  Hip adduction    Hip internal rotation    Hip external rotation    Knee flexion 4+ 5  Knee extension 4+ 5  Ankle dorsiflexion    Ankle plantarflexion    Ankle inversion    Ankle eversion     (Blank rows = not tested)  LOWER EXTREMITY SPECIAL TESTS:  Did not assess  FUNCTIONAL TESTS:  5 times sit to stand: 13.78 sec with increased weight shift to L LE but no UE support SLS: L 43.15 sec, R 4.16 sec  GAIT: Distance walked: ~100' Assistive device utilized: tri tip cane Level of assistance: SBA Comments: R LE slightly externally rotated, R knee slightly flexed throughout gait, cane on L, diminished heel strike and hip ext during stance                                                                                                                                TREATMENT DATE:   11/24/23:                                       EXERCISE LOG  Exercise Repetitions and Resistance Comments  Recumbent bike  16 minutes   Knee ext 10# x 3.5 minutes   Ham curls 50# x 3.5 minutes   Leg Press 3.5 plates x 4 minutes   1-1 PROM, overpressure stretching and sustained right hamstring stretch x 11 minutes f/b Vasopneumatic on medium x 15 minutes.    11/22/23:                                   EXERCISE LOG  Exercise Repetitions and Resistance Comments  Recumbent bike Lvl  4 x 15 minutes   Knee ext 20# x 4 mins   Ham curls 50# x 4 mins   Leg Press 3.5 plates; seat 8;  x 4 minutes   Knee Extension Stretch 5# x 11 mins    Vasopneumatic on medium x 15 minutes.    11/15/23:                                      EXERCISE LOG  Exercise  Repetitions and Resistance Comments  Recumbent 16 minutes progressing to seat 5.   Knee ext 10# x 4.5 minutes   Ham curls 40# x 4.5 minutes   Leg press 3 plates x 4 minutes    Knee extension stretch  Zero knee with 5# overpressure x 5 minutes.   Lunges  14 box x 5 mins     11/08/23:                                   EXERCISE LOG  Exercise Repetitions and Resistance Comments  Recumbent bike Lvl 3 x 16 minutes; seat 5   Knee ext 10# x 4 minutes   Ham curls 40# x 4 minutes   Leg press 3 plates x 4 minutes   Lunges 14 box x 4 mins   Prolonged Extension Stretch Zero Knee x 4 mins   Flexion with Over pressure 4  mins     PATIENT EDUCATION:  Education details: See below. Person educated: Patient and Spouse Education method: Explanation, Demonstration, and Handouts Education comprehension: verbalized understanding, returned demonstration, and needs further education  HOME EXERCISE PROGRAM: HOME EXERCISE PROGRAM [[id number]]  PRONE KNEE HANGS -  Repeat 1 Repetition, Hold 10 Minutes, Complete 1 Set, Perform 3 Times a Day  Exercises - Seated Knee Flexion AAROM  - 1 x daily - 7 x weekly - 1 sets - 10 reps - 5 hold - Seated Hamstring Curls with Resistance  - 1 x daily - 7 x weekly - 2 sets - 10 reps - 3 sec hold - Seated Hamstring Stretch  - 1 x daily - 7 x weekly - 2 sets - 30 sec hold - Seated Active Straight-Leg Raise  - 1 x daily - 7 x weekly - 2 sets - 10 reps - Hip Abduction with Resistance Loop  - 1 x daily - 7 x weekly - 2 sets - 10 reps - 3 sec hold - Hip Extension with Resistance Loop  - 1 x daily - 7 x weekly - 2 sets - 10 reps - 3 sec hold  ASSESSMENT:  CLINICAL IMPRESSION: Patient remains very motivated.  He did very well with long hold sustained stretching to improve right knee extension.     OBJECTIVE IMPAIRMENTS: Abnormal gait, decreased activity tolerance, decreased balance, decreased endurance, decreased mobility, difficulty walking, decreased ROM, decreased strength,  hypomobility, increased edema, increased fascial restrictions, increased muscle spasms, improper body mechanics, postural dysfunction, and pain.   ACTIVITY LIMITATIONS: carrying, lifting, bending, sitting, standing, squatting, sleeping, stairs, transfers, bed mobility, bathing, toileting, hygiene/grooming, and locomotion level  PARTICIPATION LIMITATIONS: meal prep, cleaning, driving, shopping, community activity, occupation, and yard work  PERSONAL FACTORS: Age, Fitness, Past/current experiences, and Time since onset of injury/illness/exacerbation are also affecting patient's functional outcome.   REHAB POTENTIAL: Good  CLINICAL DECISION MAKING: Evolving/moderate complexity  EVALUATION COMPLEXITY: Moderate  GOALS: Goals reviewed with patient? Yes  SHORT TERM GOALS: Target date: 11/03/2023  Patient will be independent with initial HEP. Baseline: From HHPT Goal status: MET   LONG TERM GOALS: Target date: 12/01/2023   Patient will be independent with advanced/ongoing HEP to improve outcomes and carryover.  Baseline: Not yet provided Goal status: IN PROGRESS  2.  Patient will report at least 75% improvement in R knee pain to improve QOL. Baseline: 1-2 Goal status: MET  3.  Patient will demonstrate improved R knee AROM to >/= 5-120 deg to allow for normal gait and stair mechanics. Baseline: 20-95 AROM       118 degrees; 10/29: -4-123 degrees Goal status: MET  4.  Patient will demonstrate improved functional LE strength as demonstrated by 5 x STS of </= 10 sec. Baseline: 13; 10/29: 12.2 seconds Goal status: IN PROGRESS  5.  Patient will be able to ambulate 600' with LRAD and normal gait pattern without increased pain to access community.  Baseline: 100' with SPC Goal status: MET  6. Patient will be able to ascend/descend stairs without HR using reciprocal step pattern safely to access home and community.  Baseline: Able to ascend/descend 4 steps with 1 HR and cane  reciprocally; 10/29: requires 1 HR Goal status: IN PROGRESS  7.  Patient will report 48 on LEFS (patient reported outcome measure) to demonstrate improved functional ability. Baseline: 36/80; 10/29: 62/80 Goal status: MET  PLAN:  PT FREQUENCY: 2x/week  PT DURATION: 8 weeks  PLANNED INTERVENTIONS: 97164- PT Re-evaluation, 97750- Physical Performance Testing, 97110-Therapeutic exercises, 97530- Therapeutic activity, [id number]- Neuromuscular re-education, 97535- Self Care, 02859- Manual therapy, [id number]- Gait training, [phone]- Electrical stimulation (unattended), 97016- Vasopneumatic device, [id number]- Ionotophoresis 4mg /ml Dexamethasone , 79439 (1-2 muscles), 20561 (3+ muscles)- Dry Needling, Patient/Family education, Balance training, Taping, Joint mobilization, Spinal mobilization, Cryotherapy, and Moist heat  PLAN FOR NEXT SESSION: Review HEP for form. Work on knee ROM and strength. Standing terminal knee ext. Gait training with cane indoor/outdoor/stairs   Andilynn Delavega, PT, 11/24/2023, 11:32 AM

## 2023-12-04 ENCOUNTER — Ambulatory Visit: Attending: Orthopaedic Surgery

## 2023-12-04 DIAGNOSIS — M6281 Muscle weakness (generalized): Secondary | ICD-10-CM | POA: Insufficient documentation

## 2023-12-04 DIAGNOSIS — M25661 Stiffness of right knee, not elsewhere classified: Secondary | ICD-10-CM | POA: Diagnosis present

## 2023-12-04 DIAGNOSIS — R2689 Other abnormalities of gait and mobility: Secondary | ICD-10-CM | POA: Diagnosis present

## 2023-12-04 DIAGNOSIS — M25561 Pain in right knee: Secondary | ICD-10-CM | POA: Diagnosis present

## 2023-12-04 NOTE — Therapy (Signed)
 OUTPATIENT PHYSICAL THERAPY TREATMENT   Patient Name: Fernando Pratt MRN: [id number] DOB:1945/09/29, 78 y.o., male Today's Date: 12/04/2023  END OF SESSION:  PT End of Session - 12/04/23 0853     Visit Number 16    Number of Visits 16    Date for Recertification  12/01/23    Authorization Type [employer]    PT Start Time 0852    PT Stop Time 0935    PT Time Calculation (min) 43 min    Activity Tolerance Patient tolerated treatment well    Behavior During Therapy [facility] for tasks assessed/performed              Past Medical History:  Diagnosis Date   Arthritis    Complication of anesthesia    slow to wake up after neck surgery   Hyperlipidemia    Past Surgical History:  Procedure Laterality Date   COLONOSCOPY     NECK SURGERY  01/03/1989   TOTAL KNEE ARTHROPLASTY Right 09/22/2023   Procedure: ARTHROPLASTY, KNEE, TOTAL;  Surgeon: Vernetta Lonni GRADE, MD;  Location: [facility];  Service: Orthopedics;  Laterality: Right;   Patient Active Problem List   Diagnosis Date Noted   Status post total right knee replacement 09/22/2023   Unilateral primary osteoarthritis, right knee 09/21/2023   Spondylosis, cervical, with myelopathy 09/22/2015   Pain in joint of left shoulder 09/22/2015   Hyperlipidemia 09/22/2015   Neuropathy 09/22/2015   Vitamin D  deficiency 09/22/2015    PCP: Zollie Lowers, MD  REFERRING PROVIDER: Vernetta Lonni GRADE, MD  REFERRING DIAG: M17.11 (ICD-10-CM) - Unilateral primary osteoarthritis, right knee Z96.651 (ICD-10-CM) - Status post total right knee replacement  THERAPY DIAG:  Stiffness of right knee, not elsewhere classified  Acute pain of right knee  Muscle weakness (generalized)  Other abnormalities of gait and mobility  Rationale for Evaluation and Treatment: Rehabilitation  ONSET DATE: 09/22/23 surgery date  SUBJECTIVE:   SUBJECTIVE STATEMENT: Pt denies any pain today.  Ready for discharge today.  PERTINENT  HISTORY: History of neck surgery, R achilles injury  PAIN:  Are you having pain? No  PRECAUTIONS: None  RED FLAGS: None   WEIGHT BEARING RESTRICTIONS: No  FALLS:  Has patient fallen in last 6 months? 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  DIAGNOSTIC FINDINGS: n/a   PATIENT SURVEYS:  LEFS  Extreme difficulty/unable (0), Quite a bit of difficulty (1), Moderate difficulty (2), Little difficulty (3), No difficulty (4) Survey date:  10/06/23  Any of your usual work, housework or school activities 3  2. Usual hobbies, recreational or sporting activities 2  3. Getting into/out of the bath 4  4. Walking between rooms 4  5. Putting on socks/shoes 2  6. Squatting  1  7. Lifting an object, like a bag of groceries from the floor 2  8. Performing light activities around your home 2  9. Performing heavy activities around your home 2  10. Getting into/out of a car 2  11. Walking 2 blocks 2  12. Walking 1 mile 1  13. Going up/down 10  stairs (1 flight) 3  14. Standing for 1 hour 2  15.  sitting for 1 hour 2  16. Running on even ground 0  17. Running on uneven ground 0  18. Making sharp turns while running fast 0  19. Hopping  0  20. Rolling over in bed 2  Score total:  36/80     EDEMA:  Mild edema  MUSCLE LENGTH: Did not assess -- see knee ROM  POSTURE: flexed trunk   PALPATION: No unexpected tenderness to palpation  LOWER EXTREMITY ROM:  Active/Passive ROM Right eval Left eval  Hip flexion    Hip extension     Hip abduction    Hip adduction    Hip internal rotation    Hip external rotation    Knee flexion 95/102 127/127  Knee extension -20/-9 0/0  Ankle dorsiflexion    Ankle plantarflexion    Ankle inversion    Ankle eversion     (Blank rows = not tested)  LOWER EXTREMITY MMT:  MMT Right eval Left eval  Hip flexion 4+ 5  Hip extension 4- 4  Hip abduction 3+ 4  Hip adduction    Hip internal rotation    Hip external rotation    Knee flexion 4+ 5  Knee extension 4+ 5  Ankle dorsiflexion    Ankle plantarflexion    Ankle inversion    Ankle eversion     (Blank rows = not tested)  LOWER EXTREMITY SPECIAL TESTS:  Did not assess  FUNCTIONAL TESTS:  5 times sit to stand: 13.78 sec with increased weight shift to L LE but no UE support SLS: L 43.15 sec, R 4.16 sec  GAIT: Distance walked: ~100' Assistive device utilized: tri tip cane Level of assistance: SBA Comments: R LE slightly externally rotated, R knee slightly flexed throughout gait, cane on L, diminished heel strike and hip ext during stance                                                                                                                                TREATMENT DATE:   12/04/23:                                     EXERCISE LOG  Exercise Repetitions and Resistance Comments  Recumbent bike  Lvl 3 x 16 minutes   Knee ext    Ham curls    Leg Press 3.5 plates x 4 minutes   Goal Assessment See Below     11/22/23:                                   EXERCISE LOG  Exercise Repetitions and Resistance Comments  Recumbent bike Lvl 4 x 15 minutes   Knee ext 20# x 4 mins  Ham curls 50# x 4 mins   Leg Press 3.5 plates; seat 8;  x 4 minutes   Knee Extension Stretch 5# x 11 mins    Vasopneumatic on medium x 15 minutes.    11/15/23:                                      EXERCISE LOG  Exercise Repetitions and Resistance Comments  Recumbent 16 minutes progressing to seat 5.   Knee ext 10# x 4.5 minutes    Ham curls 40# x 4.5 minutes   Leg press 3 plates x 4 minutes    Knee extension stretch  Zero knee with 5# overpressure x 5 minutes.   Lunges  14 box x 5 mins     11/08/23:                                   EXERCISE LOG  Exercise Repetitions and Resistance Comments  Recumbent bike Lvl 3 x 16 minutes; seat 5   Knee ext 10# x 4 minutes   Ham curls 40# x 4 minutes   Leg press 3 plates x 4 minutes   Lunges 14 box x 4 mins   Prolonged Extension Stretch Zero Knee x 4 mins   Flexion with Over pressure 4  mins     PATIENT EDUCATION:  Education details: See below. Person educated: Patient and Spouse Education method: Explanation, Demonstration, and Handouts Education comprehension: verbalized understanding, returned demonstration, and needs further education  HOME EXERCISE PROGRAM: HOME EXERCISE PROGRAM [[id number]]  PRONE KNEE HANGS -  Repeat 1 Repetition, Hold 10 Minutes, Complete 1 Set, Perform 3 Times a Day  Exercises - Seated Knee Flexion AAROM  - 1 x daily - 7 x weekly - 1 sets - 10 reps - 5 hold - Seated Hamstring Curls with Resistance  - 1 x daily - 7 x weekly - 2 sets - 10 reps - 3 sec hold - Seated Hamstring Stretch  - 1 x daily - 7 x weekly - 2 sets - 30 sec hold - Seated Active Straight-Leg Raise  - 1 x daily - 7 x weekly - 2 sets - 10 reps - Hip Abduction with Resistance Loop  - 1 x daily - 7 x weekly - 2 sets - 10 reps - 3 sec hold - Hip Extension with Resistance Loop  - 1 x daily - 7 x weekly - 2 sets - 10 reps - 3 sec hold  ASSESSMENT:  CLINICAL IMPRESSION: Pt arrives for today's treatment session denying any pain.  Pt able to perform 5 STS test in 9.89 seconds, meeting his LTG.  Pt also able to navigate four stairs with reciprocal pattern and without use of handrail.  Pt has met all of the goals set forth by physical therapy at this time.  Pt encouraged to call the facility with any questions or concerns.  Normal responses to vaso noted upon removal.  Pt denied any  pain at completion of today's treatment session.  Pt ready for discharge at this time.    OBJECTIVE IMPAIRMENTS: Abnormal gait, decreased activity tolerance, decreased balance, decreased endurance, decreased mobility, difficulty walking, decreased ROM, decreased strength, hypomobility, increased edema, increased fascial restrictions, increased muscle spasms, improper body mechanics, postural dysfunction, and pain.   ACTIVITY LIMITATIONS: carrying, lifting, bending, sitting, standing, squatting,  sleeping, stairs, transfers, bed mobility, bathing, toileting, hygiene/grooming, and locomotion level  PARTICIPATION LIMITATIONS: meal prep, cleaning, driving, shopping, community activity, occupation, and yard work  PERSONAL FACTORS: Age, Fitness, Past/current experiences, and Time since onset of injury/illness/exacerbation are also affecting patient's functional outcome.   REHAB POTENTIAL: Good  CLINICAL DECISION MAKING: Evolving/moderate complexity  EVALUATION COMPLEXITY: Moderate   GOALS: Goals reviewed with patient? Yes  SHORT TERM GOALS: Target date: 11/03/2023  Patient will be independent with initial HEP. Baseline: From [facility]PT Goal status: MET   LONG TERM GOALS: Target date: 12/01/2023   Patient will be independent with advanced/ongoing HEP to improve outcomes and carryover.  Baseline: Not yet provided Goal status: MET  2.  Patient will report at least 75% improvement in R knee pain to improve QOL. Baseline: 1-2 Goal status: MET  3.  Patient will demonstrate improved R knee AROM to >/= 5-120 deg to allow for normal gait and stair mechanics. Baseline: 20-95 AROM       118 degrees; 10/29: -4-123 degrees Goal status: MET  4.  Patient will demonstrate improved functional LE strength as demonstrated by 5 x STS of </= 10 sec. Baseline: 13; 10/29: 12.2 seconds; 12/1: 9.89 seconds Goal status: MET  5.  Patient will be able to ambulate 600' with LRAD and normal gait pattern without  increased pain to access community.  Baseline: 100' with SPC Goal status: MET  6. Patient will be able to ascend/descend stairs without HR using reciprocal step pattern safely to access home and community.  Baseline: Able to ascend/descend 4 steps with 1 HR and cane reciprocally; 10/29: requires 1 HR; 12/1:  Goal status: IN PROGRESS  7.  Patient will report 48 on LEFS (patient reported outcome measure) to demonstrate improved functional ability. Baseline: 36/80; 10/29: 62/80 Goal status: MET  PLAN:  PT FREQUENCY: 2x/week  PT DURATION: 8 weeks  PLANNED INTERVENTIONS: 97164- PT Re-evaluation, 97750- Physical Performance Testing, 97110-Therapeutic exercises, 97530- Therapeutic activity, [id number]- Neuromuscular re-education, 97535- Self Care, [id number]- Manual therapy, [id number]- Gait training, [phone]- Electrical stimulation (unattended), 97016- Vasopneumatic device, [id number]- Ionotophoresis 4mg /ml Dexamethasone , 79439 (1-2 muscles), 20561 (3+ muscles)- Dry Needling, Patient/Family education, Balance training, Taping, Joint mobilization, Spinal mobilization, Cryotherapy, and Moist heat  PLAN FOR NEXT SESSION: Review HEP for form. Work on knee ROM and strength. Standing terminal knee ext. Gait training with cane indoor/outdoor/stairs   Delon DELENA Gosling, PTA, 12/04/2023, 9:52 AM

## 2024-01-08 ENCOUNTER — Other Ambulatory Visit: Payer: Self-pay | Admitting: Family Medicine

## 2024-01-09 ENCOUNTER — Encounter: Payer: Self-pay | Admitting: Family Medicine

## 2024-01-09 NOTE — Telephone Encounter (Signed)
 Stacks NTBS Last OV 11/29/22 NO RF sent to pharmacy last OV greater than a year

## 2024-01-09 NOTE — Telephone Encounter (Signed)
 Left message that pt needs to make appt

## 2024-02-12 ENCOUNTER — Ambulatory Visit: Admitting: Orthopaedic Surgery
